# Patient Record
Sex: Male | Born: 1990 | Race: Black or African American | Hispanic: No | Marital: Single | Smoking: Current every day smoker
Health system: Southern US, Community
[De-identification: ages and names within clinical notes are randomized; demographics above are authoritative.]

## PROBLEM LIST (undated history)

## (undated) DIAGNOSIS — F419 Anxiety disorder, unspecified: Secondary | ICD-10-CM

## (undated) DIAGNOSIS — F1910 Other psychoactive substance abuse, uncomplicated: Secondary | ICD-10-CM

## (undated) DIAGNOSIS — F32A Depression, unspecified: Secondary | ICD-10-CM

## (undated) HISTORY — DX: Anxiety disorder, unspecified: F41.9

## (undated) HISTORY — DX: Depression, unspecified: F32.A

## (undated) HISTORY — DX: Other psychoactive substance abuse, uncomplicated: F19.10

---
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
## 2010-12-22 LAB — COMPREHENSIVE METABOLIC PANEL
ALT: 15 U/L (ref 0–53)
AST: 19 U/L (ref 0–37)
Albumin: 4.7 g/dL (ref 3.5–5.2)
Alkaline Phosphatase: 63 U/L (ref 39–117)
BUN: 10 mg/dL (ref 6–23)
CO2: 30 mEq/L (ref 19–32)
Calcium: 9.7 mg/dL (ref 8.4–10.5)
Chloride: 102 mEq/L (ref 96–112)
Creatinine, Ser: 1.17 mg/dL (ref 0.4–1.5)
GFR calc Af Amer: >60 mL/min (ref >60)
GFR calc non Af Amer: >60 mL/min (ref >60)
Glucose, Bld: 97 mg/dL (ref 70–99)
Potassium: 3.2 mEq/L — ABNORMAL LOW (ref 3.5–5.1)
Sodium: 140 mEq/L (ref 135–145)
Total Bilirubin: 1.5 mg/dL — ABNORMAL HIGH (ref 0.3–1.2)
Total Protein: 7.7 g/dL (ref 6.0–8.3)

## 2010-12-22 LAB — CBC
HCT: 49.3 % (ref 39.0–52.0)
Hemoglobin: 17.2 g/dL — ABNORMAL HIGH (ref 13.0–17.0)
MCHC: 34.9 g/dL (ref 30.0–36.0)
MCV: 94.4 fL (ref 78.0–100.0)
Platelets: 200 10*3/uL (ref 150–400)
RBC: 5.22 MIL/uL (ref 4.22–5.81)
RDW: 12.8 % (ref 11.5–15.5)
WBC: 5.7 10*3/uL (ref 4.0–10.5)

## 2010-12-22 LAB — TSH: TSH: 1.221 u[IU]/mL (ref 0.350–4.500)

## 2011-01-25 NOTE — H&P (Signed)
NAMEDEONTAE, ROBSON NO.:  [id number]   MEDICAL RECORD NO.:  [id number]          PATIENT TYPE:  IPS   LOCATION:  0508                          FACILITY:  [facility]   PHYSICIAN:  Anselm Jungling, MD  DATE OF BIRTH:  [birth year]   DATE OF ADMISSION:  12/25/2008  DATE OF DISCHARGE:                       PSYCHIATRIC ADMISSION ASSESSMENT   This is a voluntary admission to the services of Dr. Geralyn Flash.   This is an 20 year old single [place] American male.  He had his first  session with a therapist on Thursday.  During his therapy session, he  reported that he had been suffering from depression ever since he had to  leave his college due to a concussion from a football injury back in  October.  He had now developed suicidal ideation, to include a plan to  hang himself.  Apparently, the therapist recommended that he come over  here for an assessment, and it was felt that it would be in his best  interest to be admitted.  He does readily admit to using marijuana.  He  denies any other drug use.   PAST PSYCHIATRIC HISTORY:  He does not have any.   SOCIAL HISTORY:  He was a Land at [employer] up in  [place], due to a concussion suffered on October 15th.  He had to leave  school, quit playing football.  He is now enrolled at [employer].  He reports  that he is unfocused, dizzy, and he feels that it is from his  concussion.   FAMILY HISTORY:  The maternal side of his family, all people have  depression.   ALCOHOL/DRUG HISTORY:  He admits to occasional alcohol on weekends and  uses marijuana.   PRIMARY CARE Aquarius Tremper:  Deirdre Peer. Polite, M.D.   MEDICAL PROBLEMS:  He reports post-concussive syndrome.   MEDICATIONS:  He does not have any.   DRUG ALLERGIES:  No known drug allergies.   PHYSICAL EXAMINATION:  A well-developed, well-nourished 20 year old  male.  VITAL SIGNS ON ADMISSION:  He is 68 inches tall.  He weighs 158.  Temperature is 97.5, blood  pressure 134/87, pulse 55.   He does not have any positives in review of systems other than feeling  dizzy and unfocused since his concussion.  He is now depressed about not  being able to play football.   MENTAL STATUS EXAM:  He is alert and oriented.  He is appropriately  groomed, dressed, and nourished.  His speech has a normal rate, rhythm,  and tone.  His mood is appropriate to the situation.  His affect had a  full range.  Thought processes are fairly clear, rational, and goal-  oriented, although he is requesting discharge.  Judgment and insight are  fair.  Constitution and memory are intact.  Intelligence is average.  He  is not actively suicidal at this moment.  He is not homicidal.  He has  not had auditory or visual hallucinations.   DIAGNOSES:   AXIS I:  Major depressive disorder, single, severe, without psychotic  features.   AXIS II:  Deferred.   AXIS III:  Status post concussion, October 15th.  He has had other ones,  but this one was apparently the worst.   AXIS IV:  Severe.   AXIS V:  20.   PLAN:  Admit for safety and stabilization to assess the need for  medication and to set up post-discharge support regarding therapy and  medications.   ESTIMATED LENGTH OF STAY:  3 to 5 days.      Mickie Leonarda Salon, P.A.-C.      Anselm Jungling, MD  Electronically Signed    MD/MEDQ  D:  12/26/2008  T:  12/27/2008  Job:  [id number]

## 2011-01-28 NOTE — Discharge Summary (Signed)
Xavier Ramsey, Xavier Ramsey NO.:  [id number]   MEDICAL RECORD NO.:  [id number]          PATIENT TYPE:  IPS   LOCATION:  0508                          FACILITY:  [facility]   PHYSICIAN:  Jasmine Pang, M.D. DATE OF BIRTH:  November 20, 1990   DATE OF ADMISSION:  12/25/2008  DATE OF DISCHARGE:  12/27/2008                               DISCHARGE SUMMARY   IDENTIFICATION:  This is an 20 year old single African American male,  who was admitted on a voluntary basis on December 25, 2008.   HISTORY OF PRESENT ILLNESS:  The patient had his first session with a  therapist on the day before admission.  During his therapy session, he  reported he had been suffering from depression ever since he had to  leave his college due to a concussion from a football injury back in  October 2009.  He had now developed suicidal ideation including a plan  to hang himself.  The therapist recommended that he come over to our  hospital for assessment and admission.  For further admission  information, see psychiatric admission assessment.   HOSPITAL COURSE:  Upon admission, the patient was ordered a CBC without  differential.  This was within normal limits except for hemoglobin  elevated at 17.2.  Routine chemistry profile was within normal limits  except for potassium of 3.2.  Hepatic profile was within normal limits  except for slightly elevated total bilirubin of 1.5 (0.3-1.2).  TSH was  1.221 (0.35-4.5).  In individual sessions, the patient was a well-  developed, well-nourished male, who was alert and oriented x4.  He was  pleasant, polite, reserved.  He admits to suicidal ideation prior to  admission.  He denied this now.  He stated he was depressed and wanted  help.  He indicated it was okay to contact his mother.  His mother was  concerned about who the patient was going to see once discharged.  She  felt he would do better with a male therapist.  She seemed very  supportive.  On December 27, 2008, mental  status had improved markedly from  admission status.  Sleep was good.  Appetite was good.  Mood was  euthymic.  Affect consistent with mood, wide range with no suicidal or  homicidal ideation.  No thoughts of self-injurious behavior.  No  auditory or visual hallucinations.  No paranoia or delusions.  Thoughts  were logical and goal-directed.  Thought content, no predominant theme.  Cognitive was grossly intact.  Insight good.  Judgment good.  Impulse  control good.  The patient had a family session with his parents today.  Main concerns addressed were discharge planning.  The patient's father  is visiting from out of town and agreed to stay as long as needed to  help offer extra support to the patient.  Both parents were very  supportive and felt comfortable with him going home today.  The patient  stated he did not feel suicidal.  Family did not feel he would harm  himself or others upon returning home.   DISCHARGE DIAGNOSES:  Axis I:  Major depressive  disorder, single, severe  without psychotic features.  Axis II:  No diagnosis.  Axis III:  Status post concussion on June 26, 2008.  Axis IV:  Severe (problems with primary support group, other  psychosocial problems, burden of psychiatric illness, burden of  concussion).  Axis V:  Global assessment of functioning was 50 upon discharge.  Global  assessment of functioning was 20 upon admission.  Global assessment of  functioning highest past year was 65.   DISCHARGE PLAN:  There was no specific activity level or dietary  restrictions.   POSTHOSPITAL CARE PLANS:  The patient will see Vivi Martens at [facility] on December 29, 2008.   DISCHARGE MEDICATIONS:  None.      Jasmine Pang, M.D.  Electronically Signed     BHS/MEDQ  D:  01/14/2009  T:  01/15/2009  Job:  [id number]

## 2012-06-04 ENCOUNTER — Ambulatory Visit: Payer: Self-pay | Admitting: Licensed Clinical Social Worker

## 2012-06-25 ENCOUNTER — Ambulatory Visit (INDEPENDENT_AMBULATORY_CARE_PROVIDER_SITE_OTHER): Payer: 59 | Admitting: Licensed Clinical Social Worker

## 2012-06-25 DIAGNOSIS — F4323 Adjustment disorder with mixed anxiety and depressed mood: Secondary | ICD-10-CM

## 2014-05-27 ENCOUNTER — Other Ambulatory Visit: Payer: Self-pay | Admitting: Internal Medicine

## 2014-05-27 DIAGNOSIS — R1310 Dysphagia, unspecified: Secondary | ICD-10-CM

## 2014-05-29 ENCOUNTER — Ambulatory Visit
Admission: RE | Admit: 2014-05-29 | Discharge: 2014-05-29 | Disposition: A | Discharge status: Home / Self Care | Payer: 59 | Source: Ambulatory Visit | Attending: Internal Medicine | Admitting: Internal Medicine

## 2014-05-29 DIAGNOSIS — R1310 Dysphagia, unspecified: Secondary | ICD-10-CM

## 2014-08-26 ENCOUNTER — Ambulatory Visit (INDEPENDENT_AMBULATORY_CARE_PROVIDER_SITE_OTHER): Payer: 59 | Admitting: Licensed Clinical Social Worker

## 2014-08-26 DIAGNOSIS — F331 Major depressive disorder, recurrent, moderate: Secondary | ICD-10-CM

## 2014-09-02 ENCOUNTER — Ambulatory Visit (INDEPENDENT_AMBULATORY_CARE_PROVIDER_SITE_OTHER): Payer: 59 | Admitting: Licensed Clinical Social Worker

## 2014-09-02 DIAGNOSIS — F331 Major depressive disorder, recurrent, moderate: Secondary | ICD-10-CM

## 2014-09-16 ENCOUNTER — Ambulatory Visit: Payer: 59 | Admitting: Licensed Clinical Social Worker

## 2015-03-30 IMAGING — RF DG ESOPHAGUS
14 of 17 series · 19 of 24 positions shown · non-contrast
Comparison: None.

CLINICAL DATA: Dysphagia

EXAM:
ESOPHOGRAM / BARIUM SWALLOW / BARIUM TABLET STUDY
TECHNIQUE: Combined double contrast and single contrast examination performed
using effervescent crystals, thick barium liquid, and thin barium
liquid. The patient was observed with fluoroscopy swallowing a 13mm
barium sulphate tablet.
FLUOROSCOPY TIME:  1 min 18 seconds

[Series 2: run · 1 of 1 slices shown (1 of 14)]
[im 1/1]
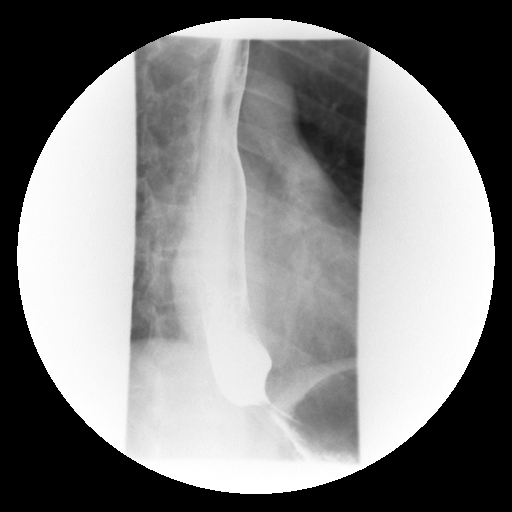

[Series 3: run · 1 of 1 slices shown (2 of 14)]
[im 1/1]
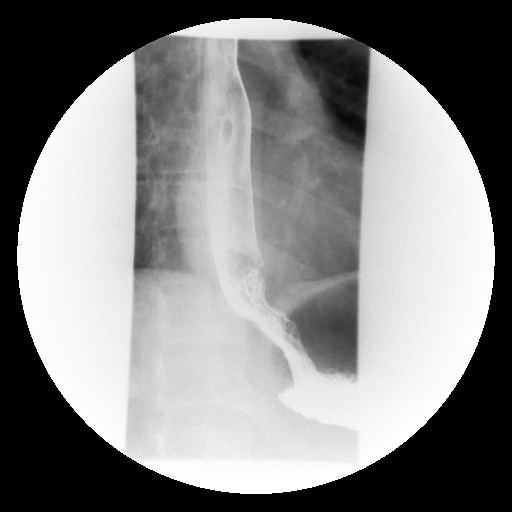

[Series 5: run · 1 of 1 slices shown (3 of 14)]
[im 1/1]
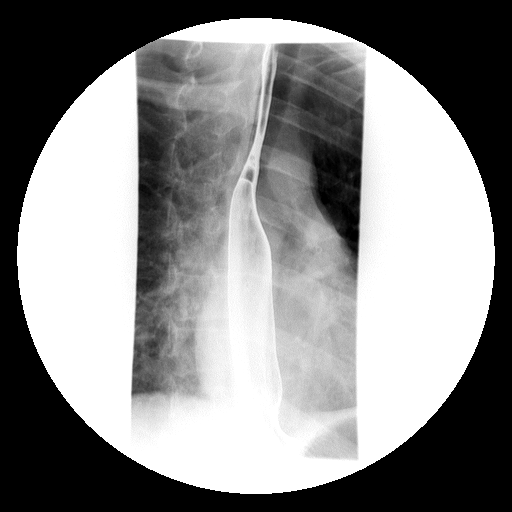

[Series 6: run · 1 of 1 slices shown (4 of 14)]
[im 1/1]
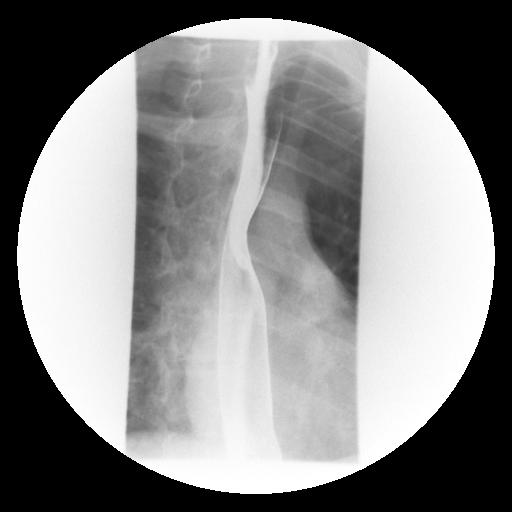

[Series 7: run · 1 of 1 slices shown (5 of 14)]
[im 1/1]
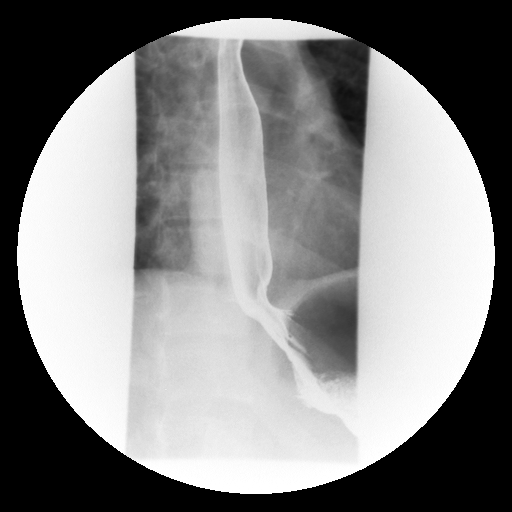

[Series 8: run · 2 of 8 slices shown (6 of 14)]
[im 1/8]
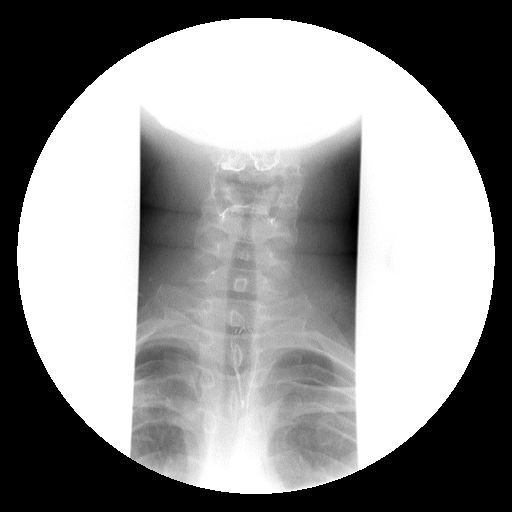
[im 8/8]
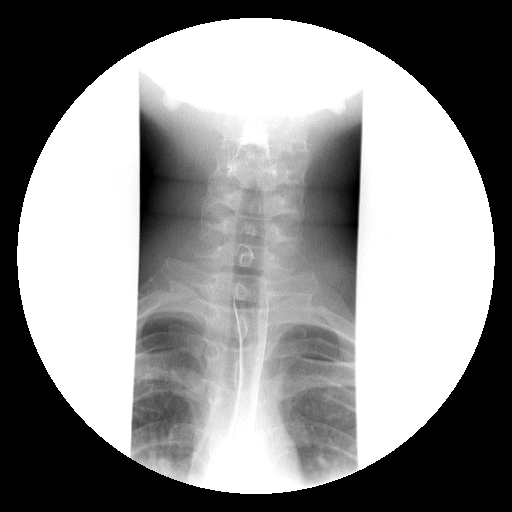

[Series 9: run · 2 of 7 slices shown (7 of 14)]
[im 1/7]
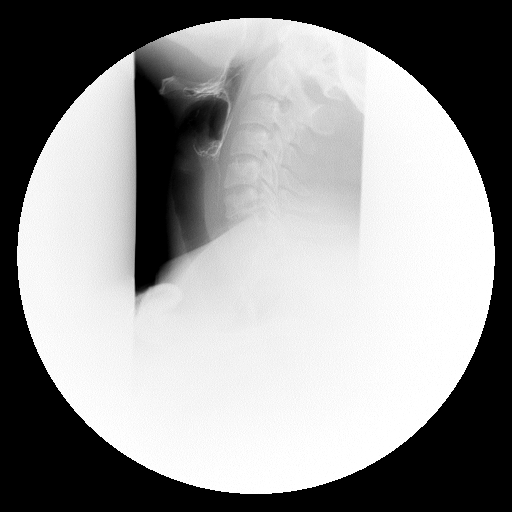
[im 4/7]
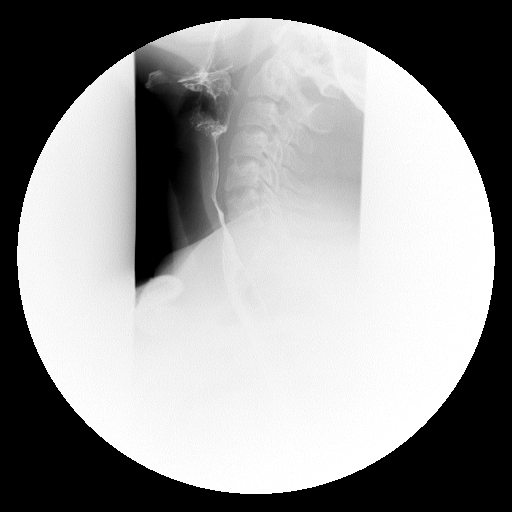

[Series 10: run · 4 of 8 slices shown (8 of 14)]
[im 1/8]
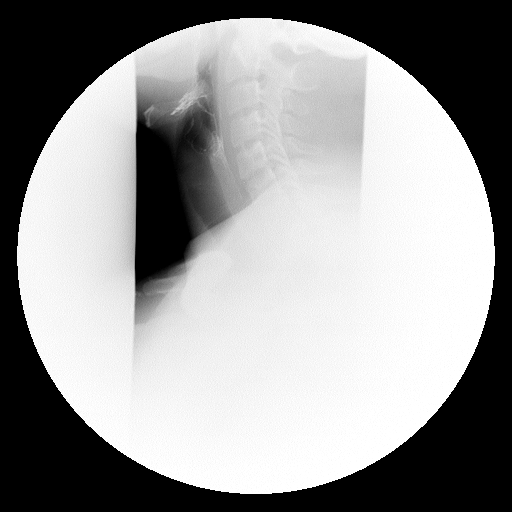
[im 3/8]
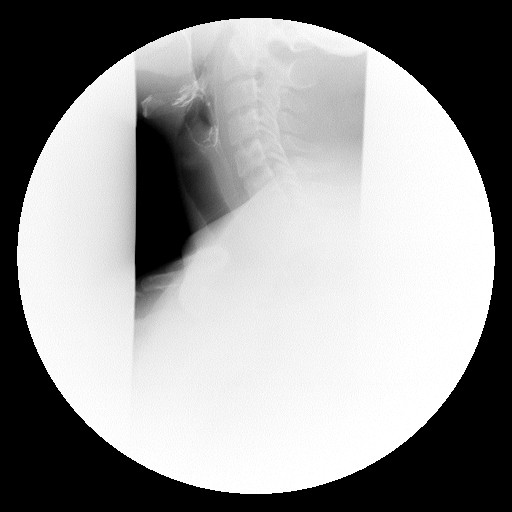
[im 5/8]
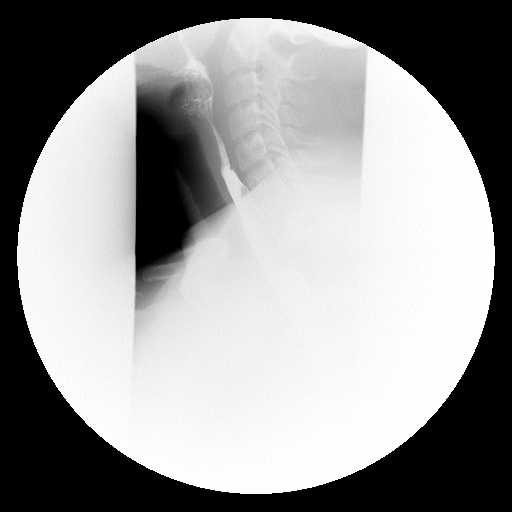
[im 8/8]
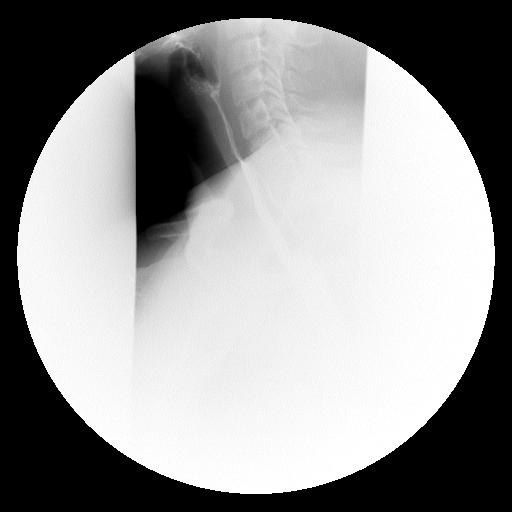

[Series 12: run · 1 of 1 slices shown (9 of 14)]
[im 1/1]
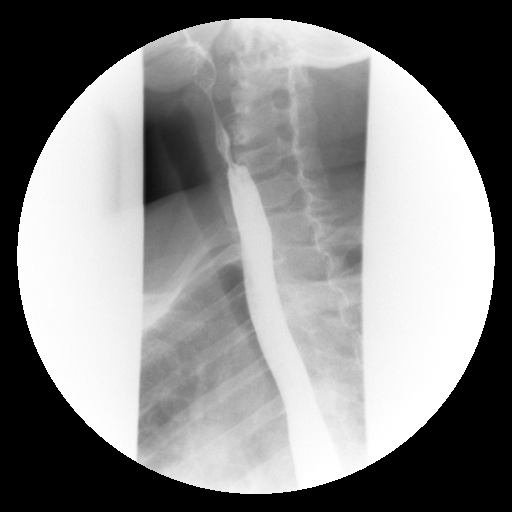

[Series 13: run · 1 of 1 slices shown (10 of 14)]
[im 1/1]
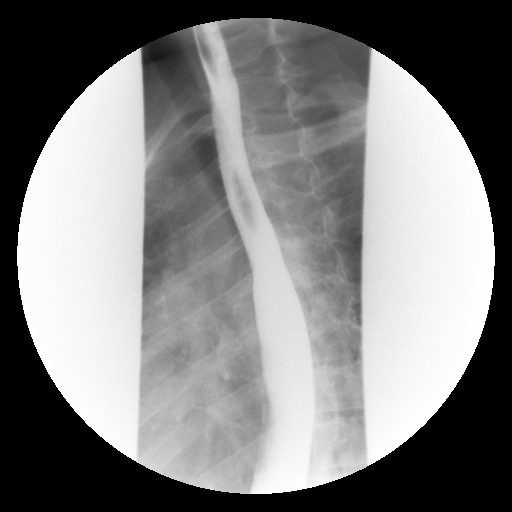

[Series 14: run · 1 of 1 slices shown (11 of 14)]
[im 1/1]
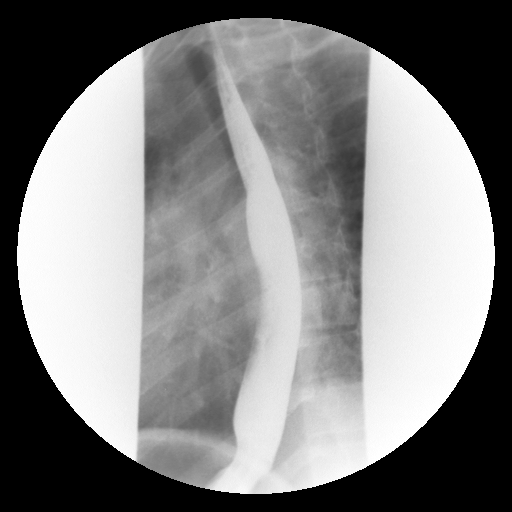

[Series 15: run · 1 of 1 slices shown (12 of 14)]
[im 1/1]
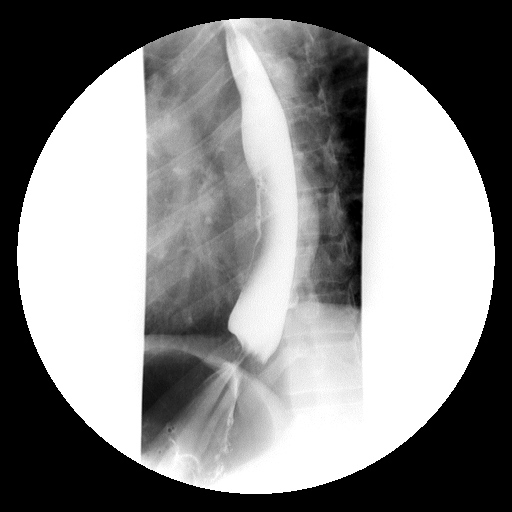

[Series 18: run · 1 of 1 slices shown (13 of 14)]
[im 1/1]
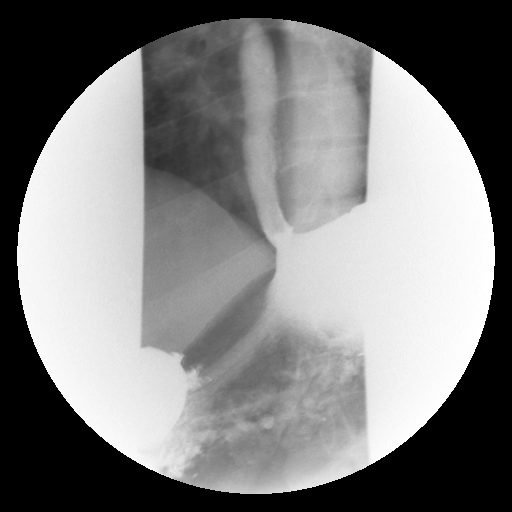

[Series 19: run · 1 of 1 slices shown (14 of 14)]
[im 1/1]
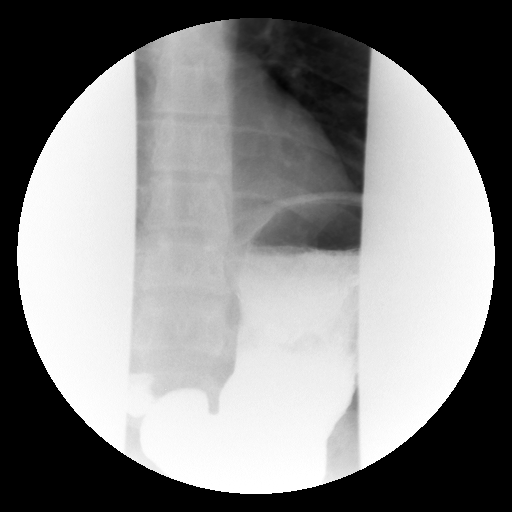

[19 of 24 positions shown; findings below may reference images not displayed]

FINDINGS: Initially a double-contrast barium swallow was performed. The mucosa
of the esophagus is unremarkable. A single contrast study shows the
swallowing mechanism to be normal. There is slight prominence of the
cricopharyngeus muscle noted. There is very minimal indentation upon
the lower cervical esophagus from the left of midline which could be
normal, but a left thyroid nodule or asymmetrical enlargement could
create a similar appearance.

Esophageal peristalsis is normal. No hiatal hernia is seen. Moderate
gastroesophageal reflux is demonstrated. A barium pill was given at
the end of the study which did pass into the stomach without delay.
IMPRESSION: 1. Moderate gastroesophageal reflux. No hiatal hernia. Barium pill
passes into the stomach without delay.
2. Slight prominence of the cricopharyngeus muscle.
3. Very mild indentation upon the lower cervical esophagus from the
left. Cannot exclude asymmetrical enlargement of the left lobe of
thyroid versus left thyroid nodule. Correlate clinically.

## 2020-10-21 ENCOUNTER — Ambulatory Visit (INDEPENDENT_AMBULATORY_CARE_PROVIDER_SITE_OTHER): Payer: Self-pay | Admitting: Internal Medicine

## 2020-10-21 ENCOUNTER — Other Ambulatory Visit: Payer: Self-pay

## 2020-10-21 VITALS — BP 130/84 | HR 78 | Temp 98.3°F | Resp 16 | Ht 69.0 in | Wt 160.0 lb

## 2020-10-21 DIAGNOSIS — Z7689 Persons encountering health services in other specified circumstances: Secondary | ICD-10-CM

## 2020-10-21 DIAGNOSIS — Z114 Encounter for screening for human immunodeficiency virus [HIV]: Secondary | ICD-10-CM

## 2020-10-21 DIAGNOSIS — Z87820 Personal history of traumatic brain injury: Secondary | ICD-10-CM

## 2020-10-21 DIAGNOSIS — Z13228 Encounter for screening for other metabolic disorders: Secondary | ICD-10-CM

## 2020-10-21 DIAGNOSIS — R413 Other amnesia: Secondary | ICD-10-CM

## 2020-10-21 DIAGNOSIS — Z130 Encounter for screening for diseases of the blood and blood-forming organs and certain disorders involving the immune mechanism: Secondary | ICD-10-CM

## 2020-10-21 DIAGNOSIS — R2689 Other abnormalities of gait and mobility: Secondary | ICD-10-CM

## 2020-10-21 DIAGNOSIS — Z1159 Encounter for screening for other viral diseases: Secondary | ICD-10-CM

## 2020-10-21 NOTE — Progress Notes (Signed)
Subjective:    Xavier Ramsey - 30 y.o. male MRN [id number]  Date of birth: [birth year]  HPI  Xavier Ramsey is to establish care. Reports was previously going to [facility] but has not been in several years. Patient has a PMH significant for none. No surgical history. No medication use.   Reports that had several concussions between 2006-2009 from playing football. Endorses significant memory issues---forgets tasks he is supposed. Has trouble with balance problems. No focal weakness, trouble with speech. No headaches.    ROS per HPI   Health Maintenance:  Health Maintenance Due  Topic Date Due   Hepatitis C Screening  Never done    Past Medical History: There are no problems to display for this patient.   Social History   reports that he has never smoked. He has never used smokeless tobacco. He reports current alcohol use. He reports current drug use. Drug: Marijuana.   Family History  family history is not on file.   Medications: reviewed and updated   Objective:   Physical Exam BP 130/84 (BP Location: Right Arm, Patient Position: Sitting, Cuff Size: Normal)    Pulse 78    Temp 98.3 F (36.8 C) (Oral)    Resp 16    Ht 5\' 9"  (1.753 m)    Wt 160 lb (72.6 kg)    SpO2 98%    BMI 23.63 kg/m  Physical Exam Constitutional:      General: He is not in acute distress.    Appearance: He is not diaphoretic.  HENT:     Head: Normocephalic and atraumatic.     Mouth/Throat:     Mouth: Mucous membranes are moist.     Pharynx: No oropharyngeal exudate.  Eyes:     Extraocular Movements: Extraocular movements intact and EOM normal.     Conjunctiva/sclera: Conjunctivae normal.     Pupils: Pupils are equal, round, and reactive to light.  Cardiovascular:     Rate and Rhythm: Normal rate and regular rhythm.     Heart sounds: Normal heart sounds. No murmur heard.   Pulmonary:     Effort: Pulmonary effort is normal. No respiratory distress.     Breath sounds: Normal breath sounds.   Musculoskeletal:        General: Normal range of motion.  Skin:    General: Skin is warm and dry.  Neurological:     Mental Status: He is alert and oriented to person, place, and time.     Cranial Nerves: No cranial nerve deficit.     Motor: No weakness.     Gait: Gait normal.  Psychiatric:        Mood and Affect: Affect normal.        Judgment: Judgment normal.         Assessment & Plan:   1. Encounter to establish care Reviewed patient's PMH, social history, surgical history, and medications.   2. Screening for metabolic disorder - Comprehensive metabolic panel - Lipid panel  3. Screening for deficiency anemia - CBC  4. Need for hepatitis C screening test - Hepatitis C antibody  5. Screening for HIV (human immunodeficiency virus) - HIV Antibody (routine testing w rflx)  6. H/O multiple concussions 7. Balance problem 8. Memory loss Patient with multiple concussions related to injuries while playing contact sports. Only prior brain imaging available for review is CT head 2006 which was negative. Given his history and sequale of symptoms, am concerned for TBI. Will refer to neurology for  further work up. Would suspect warrants additional imaging. Neuro exam is benign and non-acute at visit today.  - Ambulatory referral to Neurology     Phill Myron, D.O. 10/21/2020, 3:03 PM Primary Care at [facility]

## 2020-10-22 LAB — LIPID PANEL
Chol/HDL Ratio: 2.7 ratio (ref 0.0–5.0)
Cholesterol, Total: 182 mg/dL (ref 100–199)
HDL: 67 mg/dL (ref >39)
LDL Chol Calc (NIH): 88 mg/dL (ref 0–99)
Triglycerides: 158 mg/dL — ABNORMAL HIGH (ref 0–149)
VLDL Cholesterol Cal: 27 mg/dL (ref 5–40)

## 2020-10-22 LAB — CBC
Hematocrit: 44.1 % (ref 37.5–51.0)
Hemoglobin: 15.5 g/dL (ref 13.0–17.7)
MCH: 33.8 pg — ABNORMAL HIGH (ref 26.6–33.0)
MCHC: 35.1 g/dL (ref 31.5–35.7)
MCV: 96 fL (ref 79–97)
Platelets: 286 10*3/uL (ref 150–450)
RBC: 4.58 x10E6/uL (ref 4.14–5.80)
RDW: 12.2 % (ref 11.6–15.4)
WBC: 6 10*3/uL (ref 3.4–10.8)

## 2020-10-22 LAB — COMPREHENSIVE METABOLIC PANEL
ALT: 39 IU/L (ref 0–44)
AST: 31 IU/L (ref 0–40)
Albumin/Globulin Ratio: 1.7 (ref 1.2–2.2)
Albumin: 4.5 g/dL (ref 4.1–5.2)
Alkaline Phosphatase: 65 IU/L (ref 44–121)
BUN/Creatinine Ratio: 5 — ABNORMAL LOW (ref 9–20)
BUN: 7 mg/dL (ref 6–20)
Bilirubin Total: 0.7 mg/dL (ref 0.0–1.2)
CO2: 26 mmol/L (ref 20–29)
Calcium: 9.3 mg/dL (ref 8.7–10.2)
Chloride: 102 mmol/L (ref 96–106)
Creatinine, Ser: 1.31 mg/dL — ABNORMAL HIGH (ref 0.76–1.27)
GFR calc Af Amer: 84 mL/min/{1.73_m2} (ref >59)
GFR calc non Af Amer: 73 mL/min/{1.73_m2} (ref >59)
Globulin, Total: 2.7 g/dL (ref 1.5–4.5)
Glucose: 72 mg/dL (ref 65–99)
Potassium: 4.5 mmol/L (ref 3.5–5.2)
Sodium: 145 mmol/L — ABNORMAL HIGH (ref 134–144)
Total Protein: 7.2 g/dL (ref 6.0–8.5)

## 2020-10-22 LAB — HIV ANTIBODY (ROUTINE TESTING W REFLEX): HIV Screen 4th Generation wRfx: NONREACTIVE

## 2020-10-22 LAB — HEPATITIS C ANTIBODY: Hep C Virus Ab: <0.1 s/co ratio (ref 0.0–0.9)

## 2020-10-26 ENCOUNTER — Other Ambulatory Visit: Payer: Self-pay

## 2020-10-26 NOTE — Progress Notes (Signed)
Erroneous encounter

## 2020-10-27 ENCOUNTER — Encounter: Payer: Self-pay | Admitting: Psychology

## 2020-11-12 ENCOUNTER — Encounter (HOSPITAL_COMMUNITY): Payer: Self-pay | Admitting: Psychiatry

## 2020-11-12 ENCOUNTER — Other Ambulatory Visit: Payer: Self-pay

## 2020-11-12 ENCOUNTER — Ambulatory Visit (INDEPENDENT_AMBULATORY_CARE_PROVIDER_SITE_OTHER): Payer: No Payment, Other | Admitting: Psychiatry

## 2020-11-12 VITALS — BP 121/94 | HR 78 | Ht 67.5 in | Wt 164.0 lb

## 2020-11-12 DIAGNOSIS — F0630 Mood disorder due to known physiological condition, unspecified: Secondary | ICD-10-CM | POA: Diagnosis not present

## 2020-11-12 DIAGNOSIS — S0990XS Unspecified injury of head, sequela: Secondary | ICD-10-CM | POA: Insufficient documentation

## 2020-11-12 MED ORDER — CITALOPRAM HYDROBROMIDE 20 MG PO TABS: ORAL_TABLET | ORAL | 1 refills | Status: DC

## 2020-11-12 NOTE — Progress Notes (Signed)
Psychiatric Initial Adult Assessment   Patient Identification: Xavier Ramsey MRN:  [id number] Date of Evaluation:  11/12/2020   Referral Source: Self  Chief Complaint:  " My mom thinks I have problems with my mood."  Visit Diagnosis:    ICD-10-CM   1. 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  Patient reported that when he was in high school he was active in contact sports and suffered several concussions while playing football.  He stated that he had a very severe concussion when he was a senior in high school and he stopped playing completely around 2009.  He stated that when he was younger he was informed that he should not be playing anymore because of the severity of the numerous concussions he had suffered. Patient reported over the past few years his mother has noted that patient is more irritable and moody at times.  He stated that she feels that he gets impatient easily and loses his temper. He stated that since he graduated from high school he has noticed that over time he has started to develop memory issues as he cannot remember things.  He also has hard time completing tasks in a timely fashion. He stated that he has noted lately that he cries easily for no reason.  He sometimes may feel sad but denied feeling overly depressed.  He reported that sometimes he feels tired but denied feeling loss of energy. He reported feeling distracted and having difficulty with concentration.  He has been eating well.  He has a hard time falling asleep. He denied having any suicidal ideations.  He denied any prior suicide attempts. He denied any symptoms suggestive of mania or hypomania. He denied any history of road rages or aggressive outburst resulting in legal involvement. He denied any history of aggressive impulsivity.  He  was evaluated by his PCP last month and was referred to neurology due to history of numerous concussions in the past.  Patient stated that his mother wanted him to be seen by psychiatry due to his mood changes.  Based on his assessment he displays mild irritability and frequent crying spells. He does not meet criteria for pseudobulbar affect.  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  He was agreeable to trial of low-dose of citalopram to target his mood. Potential side effects of medication and risks vs benefits of treatment vs non-treatment were explained and discussed. All questions were answered.    Past Psychiatric History: denied  Previous Psychotropic Medications: No   Substance Abuse History in the last 12 months:  No.  Consequences of Substance Abuse: NA  Past Medical History:  Past Medical History:  Diagnosis Date  . Anxiety    Phreesia 10/21/2020  . Substance abuse ([place])    Phreesia 10/21/2020     Family Psychiatric History: denied   Family History: No family history on file.  Social History:   Social History   Socioeconomic History  . Marital status: Single    Spouse name: Not on file  . Number of children: Not on file  . Years of education: Not on file  . Highest education level: Not on file  Occupational History  . Not on file  Tobacco Use  . Smoking status: Never Smoker  . Smokeless  tobacco: Never Used  Vaping Use  . Vaping Use: Never used  Substance and Sexual Activity  . Alcohol use: Yes    Comment: weekends  . Drug use: Yes    Types: Marijuana  . Sexual activity: Not on file  Other Topics Concern  . Not on file  Social History Narrative  . Not on file   Social Determinants of Health   Financial Resource Strain: Not on file  Food Insecurity: Not on file  Transportation Needs: Not on file  Physical Activity: Not on file  Stress: Not on file  Social Connections: Not on file     Additional Social History: Lives with girlfriend, 60-year-old son and girlfriend's child.  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  Allergies:  No Known Allergies  Metabolic Disorder Labs: No results found for: HGBA1C, MPG No results found for: PROLACTIN Lab Results  Component Value Date   CHOL 182 10/21/2020   TRIG 158 (H) 10/21/2020   HDL 67 10/21/2020   CHOLHDL 2.7 10/21/2020   LDLCALC 88 10/21/2020     Therapeutic Level Labs: No results found for: LITHIUM No results found for: CBMZ No results found for: VALPROATE  Current Medications: Current Outpatient Medications  Medication Sig Dispense Refill  . citalopram (CELEXA) 20 MG tablet Take half tablet daily for 1 week then take 1 tablet daily 30 tablet 1   No current facility-administered medications for this visit.    Musculoskeletal: Strength & Muscle Tone: within normal limits Gait & Station: normal Patient leans: N/A  Psychiatric Specialty Exam: Review of Systems  Blood pressure (!) 121/94, pulse 78, height 5' 7.5" (1.715 m), weight 164 lb (74.4 kg), SpO2 100 %.Body mass index is 25.31 kg/m.  General Appearance: Fairly Groomed  Eye Contact:  Good  Speech:  Clear and Coherent and Normal Rate  Volume:  Normal  Mood:  Euthymic  Affect:  Congruent  Thought Process:  Goal Directed and Descriptions of Associations: Intact  Orientation:  Full (Time, Place, and Person)  Thought Content:  Logical  Suicidal Thoughts:  No  Homicidal Thoughts:  No  Memory:  Immediate;   Good Recent;   Good  Judgement:  Fair  Insight:  Fair  Psychomotor Activity:  Normal  Concentration:  Concentration: Good and Attention Span: Good  Recall:  Good  Fund of Knowledge:Good  Language: Good  Akathisia:  Negative  Handed:  Right  AIMS (if indicated):  0  Assets:  Communication Skills [employer] [place] Talents/Skills Transportation Vocational/Educational  ADL's:  Intact  Cognition:  WNL  Sleep:  Fair   Screenings: GAD-7   [place] Office Visit from 10/21/2020 in Primary Care at [facility]  Total GAD-7 Score 0    PHQ2-9   [place] Office Visit from 11/12/2020 in [facility] Office Visit from 10/21/2020 in Primary Care at [facility] Total Score 0 0  PHQ-9 Total Score 4 0    Flowsheet Row Office Visit from 11/12/2020 in [place] No Risk      Assessment and Plan: Based on patient's history and evaluation he meets her here for mood disorder secondary to old head injury (concussions).  He is agreeable to trying a low-dose of citalopram to help with his mood. Potential side effects of medication and risks vs benefits of treatment vs non-treatment were explained and discussed. All questions were answered.   1. Mood disorder due to old  head injury  - citalopram (CELEXA) 20 MG tablet; Take half tablet daily for 1 week then take 1 tablet daily  Dispense: 30 tablet; Refill: 1   Follow-up in 2 months. Patient has been referred to neurology by PCP.  He was advised to keep that appointment.  [place]ada Crane, MD 3/3/20221:47 PM

## 2020-12-03 ENCOUNTER — Ambulatory Visit (HOSPITAL_COMMUNITY): Payer: No Payment, Other | Admitting: Psychiatry

## 2020-12-15 ENCOUNTER — Encounter: Payer: Self-pay | Admitting: Psychology

## 2020-12-16 ENCOUNTER — Ambulatory Visit: Payer: Self-pay

## 2020-12-21 ENCOUNTER — Ambulatory Visit: Payer: Self-pay | Attending: Internal Medicine

## 2020-12-21 ENCOUNTER — Other Ambulatory Visit: Payer: Self-pay

## 2020-12-22 ENCOUNTER — Encounter: Payer: Self-pay | Admitting: Psychology

## 2020-12-28 ENCOUNTER — Ambulatory Visit (INDEPENDENT_AMBULATORY_CARE_PROVIDER_SITE_OTHER): Payer: Self-pay | Admitting: Primary Care

## 2020-12-30 ENCOUNTER — Ambulatory Visit: Payer: Self-pay | Attending: Internal Medicine

## 2020-12-30 ENCOUNTER — Ambulatory Visit: Payer: Medicaid Other

## 2020-12-30 ENCOUNTER — Other Ambulatory Visit: Payer: Self-pay

## 2021-01-01 ENCOUNTER — Telehealth: Payer: Self-pay | Admitting: Internal Medicine

## 2021-01-01 NOTE — Telephone Encounter (Signed)
I return call, I LVM inform that I received all the documents on 12/30/20 that will take 3 weeks minimus to be review his application I can't tell them exaltly the day since is [facility] who review the application

## 2021-01-01 NOTE — Telephone Encounter (Signed)
Copied from [place] [id number]. Topic: General - Other >> Jan 01, 2021  9:04 AM Tessa Lerner A wrote: Reason for CRM: Patient's mother would like to speak with Mr. Clifton James from financial counseling when possible  Patient's mother would like to know the exact approval date so that an appointment for the patient can be made  Patient's mother had additional questions related to billing as well.  Please contact to further advise when possible

## 2021-01-07 ENCOUNTER — Ambulatory Visit (HOSPITAL_COMMUNITY): Payer: No Payment, Other | Admitting: Psychiatry

## 2021-01-11 ENCOUNTER — Telehealth: Payer: Self-pay | Admitting: Internal Medicine

## 2021-01-11 NOTE — Telephone Encounter (Signed)
Copied from [place] [id number]. Topic: General - Other >> Jan 11, 2021  8:56 AM Keene Breath wrote: Reason for CRM: Patient's mother called and has a few questions for [facility] regarding financial assistance.  Please call to discuss further [phone] [facility])

## 2021-01-12 ENCOUNTER — Other Ambulatory Visit: Payer: Self-pay

## 2021-01-12 ENCOUNTER — Encounter: Payer: Self-pay | Admitting: Family

## 2021-01-12 ENCOUNTER — Ambulatory Visit (INDEPENDENT_AMBULATORY_CARE_PROVIDER_SITE_OTHER): Payer: Self-pay | Admitting: Family

## 2021-01-12 VITALS — BP 133/90 | HR 67 | Wt 160.0 lb

## 2021-01-12 DIAGNOSIS — T22111A Burn of first degree of right forearm, initial encounter: Secondary | ICD-10-CM

## 2021-01-12 DIAGNOSIS — D229 Melanocytic nevi, unspecified: Secondary | ICD-10-CM

## 2021-01-12 MED ORDER — BACITRACIN 500 UNIT/GM EX OINT: 1.0000 "application " | TOPICAL_OINTMENT | Freq: Two times a day (BID) | CUTANEOUS | 0 refills | Status: DC

## 2021-01-12 NOTE — Progress Notes (Signed)
Mole on left side of stomach start off small now larger tender to touch

## 2021-01-12 NOTE — Patient Instructions (Addendum)
Referral to Dermatology.   Antibiotic ointment for burn.   Follow-up with primary provider as needed.  Mole A mole is a colored (pigmented) growth on the skin. Moles are very common. They are usually harmless, but some moles can become cancerous over time. What are the causes? Moles are caused when pigmented skin cells grow together in clusters instead of spreading out in the skin as they normally do. The reason why the skin cells grow together in clusters is not known. What increases the risk? You are more likely to develop a mole if you:  Have family members who have moles.  Are white.  Have blond hair.  Are often outdoors and exposed to the sun.  Received phototherapy when you were a newborn baby.  Are male. What are the signs or symptoms? A mole may be:  [place] or black.  Flat or raised.  Smooth or wrinkled.   How is this diagnosed? A mole is diagnosed with a skin exam. If your health care provider thinks a mole may be cancerous, all or part of the mole will be removed for testing (biopsy). How is this treated? Most moles are noncancerous (benign) and do not require treatment. If a mole is found to be cancerous, it will be removed. You may also choose to have a mole removed if it is causing pain or if you do not like the way it looks. Follow these instructions at home: General instructions  Every month, look for new moles and check your existing moles for changes. This is important because a change in a mole can mean that the mole has become cancerous.  ABCDE changes in a mole indicate that you should be evaluated by your health care provider. ABCDE stands for: ? Asymmetry. This means the mole has an irregular shape. It is not round or oval. ? Border. This means the mole has an irregular or bumpy border. ? Color. This means the mole has multiple colors in it, including brown, black, blue, red, or tan. Note that it is normal for moles to get darker when a woman is  pregnant or takes birth control pills. ? Diameter. This means the mole is more than 0.2 inches (6 mm) across. ? Evolving. This refers to any unusual changes or symptoms in the mole, such as pain, itching, stinging, sensitivity, or bleeding.  If you have a large number of moles, see a skin doctor (dermatologist) at least one time every year for a full-body skin check.   Lifestyle  When you are outdoors, wear sunscreen with SPF 30 (sun protection factor 30) or higher.  Use an adequate amount of sunscreen to cover exposed areas of skin. Put it on 30 minutes before you go out. Reapply it every 2 hours or anytime you come out of the water.  When you are out in the sun, wear a broad-brimmed hat and clothing that covers your arms and legs. Wear wraparound sunglasses.   Contact a health care provider if:  The size, shape, borders, or color of your mole changes.  Your mole, or the skin near the mole, becomes painful, sore, red, or swollen.  Your mole: ? Develops more than one color. ? Itches or bleeds. ? Becomes scaly, sheds skin, or oozes fluid. ? Becomes flat or develops raised areas. ? Becomes hard or soft.  You develop a new mole. Summary  A mole is a colored (pigmented) growth on the skin. Moles are very common. They are usually harmless, but some  moles can become cancerous over time.  Every month, look for new moles and check your existing moles for changes. This is important because a change in a mole can mean that the mole has become cancerous.  If you have a large number of moles, see a skin doctor (dermatologist) at least one time every year for a full-body skin check.  When you are outdoors, wear sunscreen with SPF 30 (sun protection factor 30) or higher. Reapply it every 2 hours or anytime you come out of the water.  Contact a health care provider if you notice changes in a mole or if you develop a new mole. This information is not intended to replace advice given to you by  your health care provider. Make sure you discuss any questions you have with your health care provider. Document Revised: 04/04/2019 Document Reviewed: 01/23/2018 Elsevier Patient Education  [place].

## 2021-01-12 NOTE — Progress Notes (Signed)
Patient ID: Xavier Ramsey, male    DOB: 1991-05-05  MRN: [id number]  CC: Mole Growth  Subjective: Xavier Ramsey is a 30 y.o. male who presents for mole growth. He is accompanied by his Mother.  His concerns today include:   1. MOLE GROWTH: Duration: at least 1 year Location: left upper abdomen Painful: yes  Itching: yes  Onset: gradual Context: bigger Associated signs and symptoms:  History of skin cancer: no  History of precancerous skin lesions: no Family history of skin cancer: no   Comments: Does squeeze and will not pop.   2. ARM BURN: Reports yesterday hit his arm while taking food out of oven. Immediately began to blister. Put aloe vera on it which helped some. Wrapped in gauze and ace bandage.  Patient Active Problem List   Diagnosis Date Noted  . 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 citalopram (CELEXA) 20 MG tablet Take half tablet daily for 1 week then take 1 tablet daily 30 tablet 1   No current facility-administered medications on file prior to visit.    No Known Allergies  Social History   Socioeconomic History  . Marital status: Single    Spouse name: Not on file  . Number of children: Not on file  . Years of education: Not on file  . Highest education level: Not on file  Occupational History  . Not on file  Tobacco Use  . Smoking status: Never Smoker  . Smokeless tobacco: Never Used  Vaping Use  . Vaping Use: Never used  Substance and Sexual Activity  . Alcohol use: Yes    Comment: weekends  . Drug use: Yes    Types: Marijuana  . Sexual activity: Not on file  Other Topics Concern  . Not on file  Social History Narrative  . Not on file   Social Determinants of Health   Financial Resource Strain: Not on file  Food Insecurity: Not on file  Transportation Needs: Not on file  Physical Activity: Not on file  Stress: Not on file   Social Connections: Not on file  Intimate Partner Violence: Not on file    Family History  Problem Relation Age of Onset  . Hypertension Mother     History reviewed. No pertinent surgical history.  ROS: Review of Systems Negative except as stated above  PHYSICAL EXAM: BP 133/90 (BP Location: Left Arm, Patient Position: Sitting)   Pulse 67   Wt 160 lb (72.6 kg)   SpO2 98%   BMI 24.69 kg/m   Physical Exam HENT:     Head: Normocephalic and atraumatic.  Eyes:     Extraocular Movements: Extraocular movements intact.     Conjunctiva/sclera: Conjunctivae normal.     Pupils: Pupils are equal, round, and reactive to light.  Cardiovascular:     Rate and Rhythm: Normal rate and regular rhythm.     Pulses: Normal pulses.     Heart sounds: Normal heart sounds.  Pulmonary:     Effort: Pulmonary effort is normal.     Breath sounds: Normal breath sounds.  Musculoskeletal:        General: Normal range of motion.     Cervical back: Normal range of motion and neck supple.  Skin:    Findings: Lesion present.     Comments: Superficial burn with fluid filled blister right forearm. Concern for abnormal  mole growth left upper abdomen.   Neurological:     General: No focal deficit present.     Mental Status: He is alert and oriented to person, place, and time.  Psychiatric:        Mood and Affect: Mood normal.        Behavior: Behavior normal.       ASSESSMENT AND PLAN: 1. Atypical mole: - Left upper abdomen present for at least 1 year and growing since then. - Referral to Dermatology for further evaluation and management.  - Ambulatory referral to Dermatology  2. Superficial burn of right forearm, initial encounter: - Bacitracin ointment as prescribed.  - Follow-up with primary provider as scheduled.  - bacitracin 500 UNIT/GM ointment; Apply 1 application topically 2 (two) times daily.  Dispense: 15 g; Refill: 0    Patient was given the opportunity to ask questions.   Patient verbalized understanding of the plan and was able to repeat key elements of the plan. Patient was given clear instructions to go to Emergency Department or return to medical center if symptoms don't improve, worsen, or new problems develop.The patient verbalized understanding.   Orders Placed This Encounter  Procedures  . Ambulatory referral to Dermatology     Requested Prescriptions   Signed Prescriptions Disp Refills  . bacitracin 500 UNIT/GM ointment 15 g 0    Sig: Apply 1 application topically 2 (two) times daily.    Follow-up with primary provider as scheduled.   Camillia Herter, NP

## 2021-01-13 NOTE — Telephone Encounter (Signed)
I return Pt mom call, she was explain again the the [facility] program does not paid for her son medical bill is the [facility] discount those, at this time we still waiting for the approval

## 2021-01-25 ENCOUNTER — Telehealth: Payer: Self-pay | Admitting: Internal Medicine

## 2021-01-25 ENCOUNTER — Ambulatory Visit: Payer: Self-pay | Admitting: Internal Medicine

## 2021-01-25 NOTE — Telephone Encounter (Signed)
Pt's mom Barkley Boards called asking to speak with a nurse or a provider to ask some questions about the neurology referral for the pt.

## 2021-01-25 NOTE — Telephone Encounter (Signed)
Called pt mother answered/ PCE-Pt signed DPR authorizing all [facility] to verbally disclose any medical information to [facility] (parent). Clarify with mother the Neurology referal. Stated she will call and make the appt ASAP

## 2021-01-26 ENCOUNTER — Telehealth: Payer: Self-pay | Admitting: Internal Medicine

## 2021-01-26 NOTE — Telephone Encounter (Signed)
Patient mother called in requesting referral for dentist. Patient mother stated patient just received OC and is needing a routine cleaning and check-up. States it has been a long time since patient has been to dentist. Please advise.

## 2021-01-28 ENCOUNTER — Other Ambulatory Visit: Payer: Self-pay | Admitting: Internal Medicine

## 2021-01-28 DIAGNOSIS — Z9189 Other specified personal risk factors, not elsewhere classified: Secondary | ICD-10-CM

## 2021-01-28 NOTE — Telephone Encounter (Signed)
Dental referral placed.   Phill Myron, D.O. Primary Care at [facility]  01/28/2021, 11:16 AM

## 2021-02-23 ENCOUNTER — Ambulatory Visit: Payer: Medicaid Other | Admitting: Family

## 2021-03-09 NOTE — Progress Notes (Signed)
Patient did not show for appointment.   

## 2021-03-10 ENCOUNTER — Encounter: Payer: Medicaid Other | Admitting: Family

## 2021-03-23 ENCOUNTER — Encounter: Payer: Medicaid Other | Admitting: Psychology

## 2021-04-13 ENCOUNTER — Encounter: Payer: Medicaid Other | Admitting: Psychology

## 2021-05-20 ENCOUNTER — Telehealth: Payer: Self-pay | Admitting: Internal Medicine

## 2021-05-20 NOTE — Telephone Encounter (Signed)
I return Pt mother call, LVM to call back

## 2021-05-20 NOTE — Telephone Encounter (Signed)
Copied from [place] [id number]. Topic: General - Call Back - No Documentation >> May 20, 2021 12:32 PM Erick Blinks wrote: Barnett Abu for CRM: Pt's mother Barkley Boards is requesting to speak to [place] regarding questions about his financial aid assistance.   Best contact: [phone]

## 2021-05-21 ENCOUNTER — Telehealth: Payer: Self-pay | Admitting: Internal Medicine

## 2021-05-21 NOTE — Telephone Encounter (Signed)
Pt mother call, froward the info to [facility] to handle the billing question

## 2021-05-21 NOTE — Telephone Encounter (Signed)
Request was route to [facility]

## 2021-05-21 NOTE — Telephone Encounter (Signed)
Pts mother called in to return [place] call, but he was unavailable at the moment and she asked if he could give her a call back. Please advise.

## 2021-06-03 NOTE — Progress Notes (Signed)
Patient ID: Xavier Ramsey, male    DOB: 1991-04-25  MRN: [id number]  CC: Abdominal Pain   Subjective: Xavier Ramsey is a 30 y.o. male who presents for abdominal pain.   His concerns today include:  Reports ongoing for several months. Endorses nausea, vomiting, decreased appetite, and fatigue. Reports smoking marijuana to stimulate appetite. Unable to identify triggers causing abdominal pain and nausea.   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   No current facility-administered medications on file prior to visit.    No Known Allergies  Social History   Socioeconomic History   Marital status: Single    Spouse name: Not on file   Number of children: Not on file   Years of education: Not on file   Highest education level: Not on file  Occupational History   Not on file  Tobacco Use   Smoking status: Never   Smokeless tobacco: Never  Vaping Use   Vaping Use: Never used  Substance and Sexual Activity   Alcohol use: Yes    Alcohol/week: 2.0 standard drinks    Types: 2 Shots of liquor per week    Comment: weekends   Drug use: Yes    Types: Marijuana   Sexual activity: Not on file  Other Topics Concern   Not on file  Social History Narrative   Not on file   Social Determinants of Health   Financial Resource Strain: Not on file  Food Insecurity: Not on file  Transportation Needs: Not on file  Physical Activity: Not on file  Stress: Not on file  Social Connections: Not on file  Intimate Partner Violence: Not on file    Family History  Problem Relation Age of Onset   Hypertension Mother     No past surgical history on file.  ROS: Review of Systems Negative except as stated above  PHYSICAL EXAM: BP (!) 134/96 (BP Location: Left Arm, Patient Position: Sitting, Cuff Size: Normal)   Pulse 74   Temp 98.1 F (36.7 C)   Resp 16    Ht 5' 9.02" (1.753 m)   Wt 156 lb 3.2 oz (70.9 kg)   SpO2 97%   BMI 23.06 kg/m    Physical Exam HENT:     Head: Normocephalic and atraumatic.  Eyes:     Extraocular Movements: Extraocular movements intact.     Conjunctiva/sclera: Conjunctivae normal.     Pupils: Pupils are equal, round, and reactive to light.  Cardiovascular:     Rate and Rhythm: Normal rate and regular rhythm.     Pulses: Normal pulses.     Heart sounds: Normal heart sounds.  Pulmonary:     Effort: Pulmonary effort is normal.     Breath sounds: Normal breath sounds.  Abdominal:     General: Bowel sounds are normal.     Palpations: Abdomen is soft.  Musculoskeletal:     Cervical back: Normal range of motion and neck supple.  Neurological:     General: No focal deficit present.     Mental Status: He is alert and oriented to person, place, and time.  Psychiatric:        Mood and Affect: Mood normal.        Behavior: Behavior normal.   ASSESSMENT AND PLAN: 1. Generalized abdominal pain: 2. Loss of appetite: 3. Fatigue, unspecified type:  4. Nausea and vomiting, intractability of vomiting not specified, unspecified vomiting type: - Ultrasound abdomen for further evaluation.  - H Pylori screening.  - CBC to screen for anemia. - CMP14+EGFR to check kidney function, liver function, and electrolyte balance.  - Begin Ondansetron for nausea.  - Counseled that cannabis may be contributing to nausea and vomiting.  - Referral to Gastroenterology for further evaluation and management.  - Follow-up with primary provider as scheduled.  - Ambulatory referral to Gastroenterology - H Pylori, IGM, IGG, IGA AB - CBC - CMP14+EGFR - US Abdomen Complete; Future - ondansetron (ZOFRAN) 4 MG tablet; Take 1 tablet (4 mg total) by mouth every 8 (eight) hours as needed for nausea or vomiting.  Dispense: 20 tablet; Refill: 1    Patient was given the opportunity to ask questions.  Patient verbalized understanding of the plan  and was able to repeat key elements of the plan. Patient was given clear instructions to go to Emergency Department or return to medical center if symptoms don't improve, worsen, or new problems develop.The patient verbalized understanding.   Orders Placed This Encounter  Procedures   US Abdomen Complete   H Pylori, IGM, IGG, IGA AB   CBC   CMP14+EGFR   Ambulatory referral to Gastroenterology   POCT URINALYSIS DIP (CLINITEK)     Requested Prescriptions   Signed Prescriptions Disp Refills   ondansetron (ZOFRAN) 4 MG tablet 20 tablet 1    Sig: Take 1 tablet (4 mg total) by mouth every 8 (eight) hours as needed for nausea or vomiting.    Follow-up with primary provider as scheduled.  Camillia Herter, NP

## 2021-06-04 ENCOUNTER — Encounter: Payer: Self-pay | Admitting: Family

## 2021-06-04 ENCOUNTER — Other Ambulatory Visit: Payer: Self-pay

## 2021-06-04 ENCOUNTER — Ambulatory Visit (INDEPENDENT_AMBULATORY_CARE_PROVIDER_SITE_OTHER): Payer: Self-pay | Admitting: Family

## 2021-06-04 VITALS — BP 134/96 | HR 74 | Temp 98.1°F | Resp 16 | Ht 69.02 in | Wt 156.2 lb

## 2021-06-04 DIAGNOSIS — R630 Anorexia: Secondary | ICD-10-CM

## 2021-06-04 DIAGNOSIS — R1084 Generalized abdominal pain: Secondary | ICD-10-CM

## 2021-06-04 DIAGNOSIS — R112 Nausea with vomiting, unspecified: Secondary | ICD-10-CM

## 2021-06-04 DIAGNOSIS — R5383 Other fatigue: Secondary | ICD-10-CM

## 2021-06-04 MED ORDER — ONDANSETRON HCL 4 MG PO TABS: 4.0000 mg | ORAL_TABLET | Freq: Three times a day (TID) | ORAL | 1 refills | Status: DC | PRN

## 2021-06-04 NOTE — Progress Notes (Signed)
Pt presents for stomach issues  pt reports loss of appetite, fatigue, nausea, vomiting being going on for few months

## 2021-06-07 LAB — CBC
Hematocrit: 39 % (ref 37.5–51.0)
Hemoglobin: 13.7 g/dL (ref 13.0–17.7)
MCH: 34.4 pg — ABNORMAL HIGH (ref 26.6–33.0)
MCHC: 35.1 g/dL (ref 31.5–35.7)
MCV: 98 fL — ABNORMAL HIGH (ref 79–97)
Platelets: 246 10*3/uL (ref 150–450)
RBC: 3.98 x10E6/uL — ABNORMAL LOW (ref 4.14–5.80)
RDW: 13.2 % (ref 11.6–15.4)
WBC: 5 10*3/uL (ref 3.4–10.8)

## 2021-06-07 LAB — CMP14+EGFR
ALT: 45 IU/L — ABNORMAL HIGH (ref 0–44)
AST: 61 IU/L — ABNORMAL HIGH (ref 0–40)
Albumin/Globulin Ratio: 2.1 (ref 1.2–2.2)
Albumin: 4.1 g/dL (ref 4.0–5.0)
Alkaline Phosphatase: 81 IU/L (ref 44–121)
BUN/Creatinine Ratio: 11 (ref 9–20)
BUN: 12 mg/dL (ref 6–20)
Bilirubin Total: 0.6 mg/dL (ref 0.0–1.2)
CO2: 21 mmol/L (ref 20–29)
Calcium: 9 mg/dL (ref 8.7–10.2)
Chloride: 106 mmol/L (ref 96–106)
Creatinine, Ser: 1.1 mg/dL (ref 0.76–1.27)
Globulin, Total: 2 g/dL (ref 1.5–4.5)
Glucose: 85 mg/dL (ref 65–99)
Potassium: 4.1 mmol/L (ref 3.5–5.2)
Sodium: 144 mmol/L (ref 134–144)
Total Protein: 6.1 g/dL (ref 6.0–8.5)
eGFR: 92 mL/min/{1.73_m2} (ref >59)

## 2021-06-07 LAB — H PYLORI, IGM, IGG, IGA AB
H pylori, IgM Abs: <9 units (ref 0.0–8.9)
H. pylori, IgA Abs: <9 units (ref 0.0–8.9)
H. pylori, IgG AbS: 0.26 Index Value (ref 0.00–0.79)

## 2021-06-08 ENCOUNTER — Other Ambulatory Visit: Payer: Self-pay | Admitting: Family

## 2021-06-08 DIAGNOSIS — Z130 Encounter for screening for diseases of the blood and blood-forming organs and certain disorders involving the immune mechanism: Secondary | ICD-10-CM

## 2021-06-08 NOTE — Progress Notes (Signed)
H. Pylori negative.   Adding iron panel to further screen for anemia.   Kidney function normal.   AST and ALT both elevated. These are enzymes used to check liver function. Encouraged to recheck at lab only appointment in 4 to 6 weeks.

## 2021-06-18 ENCOUNTER — Telehealth: Payer: Self-pay | Admitting: Family

## 2021-06-18 NOTE — Telephone Encounter (Signed)
Patients mother Meredith Mody is calling to get a call about the lab results from his 06/04/2021 office visit. She is also stating when the patient was here he did not let the provider know about his insomnia and anxiety. She is asking for a call to discuss that as well and wanting to know if something can be prescribed. Mom is asking for a call back [phone]. Mom states patient is having physical and mental issues lately.

## 2021-06-23 ENCOUNTER — Other Ambulatory Visit: Payer: Self-pay | Admitting: Family

## 2021-06-23 ENCOUNTER — Telehealth: Payer: Self-pay | Admitting: *Deleted

## 2021-06-23 DIAGNOSIS — R748 Abnormal levels of other serum enzymes: Secondary | ICD-10-CM

## 2021-06-23 NOTE — Telephone Encounter (Signed)
Hepatic function panel ordered.   Please offer patient appointment with me in regards to sleep medication.

## 2021-06-23 NOTE — Telephone Encounter (Signed)
Patient mother (on [place]) is calling to inquire about: 1- lab reults from last OV in September.  Informed of results and also informed the results were received via MyChart.   2. Sleep medication for patient.    Noted in result note patient will need additional labs for iron and LFT's. Please place order for LFT's

## 2021-06-29 NOTE — Telephone Encounter (Signed)
Patient mother aware that PCP prefers to discuss as an OV. Aware of pending labs.  Given number to [facility] billing. Patient is w/o insurance.

## 2021-06-30 ENCOUNTER — Ambulatory Visit: Payer: Medicaid Other | Admitting: Family Medicine

## 2021-07-02 NOTE — Progress Notes (Signed)
Virtual Visit via Telephone Note  I connected with Xavier Ramsey, on 07/06/2021 at 3:25 PM by telephone due to the COVID-19 pandemic and verified that I am speaking with the correct person using two identifiers.  Due to current restrictions/limitations of in-office visits due to the COVID-19 pandemic, this scheduled clinical appointment was converted to a telehealth visit.   Consent: I discussed the limitations, risks, security and privacy concerns of performing an evaluation and management service by telephone and the availability of in person appointments. I also discussed with the patient that there may be a patient responsible charge related to this service. The patient expressed understanding and agreed to proceed.   Location of Patient: Home  Location of Provider: [facility] [place] participating in Telemedicine visit: LANDAN FEDIE Durene Fruits, NP [place], [place]  History of Present Illness: Xavier Ramsey is a 30 y.o. male who presents for anxiety insomnia.   Concerns and/or issues: Reports anxiety began in 2009 after a concussion. In the past took Xanax and helped. Reports sleep pattern off and in the past took Ambien which helped. Requesting refills.   Depression screen [facility] 2/9 07/06/2021 01/12/2021 10/21/2020  Decreased Interest 0 1 0  Down, Depressed, Hopeless 2 1 0  PHQ - 2 Score 2 2 0  Altered sleeping 3 1 0  Tired, decreased energy 3 1 0  Change in appetite 3 1 0  Feeling bad or failure about yourself  0 1 0  Trouble concentrating 3 1 0  Moving slowly or fidgety/restless 0 1 0  Suicidal thoughts 0 1 0  PHQ-9 Score 14 9 0  Difficult doing work/chores - Somewhat difficult Not difficult at all  Some encounter information is confidential and restricted. Go to Review Flowsheets activity to see all data.     Past Medical History:  Diagnosis Date   Anxiety    [employer]rees[employer] 10/21/2020   Substance abuse ([place])    [employer]reesia  10/21/2020   No Known Allergies  Current Outpatient Medications on File Prior to Visit  Medication Sig Dispense Refill   ondansetron (ZOFRAN) 4 MG tablet Take 1 tablet (4 mg total) by mouth every 8 (eight) hours as needed for nausea or vomiting. (Patient not taking: Reported on 07/06/2021) 20 tablet 1   No current facility-administered medications on file prior to visit.    Observations/Objective: Alert and oriented x 3. Not in acute distress. Physical examination not completed as this is a telemedicine visit.  Assessment and Plan: 1. Anxiety and depression: - Patient denies thoughts of self-harm, suicidal ideations, homicidal ideations. - Counseled patient I do not prescribe Alprazolam and Zolpidem. Patient verbalized understanding.  - Referral to Psychiatry for further evaluation and management.  - Follow-up with primary provider as scheduled.  - Ambulatory referral to Psychiatry   Follow Up Instructions: Follow-up with primary provider as scheduled. Referral to Psychiatry.   Patient was given clear instructions to go to Emergency Department or return to medical center if symptoms don't improve, worsen, or new problems develop.The patient verbalized understanding.  I discussed the assessment and treatment plan with the patient. The patient was provided an opportunity to ask questions and all were answered. The patient agreed with the plan and demonstrated an understanding of the instructions.   The patient was advised to call back or seek an in-person evaluation if the symptoms worsen or if the condition fails to improve as anticipated.    I provided 5 minutes total of  non-face-to-face time during this encounter.   Camillia Herter, NP  [facility] at [place], [place] 07/06/2021, 3:25 PM

## 2021-07-06 ENCOUNTER — Telehealth (INDEPENDENT_AMBULATORY_CARE_PROVIDER_SITE_OTHER): Payer: Self-pay | Admitting: Family

## 2021-07-06 ENCOUNTER — Other Ambulatory Visit: Payer: Self-pay

## 2021-07-06 DIAGNOSIS — F419 Anxiety disorder, unspecified: Secondary | ICD-10-CM

## 2021-07-06 DIAGNOSIS — F32A Depression, unspecified: Secondary | ICD-10-CM

## 2021-07-06 NOTE — Progress Notes (Signed)
Pt is requesting Xanax and Ambien. Made pt aware that we don't prescribe Xnax but he can talk to Amy in regards to the Ambien

## 2021-09-14 ENCOUNTER — Ambulatory Visit (HOSPITAL_COMMUNITY): Payer: No Payment, Other | Admitting: Psychiatry

## 2022-04-06 ENCOUNTER — Encounter (HOSPITAL_COMMUNITY): Payer: Self-pay

## 2022-04-06 ENCOUNTER — Emergency Department (HOSPITAL_COMMUNITY): Payer: Self-pay

## 2022-04-06 ENCOUNTER — Other Ambulatory Visit: Payer: Self-pay

## 2022-04-06 ENCOUNTER — Emergency Department (HOSPITAL_COMMUNITY)
Admission: EM | Admit: 2022-04-06 | Discharge: 2022-04-06 | Disposition: A | Discharge status: Home / Self Care | Payer: Self-pay | Attending: Emergency Medicine | Admitting: Emergency Medicine

## 2022-04-06 DIAGNOSIS — R739 Hyperglycemia, unspecified: Secondary | ICD-10-CM | POA: Insufficient documentation

## 2022-04-06 DIAGNOSIS — E876 Hypokalemia: Secondary | ICD-10-CM | POA: Insufficient documentation

## 2022-04-06 DIAGNOSIS — E8721 Acute metabolic acidosis: Secondary | ICD-10-CM | POA: Insufficient documentation

## 2022-04-06 DIAGNOSIS — R519 Headache, unspecified: Secondary | ICD-10-CM | POA: Insufficient documentation

## 2022-04-06 DIAGNOSIS — R112 Nausea with vomiting, unspecified: Secondary | ICD-10-CM | POA: Insufficient documentation

## 2022-04-06 DIAGNOSIS — R55 Syncope and collapse: Secondary | ICD-10-CM | POA: Insufficient documentation

## 2022-04-06 DIAGNOSIS — R42 Dizziness and giddiness: Secondary | ICD-10-CM | POA: Insufficient documentation

## 2022-04-06 LAB — CBC WITH DIFFERENTIAL/PLATELET
Abs Immature Granulocytes: 0.01 10*3/uL (ref 0.00–0.07)
Basophils Absolute: 0 10*3/uL (ref 0.0–0.1)
Basophils Relative: 0 %
Eosinophils Absolute: 0 10*3/uL (ref 0.0–0.5)
Eosinophils Relative: 0 %
HCT: 38.7 % — ABNORMAL LOW (ref 39.0–52.0)
Hemoglobin: 14.3 g/dL (ref 13.0–17.0)
Immature Granulocytes: 0 %
Lymphocytes Relative: 28 %
Lymphs Abs: 1 10*3/uL (ref 0.7–4.0)
MCH: 36.7 pg — ABNORMAL HIGH (ref 26.0–34.0)
MCHC: 37 g/dL — ABNORMAL HIGH (ref 30.0–36.0)
MCV: 99.2 fL (ref 80.0–100.0)
Monocytes Absolute: 0.4 10*3/uL (ref 0.1–1.0)
Monocytes Relative: 10 %
Neutro Abs: 2.1 10*3/uL (ref 1.7–7.7)
Neutrophils Relative %: 62 %
Platelets: 212 10*3/uL (ref 150–400)
RBC: 3.9 MIL/uL — ABNORMAL LOW (ref 4.22–5.81)
RDW: 14 % (ref 11.5–15.5)
WBC: 3.5 10*3/uL — ABNORMAL LOW (ref 4.0–10.5)
nRBC: 0 % (ref 0.0–0.2)

## 2022-04-06 LAB — BASIC METABOLIC PANEL
Anion gap: 16 — ABNORMAL HIGH (ref 5–15)
BUN: 8 mg/dL (ref 6–20)
CO2: 26 mmol/L (ref 22–32)
Calcium: 9.2 mg/dL (ref 8.9–10.3)
Chloride: 100 mmol/L (ref 98–111)
Creatinine, Ser: 0.88 mg/dL (ref 0.61–1.24)
GFR, Estimated: >60 mL/min (ref >60)
Glucose, Bld: 101 mg/dL — ABNORMAL HIGH (ref 70–99)
Potassium: 3.4 mmol/L — ABNORMAL LOW (ref 3.5–5.1)
Sodium: 142 mmol/L (ref 135–145)

## 2022-04-06 MED ORDER — METHOCARBAMOL 500 MG PO TABS: 500.0000 mg | ORAL_TABLET | Freq: Two times a day (BID) | ORAL | 0 refills | Status: DC

## 2022-04-06 MED ORDER — ONDANSETRON HCL 4 MG PO TABS: 4.0000 mg | ORAL_TABLET | Freq: Three times a day (TID) | ORAL | 0 refills | Status: DC | PRN

## 2022-04-06 MED ORDER — SODIUM CHLORIDE 0.9 % IV BOLUS
1000.0000 mL | Freq: Once | INTRAVENOUS | Status: AC
  Administered 2022-04-06: 1000 mL via INTRAVENOUS

## 2022-04-06 MED ORDER — LIDOCAINE 5 % EX PTCH: 1.0000 | MEDICATED_PATCH | CUTANEOUS | 0 refills | Status: DC

## 2022-04-06 MED ORDER — ONDANSETRON HCL 4 MG/2ML IJ SOLN
4.0000 mg | Freq: Once | INTRAMUSCULAR | Status: AC
  Administered 2022-04-06: 4 mg via INTRAVENOUS
  Filled 2022-04-06: qty 2

## 2022-04-06 MED ORDER — LIDOCAINE 5 % EX PTCH
1.0000 | MEDICATED_PATCH | Freq: Once | CUTANEOUS | Status: DC
  Administered 2022-04-06: 1 via TRANSDERMAL
  Filled 2022-04-06: qty 1

## 2022-04-06 NOTE — Discharge Instructions (Addendum)
It was a pleasure of taking care of you today!  Your labs and imaging were unremarkable. You will be sent a prescription for lidocaine patches, robaxin, zofran. Do not drive or operate heavy machinery while taking this medication (Robaxin).  Return to the emergency department if you are experiencing increasing/worsening headache, vision changes, nausea, vomiting, worsening symptoms.

## 2022-04-06 NOTE — ED Provider Triage Note (Signed)
Emergency Medicine Provider Triage Evaluation Note  Xavier Ramsey , a 31 y.o. male  was evaluated in triage.  Pt complains of headache, dizziness, lack of appetite which at this point are chronic symptoms.  However patient presents today because he had a syncopal episode while sitting on the commode.  He felt the room spinning around him prior to passing out.  Denies chest pain, shortness of breath.  Review of Systems  Positive: As above Negative: As above  Physical Exam  BP (!) 135/102 (BP Location: Right Arm)   Pulse 79   Temp 98.2 F (36.8 C) (Oral)   Resp 16   Ht '5\' 7"'$  (1.702 m)   Wt 68 kg   SpO2 100%   BMI 23.49 kg/m  Gen:   Awake, no distress   Resp:  Normal effort  MSK:   Moves extremities without difficulty  Other:    Medical Decision Making  Medically screening exam initiated at 2:01 PM.  Appropriate orders placed.  Xavier Ramsey was informed that the remainder of the evaluation will be completed by another provider, this initial triage assessment does not replace that evaluation, and the importance of remaining in the ED until their evaluation is complete.    Evlyn Courier, PA-C 04/06/22 1402

## 2022-04-06 NOTE — ED Notes (Signed)
Patient transported to CT 

## 2022-04-06 NOTE — ED Triage Notes (Signed)
Pt c/o headache, nausea, lightheadedness, and vomiting for over a hear. States symptoms are worsening. Reports he briefly passed out today while using the restroom

## 2022-04-06 NOTE — ED Provider Notes (Signed)
[facility] DEPT Provider Note   CSN: [id number] Arrival date & time: 04/06/22  1325     History  Chief Complaint  Patient presents with   Headache   Nausea   Emesis   Loss of Consciousness    Xavier Ramsey is a 31 y.o. male who presents to the emergency department with concerns for headache onset 2-3 days.  He notes that his headache, dizziness are chronic in nature.  He notes that he has had dizziness since middle school due to concussions.  Has been evaluated multiple times in the past for his symptoms with negative work-ups.  Notes that his symptoms have been acutely worsening over the past couple months.  He notes that his dizziness is worse with position change.  Has resolved nausea, vomiting.  Denies having a neurologist at this time.  Denies chest pain, shortness of breath, back pain, gait problem.   The history is provided by the patient. No language interpreter was used.       Home Medications Prior to Admission medications   Medication Sig Start Date End Date Taking? Authorizing Provider  lidocaine (LIDODERM) 5 % Place 1 patch onto the skin daily. Remove & Discard patch within 12 hours or as directed by MD 04/06/22  Yes Indio Santilli A, PA-C  methocarbamol (ROBAXIN) 500 MG tablet Take 1 tablet (500 mg total) by mouth 2 (two) times daily. 04/06/22  Yes Adean Milosevic A, PA-C  ondansetron (ZOFRAN) 4 MG tablet Take 1 tablet (4 mg total) by mouth every 8 (eight) hours as needed for nausea or vomiting. 04/06/22  Yes Payal Stanforth A, PA-C      Allergies    Patient has no known allergies.    Review of Systems   Review of Systems  Respiratory:  Negative for shortness of breath.   Cardiovascular:  Positive for syncope. Negative for chest pain.  Gastrointestinal:  Positive for nausea and vomiting.  Musculoskeletal:  Positive for neck pain. Negative for back pain and gait problem.  Neurological:  Positive for headaches.  All other systems  reviewed and are negative.   Physical Exam Updated Vital Signs BP (!) 140/91   Pulse 81   Temp 98.2 F (36.8 C) (Oral)   Resp (!) 26   Ht '5\' 7"'$  (1.702 m)   Wt 68 kg   SpO2 98%   BMI 23.49 kg/m  Physical Exam Vitals and nursing note reviewed.  Constitutional:      General: He is not in acute distress.    Appearance: He is not diaphoretic.  HENT:     Head: Normocephalic and atraumatic.     Mouth/Throat:     Pharynx: No oropharyngeal exudate.  Eyes:     General: No scleral icterus.    Conjunctiva/sclera: Conjunctivae normal.  Cardiovascular:     Rate and Rhythm: Normal rate and regular rhythm.     Pulses: Normal pulses.     Heart sounds: Normal heart sounds.  Pulmonary:     Effort: Pulmonary effort is normal. No respiratory distress.     Breath sounds: Normal breath sounds. No wheezing.  Abdominal:     General: Bowel sounds are normal.     Palpations: Abdomen is soft. There is no mass.     Tenderness: There is no abdominal tenderness. There is no guarding or rebound.  Musculoskeletal:        General: Normal range of motion.     Cervical back: Normal range of motion and neck supple.  Comments: No spinal tenderness to palpation.  Tenderness to palpation noted to left trapezius without overlying skin changes.  Skin:    General: Skin is warm and dry.  Neurological:     General: No focal deficit present.     Mental Status: He is alert.     Cranial Nerves: Cranial nerves 2-12 are intact.     Sensory: Sensation is intact.     Motor: Motor function is intact.     Comments: Negative pronator drift.  No focal neurological deficits on exam.  Psychiatric:        Behavior: Behavior normal.     ED Results / Procedures / Treatments   Labs (all labs ordered are listed, but only abnormal results are displayed) Labs Reviewed  CBC WITH DIFFERENTIAL/PLATELET - Abnormal; Notable for the following components:      Result Value   WBC 3.5 (*)    RBC 3.90 (*)    HCT 38.7 (*)     MCH 36.7 (*)    MCHC 37.0 (*)    All other components within normal limits  BASIC METABOLIC PANEL - Abnormal; Notable for the following components:   Potassium 3.4 (*)    Glucose, Bld 101 (*)    Anion gap 16 (*)    All other components within normal limits    EKG None  Radiology CT Cervical Spine Wo Contrast  Result Date: 04/06/2022 CLINICAL DATA:  Headache, dizziness and loss of appetite. EXAM: CT CERVICAL SPINE WITHOUT CONTRAST TECHNIQUE: Multidetector CT imaging of the cervical spine was performed without intravenous contrast. Multiplanar CT image reconstructions were also generated. RADIATION DOSE REDUCTION: This exam was performed according to the departmental dose-optimization program which includes automated exposure control, adjustment of the mA and/or kV according to patient size and/or use of iterative reconstruction technique. COMPARISON:  None Available. FINDINGS: Alignment: There is straightening of the normal cervical spine lordosis. Skull base and vertebrae: No acute fracture. No primary bone lesion or focal pathologic process. Soft tissues and spinal canal: No prevertebral fluid or swelling. No visible canal hematoma. Disc levels: Normal multilevel endplates are seen with normal multilevel intervertebral disc spaces. Normal, bilateral multilevel facet joints are noted. Upper chest: Negative. Other: None. IMPRESSION: 1. No acute fracture or subluxation in the cervical spine. 2. Straightening of the normal cervical spine lordosis, which may be due to positioning or muscle spasm. Electronically Signed   By: Virgina Norfolk M.D.   On: 04/06/2022 19:08   CT Head Wo Contrast  Result Date: 04/06/2022 CLINICAL DATA:  Dizziness and headache. EXAM: CT HEAD WITHOUT CONTRAST TECHNIQUE: Contiguous axial images were obtained from the base of the skull through the vertex without intravenous contrast. RADIATION DOSE REDUCTION: This exam was performed according to the departmental  dose-optimization program which includes automated exposure control, adjustment of the mA and/or kV according to patient size and/or use of iterative reconstruction technique. COMPARISON:  Jan 10, 2005 FINDINGS: Brain: No evidence of acute infarction, hemorrhage, hydrocephalus, extra-axial collection or mass lesion/mass effect. Vascular: No hyperdense vessel or unexpected calcification. Skull: Normal. Negative for fracture or focal lesion. Sinuses/Orbits: No acute finding. Other: None. IMPRESSION: No acute intracranial abnormality. Electronically Signed   By: Virgina Norfolk M.D.   On: 04/06/2022 19:06    Procedures Procedures    Medications Ordered in ED Medications  lidocaine (LIDODERM) 5 % 1 patch (1 patch Transdermal Patch Applied 04/06/22 2040)  sodium chloride 0.9 % bolus 1,000 mL (0 mLs Intravenous Stopped 04/06/22 2050)  ondansetron (ZOFRAN)  injection 4 mg (4 mg Intravenous Given 04/06/22 1844)    ED Course/ Medical Decision Making/ A&P Clinical Course as of 04/06/22 2143  Wed Apr 06, 2022  1956 Re-evaluated and patient resting comfortably on stretcher.  Discussed with patient and mother imaging findings.  Pt noted that he had improvement of his symptoms with treatment regimen in the ED. discussed discharge treatment plan with patient and mother at bedside.  Answered all available questions.  Patient appears safe for discharge at this time. [SB]    Clinical Course User Index [SB] Rhiann Boucher A, PA-C                           Medical Decision Making Amount and/or Complexity of Data Reviewed Radiology: ordered.  Risk Prescription drug management.   Pt presents with concerns for headache onset 2-3 days.  Has associated dizziness that is chronic in nature along with his headache.  Has had dizziness since middle school due to concussions.  Has been evaluated multiple times with negative work-ups.  Patient afebrile.  On exam patient without spinal tenderness to palpation.  Tenderness  to palpation noted to left trapezius without overlying skin changes.  No focal neurological deficits on exam.  Negative pronator drift.  Differential diagnosis includes intracranial abnormality, migraine, arrhythmia, anemia, electrolyte abnormality.   Labs:  I ordered, and personally interpreted labs.  The pertinent results include:   BMP with decreased potassium at 3.4, anion gap slightly elevated at 16, slight elevated glucose at 101 otherwise unremarkable. CBC overall unremarkable  Imaging: I ordered imaging studies including CT head, CT cervical spine I independently visualized and interpreted imaging which showed: No acute intracranial abnormality.  No acute fracture or herniation noted on CT spine.  Lordosis noted likely due to muscle spasm I agree with the radiologist interpretation  Medications:  I ordered medication including IVF, lidoderm patch, zofran for symptom managment Reevaluation of the patient after these medicines and interventions, I reevaluated the patient and found that they have improved I have reviewed the patients home medicines and have made adjustments as needed  Disposition: Presentation suspicious for syncope and collapse as well as nausea and vomiting.  Doubt intracranial normality, arrhythmia, anemia, electrolyte abnormality at this time.  Slight anion gap likely in the setting of starvation ketoacidosis. After consideration of the diagnostic results and the patients response to treatment, I feel that the patient would benefit from Discharge home.  We will send patient home with a prescription for Robaxin, Lidoderm, Zofran.  Discussed with patient importance of not driving or operating heavy machinery while taking the Robaxin.  Discussed with patient importance of follow-up with primary care provider.  Also provided patient with information for concussion clinic.  Supportive care measures and strict return precautions discussed with patient at bedside. Pt  acknowledges and verbalizes understanding. Pt appears safe for discharge. Follow up as indicated in discharge paperwork.    This chart was dictated using voice recognition software, Dragon. Despite the best efforts of this provider to proofread and correct errors, errors may still occur which can change documentation meaning.   Final Clinical Impression(s) / ED Diagnoses Final diagnoses:  Syncope and collapse  Nausea and vomiting, unspecified vomiting type    Rx / DC Orders ED Discharge Orders          Ordered    lidocaine (LIDODERM) 5 %  Every 24 hours        04/06/22 2027    methocarbamol (  ROBAXIN) 500 MG tablet  2 times daily        04/06/22 2027    ondansetron (ZOFRAN) 4 MG tablet  Every 8 hours PRN        04/06/22 2027

## 2022-04-14 ENCOUNTER — Encounter: Payer: Self-pay | Admitting: Neurology

## 2022-05-19 ENCOUNTER — Ambulatory Visit (HOSPITAL_COMMUNITY)
Admission: EM | Admit: 2022-05-19 | Discharge: 2022-05-19 | Disposition: A | Discharge status: Home / Self Care | Payer: No Payment, Other | Attending: Psychiatry | Admitting: Psychiatry

## 2022-05-19 DIAGNOSIS — F0630 Mood disorder due to known physiological condition, unspecified: Secondary | ICD-10-CM | POA: Insufficient documentation

## 2022-05-19 DIAGNOSIS — S0990XS Unspecified injury of head, sequela: Secondary | ICD-10-CM

## 2022-05-19 NOTE — Progress Notes (Signed)
Patient is discharging at this time. Printed AVS reviewed with and given to patient by provider. Patient denies SI/HI/AVH with no plan/intent. No s/s of current distress.

## 2022-05-19 NOTE — Discharge Instructions (Addendum)
Outpatient Resources: Medication/Therapy    If you are not given an appointment, please call and ask about your insurance prior to making an appointment in the event the agency is not contracted with your insurance or require a copay.      Mobile Crisis Number: [phone]   24/7 response       [facility]   [address] [place]   [place], [place] [address]   [phone]      [facility]   [address] [place], [place] [address]   [phone]   (Required copay upfront to hold appointment)      [facility]:  (will also take occasional Medicaid)   [facility]: [address], [place], [place] [address] [phone]    [facility]: [address], [place], [place] [address] [phone]   [place]: [place], [place], [place] [address] [phone]   [facility]: [place], [place], [place] [address] [place]   [place], [place] [address]   [facility], [place]   [address], [place], [place] [address]   [facility]   [address] [place]   [place], [place] [address]   [facility] ([place] and [place])   [place]:  [address] [place] [address]  [phone]   [place]:  [facility] [address], [place], [place] [address]  [phone]      [facility]   [address] [place] [address] [place], [place] [address]   [place]   [place], [place] [address]   [phone]      [facility]   [place]   [place], [place] [address]   [phone]      Launa Flight (Psychiatrist)   [place], [place] [address]   [phone]      [facility]   [place], [place] [address]   [phone]   (walk in appointments for new patients 9am-4pm)      [place]     [place], [place] [address]   [phone]      [facility] and [facility]   [address], [place]   [place], [place] [address]   [place]   [address]   [place], [place] [address]   [place]   [address] [place], [place]   [place], [place] [address]   [phone]      [facility] ([place])   [address]  [place]   [place], [place] [address]   [facility] (also accepts [facility] and [facility])   [address]   [place], [place] [address]   [phone]   (walk-in clinic hours: Mon/Tues 12pm-4pm, Thurs 8am-12pm)         [facility] (therapy only, no medication management onsite) (accepts [facility])   [address]  [place], [place] [address]   [phone]

## 2022-05-19 NOTE — Progress Notes (Signed)
Patient arrives to the [facility] with his mother and his son seeking a psychiatric evaluation. Patient states that he is applying for disability because of numerous concussions that he has experienced that prevent him from being able to maintain emplyment due to his brain injuries. Patient states that he has issues with balance. He states that he has a neurologist appointment next week.  He states that he was hospitalized at [facility] in the past for depression, but currently is not receiving treatment for his depression. He denies current SI/HI/Psychosis.  Patient states that he has descrased sleep and states that he only sleeps 4 hours per night and he states that he has a decreased appetite and states that he has lost 20 pounds. Patient admits to use of 1-2 blunts of marijuana 1-2 times weekly and states that he drinks a pint of liquor on the weekends.  Patient is routine.

## 2022-05-19 NOTE — ED Provider Notes (Signed)
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 male.  Patient presents voluntarily to [facility] behavioral health follow-up and assessment.  He is assessed, face-to-face, by nurse practitioner.  He is seated in assessment area, no apparent distress.  He presents with euthymic mood, congruent affect.  Patient states "my mom set up this appointment because I am trying to file for disability and she said it would be good to give to my lawyer."  Patient reports recent stressors include a concussion in 2007 that has resulted in him developing vertigo and he fell in his bathroom approximately 1 month ago.  Reports he has also had memory loss.  He feels outpatient individual counseling could be beneficial as he navigates physical health concerns.  Patient is not currently linked with outpatient psychiatry.  He reports he would like to have therapy "someone to talk to."  He does have an appointment upcoming with an outpatient counselor next week.  Patient was briefly treated by counseling in high school related to anger issues after his father moved to another state.  Also briefly seen by outpatient counseling in college when he experienced a concussion therefore could no longer play football.  He also endorses history of 1 previous inpatient psychiatric hospitalization at age 29 after he attempted to hang himself.  No family mental health history reported.  Patient denies suicidal and homicidal ideations today.  He easily contracts for safety verbally with this Probation officer.  He denies history of nonsuicidal self-harm behavior.  He endorses history of 1 previous suicide attempt.  He denies auditory and visual hallucinations.  There is no evidence of delusional thought content and  no indication that patient is responding to internal stimuli.  Xavier Ramsey resides in [place] with his mother and 56-year-old son.  He denies access to weapons.  He is not currently employed.  He endorses average sleep and appetite.  He endorses alcohol use, approximately an average of 4 drinks 3 times per week.  He denies alcohol-related seizure history. Last alcohol use on yesterday. He also endorses marijuana use, uses marijuana approximately 2 times per week on average. Most recent marijuana use one week ago.  Patient offered support and encouragement.  He gives verbal consent to speak with his mother, Xavier Ramsey phone number [phone]. Spoke with patient's mother who denies safety concerns.  She confirms to her knowledge patient does not have access to weapons.  Patient's mother actually intended for patient to be seen in the outpatient area at [facility] behavioral health today.  Patient's mother verbalizes understanding of strict return precautions.   Patient and family are educated and verbalize understanding of mental health resources and other crisis services in the community. They are instructed to call 911 and present to the nearest emergency room should patient experience any suicidal/homicidal ideation, auditory/visual/hallucinations, or detrimental worsening of mental health condition.     Psychiatric Specialty Exam  Presentation  General Appearance:Appropriate for Environment; Casual  Eye Contact:Good  Speech:Clear and Coherent; Normal Rate  Speech Volume:Normal  Handedness:Right   Mood and Affect  Mood:Euthymic  Affect:Appropriate; Congruent   Thought Process  Thought Processes:Coherent; Goal Directed; Linear  Descriptions of Associations:Intact  Orientation:Full (Time, Place and Person)  Thought Content:Logical; WDL    Hallucinations:None  Ideas of Reference:None  Suicidal Thoughts:No  Homicidal Thoughts:No   Sensorium  Memory:Immediate  Good; Recent Good  Judgment:Good  Insight:Fair   Executive Functions  Concentration:Good  Attention Span:Good  [place] of Knowledge:Good  Language:Good   Psychomotor Activity  Psychomotor Activity:Normal   Assets  Assets:Communication Skills; Desire for Improvement; Housing; Intimacy; Leisure Time; Physical Health; Resilience; Social Support   Sleep  Sleep:No data recorded Number of hours: No data recorded  Nutritional Assessment (For OBS and FBC admissions only) Has the patient had a weight loss or gain of 10 pounds or more in the last 3 months?: No Has the patient had a decrease in food intake/or appetite?: No Does the patient have dental problems?: No Does the patient have eating habits or behaviors that may be indicators of an eating disorder including binging or inducing vomiting?: No Has the patient recently lost weight without trying?: 0 Has the patient been eating poorly because of a decreased appetite?: 0 Malnutrition Screening Tool Score: 0    Physical Exam: Physical Exam Vitals and nursing note reviewed.  Constitutional:      Appearance: Normal appearance. He is well-developed and normal weight.  HENT:     Head: Normocephalic.  Cardiovascular:     Rate and Rhythm: Normal rate.  Pulmonary:     Effort: Pulmonary effort is normal.  Neurological:     Mental Status: He is alert and oriented to person, place, and time.  Psychiatric:        Attention and Perception: Attention and perception normal.        Mood and Affect: Mood and affect normal.        Speech: Speech normal.        Behavior: Behavior normal. Behavior is cooperative.        Thought Content: Thought content normal.        Cognition and Memory: Cognition and memory normal.    Review of Systems  Constitutional: Negative.   HENT: Negative.    Eyes: Negative.   Respiratory: Negative.    Cardiovascular: Negative.   Gastrointestinal: Negative.   Genitourinary: Negative.    Musculoskeletal: Negative.   Skin: Negative.   Neurological: Negative.   Psychiatric/Behavioral: Negative.     Blood pressure 129/84, pulse 95, temperature 98.3 F (36.8 C), temperature source Oral, resp. rate 20, SpO2 99 %. There is no height or weight on file to calculate BMI.  Musculoskeletal: Strength & Muscle Tone: within normal limits Gait & Station: normal Patient leans: N/A   [place] Discharge Disposition for Follow up and Recommendations: Based on my evaluation the patient does not appear to have an emergency medical condition and can be discharged with resources and follow up care in outpatient services for Medication Management and Individual Therapy Patient reviewed with Dr. Hampton Abbot. Follow-up with outpatient psychiatry, resources provided.   Lucky Rathke, FNP 05/19/2022, 6:52 PM

## 2022-06-17 NOTE — Progress Notes (Signed)
Initial neurology clinic note  Xavier Ramsey MRN: [id number] DOB: 18-Mar-1991  Referring provider: Camillia Herter, NP  Primary care provider: Pcp, No  Reason for consult:  Headache  Subjective:  This is Mr. Xavier Ramsey, a 31 y.o. right-handed male with a medical history of multiple concussions, anxiety who presents to neurology clinic with headaches and memory loss. The patient is accompanied by mother.  Patient first had a head injury in 2006 playing basketball. Per Mom, he had concussions prior to this as well, all sports related. His most recent concussion was 2009. Patient thinks he has had 10-20 total concussions. At least 4-5 of the concussions were associated with blacking out. The last concussion in 2009, the patient was carted off the football field and not able to play sports any longer. Prior to his first concussion, patient had rare headaches.   Soon after the first concussion, patient started noticing dizziness, which he describes as room spinning. Patient also had headaches that he describes as throbbing and stabbing in the frontal area. He rates the pain 8/10. He would have 2-3 per week that would last 30 minutes and come and go. He also describes nausea. He endorses photophobia and phonophobia during headaches. He lays down when he has a headache. He rarely takes anything for headaches (over the counter). He has never been prescribed medication for headaches. He takes zofran once a day for nausea, which he has daily. The symptoms are usually all together, but can be separate. Patient's mom also noticed memory issues, that has increasing worsened, particularly over the last year. He even forgets things that are written down.   He also noticed anxiety that started after his concussions. He would be panicked, poor sleep, feeling worried. Regarding sleep, he usually sleeps at 12-1 am. He won't sleep long, maybe waking at 4-5 am. He will stay up for 1-2 hours, then go back to  sleep for another 2 hours. He does not feel rested when he wakes up. He snores when he sleep. He endorses vivid dreams and waking up gasping for air. He has never seen sleep medicine or had a sleep study. He is prescribed Remeron for mood.  Patient presented to the ED on 04/06/22 for headache for 2-3 days. Patient has passed out and fallen in the bathroom due to vertigo. Patient was using the bathroom. He bent over to get toilet paper and passed out. He hit the ground and woke up immediately. His girlfriend came into the room and patient called his mother. He then went to the ED. He had a CT head and cervical spine that were unremarkable.  Patient endorses having MRI before, but it was at least 10 years ago.  Of note, patient is participating in free PT through [facility] that recently started. They are working some with his neck.  Caffeine:  2-3 sodas per week Alcohol:  2-3 drinks per week Drug use: smokes marj 2-3 times per week; helps with symptoms (nausea and anxiety) Smoker: occasional vaping Diet:  eats 2 meals per day, but not heavy meals (poor appetite) Exercise: not much Other pain: Has neck spasms and tightness (puts lidocaine patches on his neck). Patient has noticed some numbness and tingling in legs, especially when waking up, only since starting his medications (Remeron, zofran, robaxin) Family history of headache:  Mother used to have migraines  MEDICATIONS:  Outpatient Encounter Medications as of 06/22/2022  Medication Sig   lidocaine (LIDODERM) 5 % Place 1 patch  onto the skin daily. Remove & Discard patch within 12 hours or as directed by MD   methocarbamol (ROBAXIN) 500 MG tablet Take 1 tablet (500 mg total) by mouth 2 (two) times daily.   ondansetron (ZOFRAN) 4 MG tablet Take 1 tablet (4 mg total) by mouth every 8 (eight) hours as needed for nausea or vomiting.   mirtazapine (REMERON) 15 MG tablet Take 15 mg by mouth at bedtime.   omeprazole (PRILOSEC) 20 MG  capsule Take 20 mg by mouth daily.   ondansetron (ZOFRAN) 8 MG tablet Take 8 mg by mouth daily.   No facility-administered encounter medications on file as of 06/22/2022.    PAST MEDICAL HISTORY: Past Medical History:  Diagnosis Date   Anxiety    [employer] 10/21/2020   Substance abuse ([place])    [employer] 10/21/2020    PAST SURGICAL HISTORY: No past surgical history on file.  ALLERGIES: No Known Allergies  FAMILY HISTORY: Family History  Problem Relation Age of Onset   Hypertension Mother     SOCIAL HISTORY: Social History   Tobacco Use   Smoking status: Never   Smokeless tobacco: Never  Vaping Use   Vaping Use: Never used  Substance Use Topics   Alcohol use: Yes    Alcohol/week: 2.0 standard drinks of alcohol    Types: 2 Shots of liquor per week    Comment: weekends   Drug use: Not Currently    Types: Marijuana   Social History   Social History Narrative   Not on file    Objective:  Vital Signs:  BP 124/87   Pulse (!) 108   Ht '5\' 7"'$  (1.702 m)   Wt 143 lb 12.8 oz (65.2 kg)   SpO2 100%   BMI 22.52 kg/m   General: No acute distress.  Patient appears well-groomed.   Head:  Normocephalic/atraumatic Eyes:  fundi examined, normal cup to disc appearance. Crisp margins. Neck: supple, reduced range of motion, paraspinal tightness Back: No paraspinal tenderness  Neurological Exam: Mental status: alert and oriented, speech fluent and not dysarthric, language intact.  Cranial nerves: CN I: not tested CN II: pupils equal, round and reactive to light, visual fields intact CN III, IV, VI:  full range of motion, horizontal end gaze nystagmus (left > right), no ptosis CN V: facial sensation intact. CN VII: upper and lower face symmetric CN VIII: hearing intact CN IX, X: gag intact, uvula midline CN XI: sternocleidomastoid and trapezius muscles intact CN XII: tongue midline  Bulk & Tone: normal, no fasciculations. Motor:  muscle strength 5/5 throughout Deep  Tendon Reflexes:  2+ throughout   Sensation:  Patchy diminished sensation to pinprick on medial aspect of left lower leg near ankle, but not including foot. Otherwise intact. Finger to nose testing:  Without dysmetria.   Gait:  Normal station and stride.  Romberg negative.   Labs and Imaging review: Internal labs: Normal or unremarkable: BMP, CBC, HIV, TSH  Imaging: CT head wo contrast (04/06/22): FINDINGS: Brain: No evidence of acute infarction, hemorrhage, hydrocephalus, extra-axial collection or mass lesion/mass effect.   Vascular: No hyperdense vessel or unexpected calcification.   Skull: Normal. Negative for fracture or focal lesion.   Sinuses/Orbits: No acute finding.   Other: None.   IMPRESSION: No acute intracranial abnormality.  CT cervical spine wo contrast (04/06/22): FINDINGS: Alignment: There is straightening of the normal cervical spine lordosis.   Skull base and vertebrae: No acute fracture. No primary bone lesion or focal pathologic process.   Soft  tissues and spinal canal: No prevertebral fluid or swelling. No visible canal hematoma.   Disc levels: Normal multilevel endplates are seen with normal multilevel intervertebral disc spaces.   Normal, bilateral multilevel facet joints are noted.   Upper chest: Negative.   Other: None.   IMPRESSION: 1. No acute fracture or subluxation in the cervical spine. 2. Straightening of the normal cervical spine lordosis, which may be due to positioning or muscle spasm.   Assessment/Plan:  TYESON TANIMOTO is a 31 y.o. male who presents for evaluation of headaches, nausea, dizziness, and memory changes. He has a relevant medical history of multiple concussions, anxiety. His neurological examination is essentially normal today. Available diagnostic data is significant for CT head and cervical spine being unremarkable.   The etiology of patient's symptoms are likely multifactorial. There appears to be a connection  to prior concussions, so postconcussive syndrome is at least contributing. He also endorses significant neck pain and tightness, so cervicalgia could be contributing to both headaches and dizziness. Poor sleep and mood changes are likely contributors to memory changes and headaches as well. His nutrition has also been poor, so a vitamin deficiency is also possible.  PLAN: -Blood work: B12, B1, Mg, vit D -MRI brain w/wo contrast -Continue PT for neck pain, tightness -Sleep medicine referral for insomnia, ?sleep apnea -Discussed potential medications, including switching Remeron to Nortriptyline, but patient and mother would prefer to defer for now   -Return to clinic in 4 months  The impression above as well as the plan as outlined below were extensively discussed with the patient (in the company of mother) who voiced understanding. All questions were answered to their satisfaction.  When available, results of the above investigations and possible further recommendations will be communicated to the patient via telephone/MyChart. Patient to call office if not contacted after expected testing turnaround time.   Total time spent reviewing records, interview, history/exam, documentation, and coordination of care on day of encounter:  70 min   Thank you for allowing me to participate in patient's care.  If I can answer any additional questions, I would be pleased to do so.  Kai Levins, MD   CC: Pcp, No No address on file  CC: Referring provider: Camillia Herter, NP [address] [place],  [place] [address]

## 2022-06-22 ENCOUNTER — Encounter: Payer: Self-pay | Admitting: Neurology

## 2022-06-22 ENCOUNTER — Other Ambulatory Visit (INDEPENDENT_AMBULATORY_CARE_PROVIDER_SITE_OTHER): Payer: Self-pay

## 2022-06-22 ENCOUNTER — Ambulatory Visit (INDEPENDENT_AMBULATORY_CARE_PROVIDER_SITE_OTHER): Payer: Self-pay | Admitting: Neurology

## 2022-06-22 VITALS — BP 124/87 | HR 108 | Ht 67.0 in | Wt 143.8 lb

## 2022-06-22 DIAGNOSIS — F0781 Postconcussional syndrome: Secondary | ICD-10-CM

## 2022-06-22 DIAGNOSIS — R413 Other amnesia: Secondary | ICD-10-CM

## 2022-06-22 DIAGNOSIS — G4700 Insomnia, unspecified: Secondary | ICD-10-CM

## 2022-06-22 DIAGNOSIS — R5383 Other fatigue: Secondary | ICD-10-CM

## 2022-06-22 DIAGNOSIS — G8929 Other chronic pain: Secondary | ICD-10-CM

## 2022-06-22 DIAGNOSIS — R519 Headache, unspecified: Secondary | ICD-10-CM

## 2022-06-22 DIAGNOSIS — R42 Dizziness and giddiness: Secondary | ICD-10-CM

## 2022-06-22 DIAGNOSIS — M542 Cervicalgia: Secondary | ICD-10-CM

## 2022-06-22 LAB — VITAMIN B12: Vitamin B-12: 553 pg/mL (ref 211–911)

## 2022-06-22 LAB — MAGNESIUM: Magnesium: 1.9 mg/dL (ref 1.5–2.5)

## 2022-06-22 NOTE — Addendum Note (Signed)
Addended by: Renae Gloss on: 06/22/2022 09:54 AM   Modules accepted: Orders

## 2022-06-22 NOTE — Addendum Note (Signed)
Addended by: Renae Gloss on: 06/22/2022 12:06 PM   Modules accepted: Orders

## 2022-06-22 NOTE — Patient Instructions (Signed)
I saw you today for headaches, nausea, dizziness, and memory problems.  I think your symptoms are the result of prior concussions in combination of neck tightness, poor sleep, and mood changes.  I would like to get labs today to check vitamins since your nutrition has been poor. Vitamin deficiencies can contribute to your symptoms.  I would like to get an MRI of your brain to make sure there are no changes/injuries that would be concerning. This can also serve as a baseline for the future.  I would like you to continue the physical therapy you are doing and make sure they are focusing on your neck. If not, let me know and I can place a referral for you.  I would like you to see sleep medicine to discuss your problems with sleep.  We discussed medications, but you decided to hold off on this for now.  I would like to see you back in clinic in 4 months. I will be in touch about your results when I have them. Please let me know if you have any questions or concerns in the meantime.  The physicians and staff at [facility] are committed to providing excellent care. You may receive a survey requesting feedback about your experience at our office. We strive to receive "very good" responses to the survey questions. If you feel that your experience would prevent you from giving the office a "very good " response, please contact our office to try to remedy the situation. We may be reached at [phone]. Thank you for taking the time out of your busy day to complete the survey.  Kai Levins, MD [facility]

## 2022-06-26 LAB — VITAMIN B1: Vitamin B1 (Thiamine): <6 nmol/L — ABNORMAL LOW (ref 8–30)

## 2022-06-27 ENCOUNTER — Telehealth: Payer: Self-pay | Admitting: Neurology

## 2022-06-27 ENCOUNTER — Encounter: Payer: Self-pay | Admitting: Neurology

## 2022-06-27 NOTE — Telephone Encounter (Signed)
Left message to call for lab results or check MyChart messages. I sent the following via MyChart:  Mr. Pew,   I attempted to call today, but obviously did not reach you. I left a message to call or check this MyChart message. Your labs have returned and your B1 (or thiamine) level was very low. This can happen when nutrition has not been great, as you mentioned. This can cause a lot of different symptoms including neurologic symptoms such as memory problems, dizziness, numbness/tingling and more. You definitely need to take thiamine supplement every day.   I recommend thiamine (B1) 100 mg tablets. For the first week, I want you to take 2 tablets (200 mg) every day. After the first week, you can take 1 tablet. Continue to do this everyday for a while. We will recheck down the road to make sure it has come up.   Please let me know if you have any questions or concerns.   Best,   Kai Levins, MD [facility]

## 2022-07-25 ENCOUNTER — Telehealth: Payer: Self-pay | Admitting: Neurology

## 2022-07-25 NOTE — Telephone Encounter (Signed)
Pt's mother called in stating a couple referrals were sent in for the pt when he was last here that were sent to [facility]. She says he now has financial assistance through [facility] and would like to see if Referrals can be sent to [facility] facilities?

## 2022-07-25 NOTE — Telephone Encounter (Signed)
Called and [facility] will call and make an appointment with him.

## 2022-07-25 NOTE — Progress Notes (Signed)
Xavier Ramsey Outpatient Progress Note  07/26/2022 6:11 PM ELADIO DENTREMONT  MRN:  [id number]  Assessment:  Xavier Ramsey presents for follow-up evaluation. Today, 07/26/22, patient reports history of symptoms of depression and anxiety that began after experiencing numerous concussions from 2006-2009; denies psychiatric history prior to this. It is likely that current mood symptoms are influenced by both neurologic sequelae from head trauma as well as psychological response to change in lifestyle and challenge to identity imposed by postconcussive symptoms.  He often notes ruminating on his past and experiencing feelings of sadness and irritability related to current incapacities. Denies SI at this time, and no acute safety concern on interview.  He was started on Remeron a few months ago with some benefit for mood and anxiety although continues to have poor sleep and appetite; he is amenable to further titration although discussed that while higher doses may be more helpful for mood/anxiety there may be decreasing benefit for sleep and appetite although this response is variable and we will monitor carefully.  Patient also endorses irritability and low frustration tolerance that is likely a component of postconcussive syndrome; plan to trial propranolol as below.  Patient is also scheduled to start individual psychotherapy.  Plan to return to care in 4 weeks.  Identifying Information: Xavier Ramsey is a 31 y.o. 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   Plan:  # MDD  Anxiety Past medication trials: Celexa (only took briefly - "out of it") Status of problem: new problem to this provider Interventions: -- INCREASE Remeron to 30 mg nightly (i11/14/23) -- Patient to  establish with individual psychotherapy  # Irritability  Postconcussive syndrome Past medication trials: n/a Status of problem: New problem to this provider Interventions: -- START propranolol 10 mg twice daily as needed irritability/agitation; if effective we will consider scheduling  -- Risks, benefits, and side effects including but not limited to dizziness, decreased blood pressure, decreased heart rate reviewed with informed consent provided.  # Sleep dysregulation Past medication trials: melatonin Status of problem: New problem to this provider Interventions: -- Agree with pursuing sleep study -- START melatonin 3 mg qEvening -- Education provided on appropriate sleep hygiene and importance of eliminating daytime naps  Patient was given contact information for behavioral health clinic and was instructed to call 911 for emergencies.   Subjective:  Chief Complaint:  Chief Complaint  Patient presents with   Medication Management    Interval History:   Chart review:  Patient was seen for initial psychiatric evaluation by Dr. Toy Care on 11/12/20. At that time, diagnosis felt to be consistent with mood disorder related to past head injury. Started on Celexa 20 mg daily at that time.   Referred back to psychiatry by PCP due to anxiety, mood symptoms, and insomnia.   Seen in [facility] 05/19/22: patient requesting evaluation to provide disability lawyer; denied any acute psychiatric concerns  Neurology visit 06/22/22: evaluation for headaches, nausea, dizziness, and memory changes. Concern for postconcussive syndrome. Labs, MRI brain ordered; sleep medicine referral placed. Found to have low B1 and started on thiamine supplementation.  Today, patient reports trouble with memory, poor appetite/nausea/weight loss, anxiety, and depression. States he requires frequent reminders from mom. Endorses vertigo and now in physical therapy. Saw a neurologist who plans to obtain MRI.  Reports he saw  a  therapist in high school and college; saw a psychiatrist a long time ago and was on a medication briefly. Recalls Celexa but feels he only took it for limited time - didn't like the way it made him feel ("sleepy, out of it").   Currently taking Remeron prescribed by internal medicine NP. Has been taking it 2-3 months; finds it helpful for mood, anxiety, sleep is a bit better. Appetite remains low - eats only one meal a day; snacks rarely. Endorses weight loss of 10-15 lbs over the last few months.   Mood currently is "low" and doesn't feel like himself. Endorses negative thoughts, anhedonia (doesn't enjoy traveling, being around family), low energy and motivation, trouble concentrating, low appetite. Can't remember the last time he felt like himself.   Denies SI - last experienced in 2009 when he was told he wouldn't be able to play football. Denies HI. Denies AVH. Denies hypomania/manic symptoms. Endorses becoming easily frustrated since head injuries - may be set off by not remembering things, wishing things were different. May raise his voice and get emotional - usually has to distance himself from others. Denies physical aggression. Sleeping 3-4 hours nightly; does nap during the day.   Endorses frequent anxiety over his health and feelings of overwhelm. Endorses periods of increased anxiety characterized by feeling sweating, heart racing. Used to smoke weed but denies in the past year; reports he stopped due to worsening anxiety and nausea.   Patient provided consent for mom to join the visit: She feels energy has been worse since starting Remeron but also notes sleep remains a significant issue.  Hasn't been able to work related to memory issues and sequelae from head injuries. Has disability hearing approaching.  Discussed with both patient and mother options for treatment.  Patient is amenable to therapy and has appointment scheduled.  He identifies some benefit from Remeron thus far and is open  to further titration; discussed that increased doses may be less effective for sleep and appetite although this is variable and will monitor carefully.  Discussed initiation of propranolol to target irritability and agitation.  All questions/concerns addressed.  Visit Diagnosis:    ICD-10-CM   1. Episode of recurrent major depressive disorder, unspecified depression episode severity ([place])  F33.9     2. Anxiety  F41.9     3. Postconcussive syndrome  F07.81       Past Psychiatric History:  Diagnoses: MDD, postconcussive syndrome Medication trials: Celexa (only took briefly - "out of it") Previous psychiatrist/therapist: briefly saw a therapist in high school related to anger issues after father moved to another state and again in college after experiencing a concussion and impacting ability to play football Hospitalizations:  x1 at 31 yo after suicide attempt Suicide attempts: aborted SA x1 at 31 yo via attempted hanging Current access to guns: denies Hx of abuse: denies Head trauma: endorses history of several concussions while playing contact sports (basketball/football) from 2006-2009; estimates 10-20 concussions in total Substance use:   -- Tobacco: denies  -- Etoh: a few times per month; 2 drinks each sitting  -- Cannabis: reports last used > 1 year ago  -- Denies illicit drug use  Past Medical History:  Past Medical History:  Diagnosis Date   Anxiety    [employer]rees[employer] 10/21/2020   Depression    Substance abuse ([place])    [employer]reesia 10/21/2020   History reviewed. No pertinent surgical history.  Family Psychiatric History: none reported  Family History:  Family History  Problem Relation Age of Onset   Hypertension Mother     Social History:  Social History   Socioeconomic History   Marital status: Single    Spouse name: Not on file   Number of children: Not on file   Years of education: Not on file   Highest education level: Not on file  Occupational History   Not on  file  Tobacco Use   Smoking status: Every Day    Packs/day: 0.50    Types: Cigarettes   Smokeless tobacco: Never  Vaping Use   Vaping Use: Never used  Substance and Sexual Activity   Alcohol use: Yes    Alcohol/week: 2.0 standard drinks of alcohol    Types: 2 Shots of liquor per week    Comment: weekends   Drug use: Not Currently    Types: Marijuana   Sexual activity: Not on file  Other Topics Concern   Not on file  Social History Narrative   Right handed    Unemployed   Social Determinants of Health   Financial Resource Strain: Not on file  Food Insecurity: Not on file  Transportation Needs: Not on file  Physical Activity: Not on file  Stress: Not on file  Social Connections: Not on file    Allergies: No Known Allergies  Current Medications: Current Outpatient Medications  Medication Sig Dispense Refill   cyanocobalamin (VITAMIN B12) 500 MCG tablet Take 500 mcg by mouth daily.     lidocaine (LIDODERM) 5 % Place 1 patch onto the skin daily. Remove & Discard patch within 12 hours or as directed by Ramsey 30 patch 0   melatonin 3 MG TABS tablet Take 1 tablet (3 mg total) by mouth at bedtime.     methocarbamol (ROBAXIN) 500 MG tablet Take 1 tablet (500 mg total) by mouth 2 (two) times daily. 20 tablet 0   omeprazole (PRILOSEC) 20 MG capsule Take 20 mg by mouth daily.     ondansetron (ZOFRAN) 4 MG tablet Take 1 tablet (4 mg total) by mouth every 8 (eight) hours as needed for nausea or vomiting. 10 tablet 0   propranolol (INDERAL) 10 MG tablet Take 1 tablet (10 mg total) by mouth 2 (two) times daily as needed (agitation/irritability). 60 tablet 1   thiamine (VITAMIN B-1) 100 MG tablet Take 100 mg by mouth daily.     mirtazapine (REMERON) 30 MG tablet Take 1 tablet (30 mg total) by mouth at bedtime. 30 tablet 2   ondansetron (ZOFRAN) 8 MG tablet Take 8 mg by mouth daily.     No current facility-administered medications for this visit.    ROS: Endorses vertigo, low appetite,  nausea  Objective:  Psychiatric Specialty Exam: There were no vitals taken for this visit.There is no height or weight on file to calculate BMI.  General Appearance: Casual and Well Groomed  Eye Contact:  Good  Speech:  Clear and Coherent and Normal Rate  Volume:  Normal  Mood:   "depressed - not like myself"  Affect:   Depressed, constricted  Thought Content:  Denies AVH; IOR    Suicidal Thoughts:  No  Homicidal Thoughts:  No  Thought Process:  Goal Directed and Linear  Orientation:  Full (Time, Place, and Person)    Memory:   Reports impairment in short and long term memory  Judgment:  Good  Insight:  Good  Concentration:  Concentration: Fair  Recall:   Plan to more formally test at next visit  Fund of Knowledge:  Fair  Language: Good  Psychomotor Activity:  Normal  Akathisia:  Yes  AIMS (if indicated): not done  Assets:  Communication Skills Desire for Improvement Housing Leisure Time Social Support Talents/Skills  ADL's:  Intact  Cognition: WNL  Sleep:  Poor   PE: General: sits comfortably in view of camera; no acute distress  Pulm: no increased work of breathing on room air  MSK: all extremity movements appear intact  Neuro: no focal neurological deficits observed  Gait & Station: unable to assess by video    Metabolic Disorder Labs: No results found for: "HGBA1C", "MPG" No results found for: "PROLACTIN" Lab Results  Component Value Date   CHOL 182 10/21/2020   TRIG 158 (H) 10/21/2020   HDL 67 10/21/2020   CHOLHDL 2.7 10/21/2020   LDLCALC 88 10/21/2020    Therapeutic Level Labs: No results found for: "LITHIUM" No results found for: "VALPROATE" No results found for: "CBMZ"  Screenings:  GAD-7    Flowsheet Row Video Visit from 07/06/2021 in Primary Care at [facility] Visit from 10/21/2020 in Primary Care at [facility]  Total GAD-7 Score 16 0      PHQ2-9    Flowsheet Row Counselor from 07/26/2022 in [facility] Video Visit from 07/06/2021 in Primary Care at [facility] Visit from 01/12/2021 in [facility] [place] Visit from 11/12/2020 in [facility] Office Visit from 10/21/2020 in Primary Care at [facility]  PHQ-2 Total Score '5 2 2 '$ 0 0  PHQ-9 Total Score '20 14 9 4 '$ 0      Flowsheet Row Counselor from 07/26/2022 in [facility] ED from 04/06/2022 in [place] [facility] DEPT Office Visit from 11/12/2020 in [facility] Risk No Risk No Risk       Collaboration of Care: Collaboration of Care: Medication Management AEB ongoing medication management, Psychiatrist AEB established with this provider, and Referral or follow-up with counselor/therapist AEB established with individual psychotherapy  Patient/Guardian was advised Release of Information must be obtained prior to any record release in order to collaborate their care with an outside provider. Patient/Guardian was advised if they have not already done so to contact the registration department to sign all necessary forms in order for [place] to release information regarding their care.   Consent: Patient/Guardian gives verbal consent for treatment and assignment of benefits for services provided during this visit. Patient/Guardian expressed understanding and agreed to proceed.   Televisit via video: I connected with patient on 07/26/22 at  1:00 PM EST by a video enabled telemedicine application and verified that I am speaking with the correct person using two identifiers.  Location: Patient: home address in [place] Provider: clinic   I discussed the limitations of evaluation and management by telemedicine and the availability of in person appointments. The patient expressed understanding and agreed to proceed.  I discussed the assessment and treatment plan with the patient. The patient was  provided an opportunity to ask questions and all were answered. The patient agreed with the plan and demonstrated an understanding of the instructions.   The patient was advised to call back or seek an in-person evaluation if the symptoms worsen or if the condition fails to improve as anticipated.  I provided 80 minutes of non-face-to-face time during this encounter.  Fani Rotondo A Vinetta Brach 07/26/2022, 6:11 PM

## 2022-07-26 ENCOUNTER — Ambulatory Visit (INDEPENDENT_AMBULATORY_CARE_PROVIDER_SITE_OTHER): Payer: No Payment, Other | Admitting: Psychiatry

## 2022-07-26 ENCOUNTER — Encounter (HOSPITAL_COMMUNITY): Payer: Self-pay | Admitting: Psychiatry

## 2022-07-26 ENCOUNTER — Other Ambulatory Visit: Payer: Self-pay

## 2022-07-26 ENCOUNTER — Ambulatory Visit (INDEPENDENT_AMBULATORY_CARE_PROVIDER_SITE_OTHER): Payer: No Payment, Other | Admitting: Licensed Clinical Social Worker

## 2022-07-26 DIAGNOSIS — F419 Anxiety disorder, unspecified: Secondary | ICD-10-CM

## 2022-07-26 DIAGNOSIS — F0781 Postconcussional syndrome: Secondary | ICD-10-CM

## 2022-07-26 DIAGNOSIS — F332 Major depressive disorder, recurrent severe without psychotic features: Secondary | ICD-10-CM

## 2022-07-26 DIAGNOSIS — F339 Major depressive disorder, recurrent, unspecified: Secondary | ICD-10-CM

## 2022-07-26 MED ORDER — MELATONIN 3 MG PO TABS: 3.0000 mg | ORAL_TABLET | Freq: Every evening | ORAL | Status: DC

## 2022-07-26 MED ORDER — PROPRANOLOL HCL 10 MG PO TABS
10.0000 mg | ORAL_TABLET | Freq: Two times a day (BID) | ORAL | 1 refills | Status: DC | PRN
  Filled 2022-07-26: qty 60, 30d supply, fill #0

## 2022-07-26 MED ORDER — MIRTAZAPINE 30 MG PO TABS
30.0000 mg | ORAL_TABLET | Freq: Every evening | ORAL | 2 refills | Status: DC
  Filled 2022-07-26: qty 30, 30d supply, fill #0

## 2022-07-26 NOTE — Progress Notes (Signed)
Comprehensive Clinical Assessment (CCA) Note  07/26/2022 BROK STOCKING [id number]  Chief Complaint:  Chief Complaint  Patient presents with   Depression   Post-Traumatic Stress Disorder   Visit Diagnosis: Severe episode of recurrent major depressive disorder, without psychotic features ([place])     CCA Screening, Triage and Referral (STR)  What Is the Reason for Your Visit/Call Today? Initial assessment for depressive symptoms and mood swings  How Long Has This Been Causing You Problems? > than 6 months  What Do You Feel Would Help You the Most Today? Treatment for Depression or other mood problem   Have You Recently Been in Any Inpatient Treatment (Hospital/Detox/Crisis Center/28-Day Program)? No  Have You Ever Received Services From [employer] Before? No  Who Do You See at [facility]? No data recorded  Have You Recently Had Any Thoughts About Hurting Yourself? No  Are You Planning to Commit Suicide/Harm Yourself At This time? No   Have you Recently Had Thoughts About [place]? No  Explanation: No data recorded  Have You Used Any Alcohol or Drugs in the Past 24 Hours? No  How Long Ago Did You Use Drugs or Alcohol? No data recorded What Did You Use and How Much? No data recorded  Do You Currently Have a Therapist/Psychiatrist? No  Name of Therapist/Psychiatrist: No data recorded  Have You Been Recently Discharged From Any Office Practice or Programs? No  Explanation of Discharge From Practice/Program: No data recorded    CCA Screening Triage Referral Assessment Type of Contact: Face-to-Face  Is this Initial or Reassessment? No data recorded  Collateral Involvement: Mother, Shannen Vernon  Does Patient Have a [place]? No data recorded Name and Contact of Legal Guardian: No data recorded If Minor and Not Living with Parent(s), Who has Custody? No data recorded Is CPS involved or ever been involved? Never  Is APS involved  or ever been involved? Never   Patient Determined To Be At Risk for Harm To Self or Others Based on Review of Patient Reported Information or Presenting Complaint? No  Method: No Plan  Availability of Means: No access or NA  Intent: Vague intent or NA  Notification Required: No need or identified person  Additional Information for Danger to Others Potential: No data recorded Additional Comments for Danger to Others Potential: No data recorded Are There Guns or Other Weapons in Your Home? No  Types of Guns/Weapons: No data recorded Are These Weapons Safely Secured?                            No  Who Could Verify You Are Able To Have These Secured: No data recorded Do You Have any Outstanding Charges, Pending Court Dates, Parole/Probation? None  Contacted To Inform of Risk of Harm To Self or Others: No data recorded  Location of Assessment: [facility]   Does Patient Present under Involuntary Commitment? No  IVC Papers Initial File Date: No data recorded  [place] of Residence: [place]   Patient Currently Receiving the Following Services: Not Receiving Services   Determination of Need: Routine (7 days)   Options For Referral: Outpatient Therapy; Medication Management     CCA Biopsychosocial Intake/Chief Complaint:  Abas reports that he has past concussions and vertigo from playing football and basketball in past years with last time playing in 2010. He reports that he has had past mental health outpatien therapy in the past about 10  years ago. He denies history of substance use and denies AVH or psychosis, and SI/HI.  Current Symptoms/Problems: He reports dizziness, decreased appetite, difficulty with memory in short and long term.   Patient Reported Schizophrenia/Schizoaffective Diagnosis in Past: No   Strengths: Taking care of son, age 54. Playing sports  Preferences: Does want to start therapy  Abilities: Playing sports   Type of Services  Patient Feels are Needed: No data recorded  Initial Clinical Notes/Concerns: His mother reports that he has been declining for the past year and a half or two years. He reports that he has past trauma with his dad getting sick and dying. She reports that he resides in the home with her and sometimes he is with his grandparents or a friend other times. She reports that he gets frustrated at times and that he has a decreased appetite and does not eat much due to stress and thinking about his health issues. She reports having applied for disability and is on the final appeal. She reports that he is triggered when he feels like someone doesn't understand him or believe what he is saying. She reports that he states that he says that he is tired of feeling like this.   Mental Health Symptoms Depression:   Fatigue; Change in energy/activity; Sleep (too much or little); Weight gain/loss; Increase/decrease in appetite; Irritability   Duration of Depressive symptoms:  Greater than two weeks   Mania:   Change in energy/activity (Mother provided collateral information and reports that he has explosive episodes and memory loss.)   Anxiety:    Difficulty concentrating   Psychosis:   None   Duration of Psychotic symptoms: No data recorded  Trauma:   Emotional numbing; Irritability/anger   Obsessions:   None   Compulsions:   None   Inattention:   None   Hyperactivity/Impulsivity:   None   Oppositional/Defiant Behaviors:   None   Emotional Irregularity:   Mood lability; Chronic feelings of emptiness   Other Mood/Personality Symptoms:  No data recorded   Mental Status Exam Appearance and self-care  Stature:   Average   Weight:   Average weight   Clothing:   Casual   Grooming:   Normal   Cosmetic use:   None   Posture/gait:   Normal   Motor activity:   Not Remarkable   Sensorium  Attention:   Normal   Concentration:   Normal   Orientation:   X5    Recall/memory:   Defective in Short-term; Defective in Remote   Affect and Mood  Affect:   Flat   Mood:   Euthymic   Relating  Eye contact:   Normal   Facial expression:   Responsive   Attitude toward examiner:   Cooperative   Thought and Language  Speech flow:  Normal   Thought content:   Appropriate to Mood and Circumstances   Preoccupation:   None   Hallucinations:   None   Organization:  No data recorded  [employer] of Knowledge:   Good   Intelligence:   Average   Abstraction:   Normal   Judgement:   Normal   Reality Testing:   Realistic   Insight:   Gaps   Decision Making:   Normal   Social Functioning  Social Maturity:   Responsible   Social Judgement:   Normal   Stress  Stressors:   Other (Comment) (Reports that he has not been feeling the same and is getting an  MRI 08/18/2022.)   Coping Ability:   Normal   Skill Deficits:   None   Supports:   Family     Religion: Religion/Spirituality Are You A Religious Person?: No  Leisure/Recreation: Leisure / Recreation Do You Have Hobbies?: Yes Leisure and Hobbies: Playing sports and spending time with son  Exercise/Diet: Exercise/Diet Do You Exercise?: Yes What Type of Exercise Do You Do?: Other (Comment) (Limited physical therapy) How Many Times a Week Do You Exercise?: 1-3 times a week Have You Gained or Lost A Significant Amount of Weight in the Past Six Months?: Yes-Lost Number of Pounds Lost?: -5 Do You Follow a Special Diet?: No Do You Have Any Trouble Sleeping?: Yes Explanation of Sleeping Difficulties: Difficulty falling asleep and staying asleep and reports sleeping about 3-4 hours a night.   CCA Employment/Education Employment/Work Situation: Employment / Work Situation Employment Situation: Unemployed Patient's Job has Been Impacted by Current Illness: Yes Describe how Patient's Job has Been Impacted: Unable to work due to medical concerns  and neck injury What is the Longest Time Patient has Held a Job?: [employer] for 4-5 years Has Patient ever Been in the [employer]?: No  Education: Education Is Patient Currently Attending School?: No Did Teacher, adult education From [employer]?: Yes Did Physicist, medical?: Yes What Type of College Degree Do you Have?: Some college Did You Have An Individualized Education Program (IIEP): No Did You Have Any Difficulty At [employer]?: No Patient's Education Has Been Impacted by Current Illness: No   CCA Family/Childhood History Family and Relationship History: Family history Marital status: Single Does patient have children?: Yes How many children?: 1 How is patient's relationship with their children?: Reports they are close and they have a good relationship.  Childhood History:  Childhood History By whom was/is the patient raised?: Mother, Father Additional childhood history information: Lamoine reports that he was raised by his mother primarily and they always had a good relationship. He reports that his father was on drugs and he was in and out of his life but they still maintained a relationship. Does patient have siblings?: Yes Number of Siblings: 1 Description of patient's current relationship with siblings: Half-brother on father's side and they talk on the phone mostly because he resides in [place]. Did patient suffer any verbal/emotional/physical/sexual abuse as a child?: No Did patient suffer from severe childhood neglect?: No Has patient ever been sexually abused/assaulted/raped as an adolescent or adult?: No Was the patient ever a victim of a crime or a disaster?: No Witnessed domestic violence?: No Has patient been affected by domestic violence as an adult?: No  Child/Adolescent Assessment:     CCA Substance Use Alcohol/Drug Use: Alcohol / Drug Use History of alcohol / drug use?: No history of alcohol / drug abuse Withdrawal Symptoms: None                          ASAM's:  Six Dimensions of Multidimensional Assessment  Dimension 1:  Acute Intoxication and/or Withdrawal Potential:      Dimension 2:  Biomedical Conditions and Complications:      Dimension 3:  Emotional, Behavioral, or Cognitive Conditions and Complications:     Dimension 4:  Readiness to Change:     Dimension 5:  Relapse, Continued use, or Continued Problem Potential:     Dimension 6:  Recovery/Living Environment:     ASAM Severity Score:    ASAM Recommended Level of Treatment:     Substance  use Disorder (SUD)    Recommendations for Services/Supports/Treatments:    DSM5 Diagnoses: Patient Active Problem List   Diagnosis Date Noted   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 Davell reports that he has past concussions and vertigo from playing football and basketball in past years with last time playing in 2010. He reports that he has had past mental health outpatien therapy in the past about 10 years ago. He denies history of substance use and denies AVH or psychosis, and SI/HI. His mother is also present and provides collateral information regarding past history and mood symptoms. His mother reports that he has been declining for the past year and a half or two years. He reports that he has past trauma with his dad getting sick and dying. She reports that he resides in the home with her and sometimes he is with his grandparents or a friend other times. She reports that he gets frustrated at times and that he has a decreased appetite and does not eat much due to stress and thinking about his health issues. She reports having applied for disability and is on the final appeal. She reports that he is triggered when he feels like someone doesn't understand him or believe what he is saying. She  reports that he states that he says that he is tired of feeling like this.   Lenton reports the following symptoms: Fatigue; Change in energy/activity; Sleep (too much or little); Weight gain/loss of at least 10 pounds; Decrease in appetite; Irritability; Change in energy/activity (Mother provided collateral information and reports that he has explosive episodes and memory loss.) and past trauma history regarding the death of his father over 10 years ago. He reports that he has worked in the past part time for 4 hours a day and at the end of the day he would feel drained.   Current Symptoms/Problems: He reports dizziness, decreased appetite, difficulty with memory in short and long term.  Hillary is recommended for individual outpatient therapy and medication management. Diagnosis rule out of Bipolar I disorder should be determined after further assessment of symptoms. He is agreeable to this.   Collaboration of Care: Psychiatrist AEB 07/26/2022  Patient/Guardian was advised Release of Information must be obtained prior to any record release in order to collaborate their care with an outside provider. Patient/Guardian was advised if they have not already done so to contact the registration department to sign all necessary forms in order for [place] to release information regarding their care.   Consent: Patient/Guardian gives verbal consent for treatment and assignment of benefits for services provided during this visit. Patient/Guardian expressed understanding and agreed to proceed.   Allyson Sabal, [facility]

## 2022-07-26 NOTE — Patient Instructions (Addendum)
Thank you for attending your appointment today.  -- INCREASE Remeron to 30 mg nightly -- START propranolol 10 mg up to twice daily as needed for irritability/agitation -- START melatonin 3 mg qEvening -- Continue other medications as prescribed.  Please do not make any changes to medications without first discussing with your provider. If you are experiencing a psychiatric emergency, please call 911 or present to your nearest emergency department. Additional crisis, medication management, and therapy resources are included below.  [facility]  [address] [place], [place] [address] [phone] WALK-IN URGENT CARE 24/7 FOR ANYONE [address] [place], [place] Fax: [phone] [email or web address] *Interpreters available *Accepts all insurance and uninsured for Urgent Care needs *Accepts Medicaid and uninsured for outpatient treatment (below)      ONLY FOR [facility]  Below:    Outpatient New Patient Assessment/Therapy Walk-ins:        Monday -Thursday 8am until slots are full.        Every Friday 1pm-4pm  (first come, first served)                   New Patient Psychiatry/Medication Management        Monday-Friday 8am-11am (first come, first served)               For all walk-ins we ask that you arrive by 7:15am, because patients will be seen in the order of arrival.

## 2022-07-27 ENCOUNTER — Telehealth (HOSPITAL_COMMUNITY): Payer: Self-pay | Admitting: Psychiatry

## 2022-07-28 ENCOUNTER — Other Ambulatory Visit: Payer: Self-pay

## 2022-08-08 ENCOUNTER — Ambulatory Visit (INDEPENDENT_AMBULATORY_CARE_PROVIDER_SITE_OTHER): Payer: No Payment, Other | Admitting: Licensed Clinical Social Worker

## 2022-08-08 DIAGNOSIS — F332 Major depressive disorder, recurrent severe without psychotic features: Secondary | ICD-10-CM | POA: Diagnosis not present

## 2022-08-08 NOTE — Progress Notes (Signed)
   THERAPIST PROGRESS NOTE  Session Time: 36 minutes  Participation Level: Active  Behavioral Response: CasualAlertEuthymic  Type of Therapy: Individual Therapy  Treatment Goals addressed: Depression and anxiety  ProgressTowards Goals: Not Progressing  Interventions: CBT  Summary: Xavier Ramsey is a 31 y.o. male who presents with symptoms of depression and anxiety. Xavier Ramsey reports that since last visit he has continued to feel depressed. He states that he mostly keeps his thoughts and feelings bottled up and does not verbalize the to others. He processed feelings of anxiety surrounding his disability hearing coming up in the next week. He identified supports that he feels he can trust as his mother and his grandparents that will likely be accompanying him to his hearing. Xavier Ramsey begin working on a plan for how he will cope before, during and after the hearing. He reports that the only time he does feel motivated to do anything is when he is with his son. He reports that his depressed mood affects his sleep still and even with medication he sleeps for 4-5 hours and still feels tired the next day. He reports that his appetite has decreased and he eats one meal per day and he tries to eat and he will sometimes feel nauseous. Xavier Ramsey is provided with a copy of his assessment today and also anxiety tracker, feelings wheel and mood log to begin tracking his mood and anxiety/depression triggers. He denies SI/HI, AVH or psychosis.  Suicidal/Homicidal: Nowithout intent/plan  Therapist Response: CBT therapy. Assessed for issues of dangerousness, present MH symptoms, and assigned homework for Xavier Ramsey to work on in between sessions to identify, decrease and cope with his triggers that may arise. Informed Xavier Ramsey that Clinician would be leaving the office in the next two weeks and after next visit he will likely be paired with another therapist in office.  Plan: Return again in 2 weeks.  Diagnosis:  Severe episode of recurrent major depressive disorder, without psychotic features ([place])   Collaboration of Care: Medication Management AEB 08/24/2022  Patient/Guardian was advised Release of Information must be obtained prior to any record release in order to collaborate their care with an outside provider. Patient/Guardian was advised if they have not already done so to contact the registration department to sign all necessary forms in order for [place] to release information regarding their care.   Consent: Patient/Guardian gives verbal consent for treatment and assignment of benefits for services provided during this visit. Patient/Guardian expressed understanding and agreed to proceed.   Allyson Sabal, [facility] 08/08/2022

## 2022-08-18 ENCOUNTER — Ambulatory Visit
Admission: RE | Admit: 2022-08-18 | Discharge: 2022-08-18 | Disposition: A | Discharge status: Home / Self Care | Payer: No Typology Code available for payment source | Source: Ambulatory Visit | Attending: Neurology | Admitting: Neurology

## 2022-08-18 DIAGNOSIS — R413 Other amnesia: Secondary | ICD-10-CM

## 2022-08-18 DIAGNOSIS — G4700 Insomnia, unspecified: Secondary | ICD-10-CM

## 2022-08-18 DIAGNOSIS — R42 Dizziness and giddiness: Secondary | ICD-10-CM

## 2022-08-18 DIAGNOSIS — M542 Cervicalgia: Secondary | ICD-10-CM

## 2022-08-18 DIAGNOSIS — G8929 Other chronic pain: Secondary | ICD-10-CM

## 2022-08-18 DIAGNOSIS — F0781 Postconcussional syndrome: Secondary | ICD-10-CM

## 2022-08-18 MED ORDER — GADOPICLENOL 0.5 MMOL/ML IV SOLN
7.0000 mL | Freq: Once | INTRAVENOUS | Status: AC | PRN
  Administered 2022-08-18: 7 mL via INTRAVENOUS

## 2022-08-22 ENCOUNTER — Ambulatory Visit (HOSPITAL_COMMUNITY): Payer: No Payment, Other | Admitting: Licensed Clinical Social Worker

## 2022-08-24 ENCOUNTER — Encounter (HOSPITAL_COMMUNITY): Payer: Self-pay | Admitting: Psychiatry

## 2022-08-24 ENCOUNTER — Telehealth (INDEPENDENT_AMBULATORY_CARE_PROVIDER_SITE_OTHER): Payer: No Payment, Other | Admitting: Psychiatry

## 2022-08-24 DIAGNOSIS — F419 Anxiety disorder, unspecified: Secondary | ICD-10-CM | POA: Diagnosis not present

## 2022-08-24 DIAGNOSIS — F339 Major depressive disorder, recurrent, unspecified: Secondary | ICD-10-CM | POA: Diagnosis not present

## 2022-08-24 DIAGNOSIS — F0781 Postconcussional syndrome: Secondary | ICD-10-CM | POA: Diagnosis not present

## 2022-08-24 NOTE — Patient Instructions (Signed)
Thank you for attending your appointment today.  -- Due to concern for side effects, STOP Remeron at this time. If you notice worsening mood, sleep, nausea/appetite upon stopping you may restart Remeron at 7.5-15 mg nightly.  -- We can discuss your interest in starting an alternative medication for mood and anxiety at the next appointment. Please reach out to schedule an earlier appointment if symptoms worsen.  -- Continue other medications as prescribed.  Please do not make any changes to medications without first discussing with your provider. If you are experiencing a psychiatric emergency, please call 911 or present to your nearest emergency department. Additional crisis, medication management, and therapy resources are included below.  [facility]  [address] [place], [place] [address] [phone] WALK-IN URGENT CARE 24/7 FOR ANYONE [address], [place], [place] Fax: [phone] [email or web address] *Interpreters available *Accepts all insurance and uninsured for Urgent Care needs *Accepts Medicaid and uninsured for outpatient treatment (below)      ONLY FOR [facility]  Below:    Outpatient New Patient Assessment/Therapy Walk-ins:        Monday -Thursday 8am until slots are full.        Every Friday 1pm-4pm  (first come, first served)                   New Patient Psychiatry/Medication Management        Monday-Friday 8am-11am (first come, first served)               For all walk-ins we ask that you arrive by 7:15am, because patients will be seen in the order of arrival.
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
## 2022-08-25 ENCOUNTER — Ambulatory Visit (INDEPENDENT_AMBULATORY_CARE_PROVIDER_SITE_OTHER): Payer: No Payment, Other | Admitting: Licensed Clinical Social Worker

## 2022-08-25 DIAGNOSIS — F332 Major depressive disorder, recurrent severe without psychotic features: Secondary | ICD-10-CM | POA: Diagnosis not present

## 2022-08-25 DIAGNOSIS — F411 Generalized anxiety disorder: Secondary | ICD-10-CM

## 2022-08-26 NOTE — Progress Notes (Signed)
Virtual Visit via Video Note  I connected with Xavier Ramsey on 08/26/22 at  3:00 PM EST by a video enabled telemedicine application and verified that I am speaking with the correct person using two identifiers.  Location: Patient: Home Provider: Virtual office   I discussed the limitations of evaluation and management by telemedicine and the availability of in person appointments. The patient expressed understanding and agreed to proceed.  History of Present Illness: Xavier Ramsey presents with history of anxiety symptoms.   Observations/Objective: Ward is alert and oriented x5 for session. He reports that he has been feeling anxious everyday while waiting for his result from his disability hearing he completed on last week. He reports that she has been trying to relax and has been spending time with his son. 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  Assessment and Plan:  Xavier Ramsey is assessed for issues of dangerousness, mental status evaluated. Assessed for anxiety symptoms.  Follow Up Instructions: Follow up session is scheduled with new provider on 10/04/2022 and 09/30/2022 with the provider.   I discussed the assessment and treatment plan with the patient. The patient was provided an opportunity to ask questions and all were answered. The patient agreed with the plan and demonstrated an understanding of the instructions.   The patient was advised to call back or seek an in-person evaluation if the symptoms worsen or if the condition fails to improve as anticipated.  I provided 22 minutes of non-face-to-face time during this encounter.   Allyson Sabal, [facility]

## 2022-09-11 ENCOUNTER — Ambulatory Visit: Admit: 2022-09-11 | Payer: Medicaid Other

## 2022-09-30 ENCOUNTER — Telehealth (HOSPITAL_COMMUNITY): Payer: No Payment, Other | Admitting: Psychiatry

## 2022-10-04 ENCOUNTER — Emergency Department (HOSPITAL_BASED_OUTPATIENT_CLINIC_OR_DEPARTMENT_OTHER)
Admission: EM | Admit: 2022-10-04 | Discharge: 2022-10-04 | Discharge status: Against Medical Advice | Payer: Medicaid Other | Attending: Emergency Medicine | Admitting: Emergency Medicine

## 2022-10-04 ENCOUNTER — Other Ambulatory Visit: Payer: Self-pay

## 2022-10-04 ENCOUNTER — Encounter (HOSPITAL_BASED_OUTPATIENT_CLINIC_OR_DEPARTMENT_OTHER): Payer: Self-pay | Admitting: Emergency Medicine

## 2022-10-04 ENCOUNTER — Emergency Department (HOSPITAL_BASED_OUTPATIENT_CLINIC_OR_DEPARTMENT_OTHER): Payer: Medicaid Other

## 2022-10-04 ENCOUNTER — Ambulatory Visit (HOSPITAL_COMMUNITY): Payer: Self-pay | Admitting: Clinical

## 2022-10-04 ENCOUNTER — Ambulatory Visit (HOSPITAL_BASED_OUTPATIENT_CLINIC_OR_DEPARTMENT_OTHER): Admission: RE | Admit: 2022-10-04 | Payer: No Typology Code available for payment source | Source: Ambulatory Visit

## 2022-10-04 ENCOUNTER — Ambulatory Visit: Payer: No Typology Code available for payment source

## 2022-10-04 DIAGNOSIS — Z5321 Procedure and treatment not carried out due to patient leaving prior to being seen by health care provider: Secondary | ICD-10-CM | POA: Insufficient documentation

## 2022-10-04 DIAGNOSIS — M546 Pain in thoracic spine: Secondary | ICD-10-CM | POA: Diagnosis not present

## 2022-10-04 DIAGNOSIS — Y92410 Unspecified street and highway as the place of occurrence of the external cause: Secondary | ICD-10-CM | POA: Insufficient documentation
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
## 2022-10-26 ENCOUNTER — Encounter: Payer: Self-pay | Admitting: Neurology

## 2022-10-26 ENCOUNTER — Ambulatory Visit: Payer: Medicaid Other | Admitting: Neurology

## 2023-01-16 ENCOUNTER — Other Ambulatory Visit (HOSPITAL_COMMUNITY)
Admission: EM | Admit: 2023-01-16 | Discharge: 2023-01-23 | Payer: Medicaid Other | Attending: Psychiatry | Admitting: Psychiatry

## 2023-01-16 DIAGNOSIS — Z9151 Personal history of suicidal behavior: Secondary | ICD-10-CM | POA: Insufficient documentation

## 2023-01-16 DIAGNOSIS — F419 Anxiety disorder, unspecified: Secondary | ICD-10-CM | POA: Insufficient documentation

## 2023-01-16 DIAGNOSIS — F1020 Alcohol dependence, uncomplicated: Secondary | ICD-10-CM | POA: Diagnosis present

## 2023-01-16 DIAGNOSIS — Z79899 Other long term (current) drug therapy: Secondary | ICD-10-CM | POA: Insufficient documentation

## 2023-01-16 DIAGNOSIS — F331 Major depressive disorder, recurrent, moderate: Secondary | ICD-10-CM | POA: Insufficient documentation

## 2023-01-16 DIAGNOSIS — F1290 Cannabis use, unspecified, uncomplicated: Secondary | ICD-10-CM

## 2023-01-16 DIAGNOSIS — F1910 Other psychoactive substance abuse, uncomplicated: Secondary | ICD-10-CM

## 2023-01-16 NOTE — Progress Notes (Signed)
   01/16/23 2340  [facility] Triage Screening (Walk-ins at [facility] only)  How Did You Hear About [place]? Family/Friend  What Is the Reason for Your Visit/Call Today? Pt presents to [facility] voluntarily, accompanied by his mother requesting alcohol detox. Pt reports that he was assessed at [facility] and needed to complete detox before being considered for long term placement. Pt used alcohol (liquor) today, but unsure of exact amount. Pt denies SI, HI, AVH.  How Long Has This Been Causing You Problems? <Week  Have You Recently Had Any Thoughts About Hurting Yourself? No  Are You Planning to Commit Suicide/Harm Yourself At This time? No  Have you Recently Had Thoughts About Hurting Someone Karolee Ohs? No  Are You Planning To Harm Someone At This Time? No  Are you currently experiencing any auditory, visual or other hallucinations? No  Have You Used Any Alcohol or Drugs in the Past 24 Hours? Yes  How long ago did you use Drugs or Alcohol? today  What Did You Use and How Much? liquor (unknown amount)  Do you have any current medical co-morbidities that require immediate attention? No  Clinician description of patient physical appearance/behavior: casually dressed, cooperative  What Do You Feel Would Help You the Most Today? Alcohol or Drug Use Treatment  If access to [facility] was not available, would you have sought care in the Emergency Department? No  Determination of Need Routine (7 days)  Options For Referral Other: Comment;Outpatient Therapy;Facility-Based Crisis

## 2023-01-17 DIAGNOSIS — F419 Anxiety disorder, unspecified: Secondary | ICD-10-CM | POA: Diagnosis not present

## 2023-01-17 DIAGNOSIS — Z9151 Personal history of suicidal behavior: Secondary | ICD-10-CM | POA: Diagnosis not present

## 2023-01-17 DIAGNOSIS — F1020 Alcohol dependence, uncomplicated: Secondary | ICD-10-CM | POA: Diagnosis present

## 2023-01-17 DIAGNOSIS — F331 Major depressive disorder, recurrent, moderate: Secondary | ICD-10-CM | POA: Diagnosis not present

## 2023-01-17 LAB — POCT URINE DRUG SCREEN - MANUAL ENTRY (I-SCREEN)
POC Amphetamine UR: NOT DETECTED
POC Buprenorphine (BUP): NOT DETECTED
POC Cocaine UR: NOT DETECTED
POC Marijuana UR: POSITIVE — AB
POC Methadone UR: NOT DETECTED
POC Methamphetamine UR: NOT DETECTED
POC Morphine: NOT DETECTED
POC Oxazepam (BZO): POSITIVE — AB
POC Oxycodone UR: NOT DETECTED
POC Secobarbital (BAR): NOT DETECTED

## 2023-01-17 LAB — CBC WITH DIFFERENTIAL/PLATELET
Abs Immature Granulocytes: 0.01 10*3/uL (ref 0.00–0.07)
Basophils Absolute: 0 10*3/uL (ref 0.0–0.1)
Basophils Relative: 1 %
Eosinophils Absolute: 0 10*3/uL (ref 0.0–0.5)
Eosinophils Relative: 0 %
HCT: 38.1 % — ABNORMAL LOW (ref 39.0–52.0)
Hemoglobin: 13.5 g/dL (ref 13.0–17.0)
Immature Granulocytes: 0 %
Lymphocytes Relative: 48 %
Lymphs Abs: 2.4 10*3/uL (ref 0.7–4.0)
MCH: 32.8 pg (ref 26.0–34.0)
MCHC: 35.4 g/dL (ref 30.0–36.0)
MCV: 92.7 fL (ref 80.0–100.0)
Monocytes Absolute: 0.5 10*3/uL (ref 0.1–1.0)
Monocytes Relative: 9 %
Neutro Abs: 2.1 10*3/uL (ref 1.7–7.7)
Neutrophils Relative %: 42 %
Platelets: 254 10*3/uL (ref 150–400)
RBC: 4.11 MIL/uL — ABNORMAL LOW (ref 4.22–5.81)
RDW: 12.8 % (ref 11.5–15.5)
WBC: 5 10*3/uL (ref 4.0–10.5)
nRBC: 0 % (ref 0.0–0.2)

## 2023-01-17 LAB — LIPID PANEL
Cholesterol: 173 mg/dL (ref 0–200)
HDL: 65 mg/dL (ref >40)
LDL Cholesterol: 68 mg/dL (ref 0–99)
Total CHOL/HDL Ratio: 2.7 RATIO
Triglycerides: 199 mg/dL — ABNORMAL HIGH (ref <150)
VLDL: 40 mg/dL (ref 0–40)

## 2023-01-17 LAB — TSH: TSH: 1.445 u[IU]/mL (ref 0.350–4.500)

## 2023-01-17 LAB — COMPREHENSIVE METABOLIC PANEL
ALT: 104 U/L — ABNORMAL HIGH (ref 0–44)
AST: 128 U/L — ABNORMAL HIGH (ref 15–41)
Albumin: 4.2 g/dL (ref 3.5–5.0)
Alkaline Phosphatase: 88 U/L (ref 38–126)
Anion gap: 14 (ref 5–15)
BUN: 9 mg/dL (ref 6–20)
CO2: 24 mmol/L (ref 22–32)
Calcium: 9.2 mg/dL (ref 8.9–10.3)
Chloride: 97 mmol/L — ABNORMAL LOW (ref 98–111)
Creatinine, Ser: 1.2 mg/dL (ref 0.61–1.24)
GFR, Estimated: >60 mL/min (ref >60)
Glucose, Bld: 135 mg/dL — ABNORMAL HIGH (ref 70–99)
Potassium: 3.6 mmol/L (ref 3.5–5.1)
Sodium: 135 mmol/L (ref 135–145)
Total Bilirubin: 1.4 mg/dL — ABNORMAL HIGH (ref 0.3–1.2)
Total Protein: 7.4 g/dL (ref 6.5–8.1)

## 2023-01-17 LAB — HEMOGLOBIN A1C
Hgb A1c MFr Bld: 4.9 % (ref 4.8–5.6)
Mean Plasma Glucose: 93.93 mg/dL

## 2023-01-17 LAB — ETHANOL: Alcohol, Ethyl (B): 177 mg/dL — ABNORMAL HIGH (ref <10)

## 2023-01-17 MED ORDER — ADULT MULTIVITAMIN W/MINERALS CH
1.0000 | ORAL_TABLET | Freq: Every day | ORAL | Status: DC
  Administered 2023-01-17 – 2023-01-23 (×7): 1 via ORAL
  Filled 2023-01-17 (×7): qty 1

## 2023-01-17 MED ORDER — LORAZEPAM 1 MG PO TABS: 1.0000 mg | ORAL_TABLET | Freq: Every day | ORAL | Status: DC

## 2023-01-17 MED ORDER — HYDROXYZINE HCL 25 MG PO TABS
25.0000 mg | ORAL_TABLET | Freq: Four times a day (QID) | ORAL | Status: AC | PRN
  Administered 2023-01-17 – 2023-01-19 (×3): 25 mg via ORAL
  Filled 2023-01-17 (×3): qty 1

## 2023-01-17 MED ORDER — THIAMINE HCL 100 MG/ML IJ SOLN
100.0000 mg | Freq: Once | INTRAMUSCULAR | Status: AC
  Administered 2023-01-17: 100 mg via INTRAMUSCULAR
  Filled 2023-01-17: qty 2

## 2023-01-17 MED ORDER — ACETAMINOPHEN 325 MG PO TABS: 650.0000 mg | ORAL_TABLET | Freq: Four times a day (QID) | ORAL | Status: DC | PRN

## 2023-01-17 MED ORDER — ONDANSETRON 4 MG PO TBDP: 4.0000 mg | ORAL_TABLET | Freq: Four times a day (QID) | ORAL | Status: AC | PRN

## 2023-01-17 MED ORDER — LORAZEPAM 1 MG PO TABS
1.0000 mg | ORAL_TABLET | Freq: Four times a day (QID) | ORAL | Status: DC
  Filled 2023-01-17: qty 1

## 2023-01-17 MED ORDER — LORAZEPAM 1 MG PO TABS: 1.0000 mg | ORAL_TABLET | Freq: Two times a day (BID) | ORAL | Status: DC

## 2023-01-17 MED ORDER — LORAZEPAM 1 MG PO TABS: 1.0000 mg | ORAL_TABLET | Freq: Three times a day (TID) | ORAL | Status: DC

## 2023-01-17 MED ORDER — ENSURE ENLIVE PO LIQD
237.0000 mL | Freq: Two times a day (BID) | ORAL | Status: DC
  Administered 2023-01-17 – 2023-01-23 (×12): 237 mL via ORAL
  Filled 2023-01-17 (×2): qty 237

## 2023-01-17 MED ORDER — MAGNESIUM HYDROXIDE 400 MG/5ML PO SUSP: 30.0000 mL | Freq: Every day | ORAL | Status: DC | PRN

## 2023-01-17 MED ORDER — TRAZODONE HCL 100 MG PO TABS
100.0000 mg | ORAL_TABLET | Freq: Every evening | ORAL | Status: DC | PRN
  Administered 2023-01-17 – 2023-01-22 (×4): 100 mg via ORAL
  Filled 2023-01-17: qty 14
  Filled 2023-01-17 (×4): qty 1

## 2023-01-17 MED ORDER — THIAMINE MONONITRATE 100 MG PO TABS
100.0000 mg | ORAL_TABLET | Freq: Every day | ORAL | Status: DC
  Administered 2023-01-18 – 2023-01-23 (×6): 100 mg via ORAL
  Filled 2023-01-17 (×6): qty 1

## 2023-01-17 MED ORDER — MELATONIN 3 MG PO TABS
3.0000 mg | ORAL_TABLET | Freq: Every day | ORAL | Status: DC
  Administered 2023-01-17 – 2023-01-18 (×2): 3 mg via ORAL
  Filled 2023-01-17 (×2): qty 1

## 2023-01-17 MED ORDER — LORAZEPAM 1 MG PO TABS
1.0000 mg | ORAL_TABLET | Freq: Four times a day (QID) | ORAL | Status: AC | PRN
  Administered 2023-01-17: 1 mg via ORAL
  Filled 2023-01-17: qty 1

## 2023-01-17 MED ORDER — LOPERAMIDE HCL 2 MG PO CAPS: 2.0000 mg | ORAL_CAPSULE | ORAL | Status: AC | PRN

## 2023-01-17 MED ORDER — CARVEDILOL 3.125 MG PO TABS
3.1250 mg | ORAL_TABLET | Freq: Once | ORAL | Status: AC
  Administered 2023-01-17: 3.125 mg via ORAL
  Filled 2023-01-17: qty 1

## 2023-01-17 MED ORDER — ALUM & MAG HYDROXIDE-SIMETH 200-200-20 MG/5ML PO SUSP: 30.0000 mL | ORAL | Status: DC | PRN

## 2023-01-17 NOTE — ED Notes (Signed)
Pt presents to [facility] voluntarily requesting alcohol detox. Patient A&Ox4. Patient denies SI/HI and AVH. Patient denies any physical complaints when asked. No acute distress noted. Support and encouragement provided. Routine safety checks conducted according to facility protocol. Encouraged patient to notify staff if thoughts of harm toward self or others arise. Patient verbalize understanding and agreement. Patient oriented to unit. Meal given. Q 15 min safety checks in place. Will continue to monitor for safety.

## 2023-01-17 NOTE — ED Notes (Signed)
Patient resting quietly in bed with eyes closed. Respirations equal and unlabored, skin warm and dry, NAD. No change in assessment or acuity. Q 15 minute safety checks remain in place.   

## 2023-01-17 NOTE — ED Notes (Signed)
Patient is awake and alert on unit at this time.  He ate dinner and is now looking at books to select one to read.  Patient is calm and cooperative.  No somatic complaints and no withdrawal at this time.  Will monitor.

## 2023-01-17 NOTE — ED Provider Notes (Signed)
Facility Based Crisis Admission H&P  Date: 01/17/23 Patient Name: Xavier Ramsey MRN: [id number] Chief Complaint: "I want to get detox from alcohol".  Diagnoses:  Final diagnoses:  Alcohol use disorder, severe, dependence (HCC)  Moderate episode of recurrent major depressive disorder (HCC)  Substance abuse (HCC)  Marijuana user    HPI: Xavier Ramsey. 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   Patient was seen alone face-to-face by this provider's chart reviewed.  Per chart review, patient has been [facility] at [facility] and was in need to completely detox from alcohol and was looking for long-term placement/rehab. Patient's last visit to his outpatient psychiatrist was 08/24/22 for medication management.   Patient reports "I came because I had called here earlier for help with detox from alcohol and they told me I can detox here for a few days and then be placed in 30 day program for long-term treatment".   Patient reports " I had talked to my counselor a couple of months ago, and she recommended I do a detox first before I go somewhere for long term/30 day treatment, and since then, I've been looking for the best place, before I was advised to call here. I have 3 DUI's, the first was in 2021, the second was July 4th 2023, and most recently was January 2024. I have been drinking alcohol since age 63, and I drink 1/5 liquor daily, last drank earlier today. I started back drinking 2 weeks ago and before then I was a month and a week sober".  Patient endorses a history of withdrawal symptoms and denies history of withdrawal seizures.  Patient denies history of detox treatment.  Patient reports a history of daily marijuana use before he sleeps and describes his sleep and appetite as  poor.   Patient reports he is unemployed and lives with his mom. Patient denies access to a gun or weapon.   Patient denies SI/HI/AVH or paranoia. Patient has a history of suicide attempt in 2009-2010.   Patient reports he is not currently established with outpatient psychiatric services and not taking any psychiatric medications.   Patient completed the PHQ-9 questionnaire and obtained a score of 14, indicating moderate depression.   On evaluation, patient is alert, oriented x 4, and cooperative. Speech is clear and coherent. Pt appears casual. Eye contact is good. Mood is euthymic, affect is congruent with mood. Thought process is coherent and thought content is WDL. Pt denies SI/HI/AVH or paranoia. There is no objective indication that the patient is responding to internal stimuli. No delusions elicited during this assessment.    PHQ 2-9:  Flowsheet Row ED from 01/16/2023 in [facility] Counselor from 07/26/2022 in [facility] Video Visit from 07/06/2021 in [facility] at [facility]  Thoughts that you would be better off dead, or of hurting yourself in some way Not at all Not at all Not at all  PHQ-9 Total Score 14 20 14        Flowsheet Row ED from 01/16/2023 in [facility] ED from 10/04/2022 in [facility] Emergency Department at [facility] Counselor from 07/26/2022 in [facility]  C-SSRS RISK CATEGORY No Risk No Risk Low Risk  Total Time spent with patient: 20 minutes  Musculoskeletal  Strength & Muscle Tone: within normal limits Gait & Station: normal Patient leans: N/A  Psychiatric Specialty Exam  Presentation General Appearance:  Appropriate for Environment  Eye Contact: Good  Speech: Clear and Coherent  Speech Volume: Normal  Handedness: Right   Mood and Affect  Mood: Euthymic  Affect: Congruent   Thought  Process  Thought Processes: Coherent  Descriptions of Associations:Intact  Orientation:Full (Time, Place and Person)  Thought Content:WDL  Diagnosis of Schizophrenia or Schizoaffective disorder in past: No   Hallucinations:Hallucinations: None  Ideas of Reference:None  Suicidal Thoughts:Suicidal Thoughts: No  Homicidal Thoughts:Homicidal Thoughts: No   Sensorium  Memory: Immediate Good  Judgment: Poor  Insight: Fair   Art therapist  Concentration: Good  Attention Span: Good  Recall: Good  Fund of Knowledge: Good  Language: Good   Psychomotor Activity  Psychomotor Activity: Psychomotor Activity: Normal   Assets  Assets: Communication Skills; Desire for Improvement; Resilience; Social Support   Sleep  Sleep: Sleep: Poor   Nutritional Assessment (For OBS and FBC admissions only) Has the patient had a weight loss or gain of 10 pounds or more in the last 3 months?: No Has the patient had a decrease in food intake/or appetite?: No Does the patient have dental problems?: No Does the patient have eating habits or behaviors that may be indicators of an eating disorder including binging or inducing vomiting?: No Has the patient recently lost weight without trying?: 0 Has the patient been eating poorly because of a decreased appetite?: 0 Malnutrition Screening Tool Score: 0    Physical Exam Constitutional:      General: He is not in acute distress.    Appearance: He is not diaphoretic.  HENT:     Head: Normocephalic.     Right Ear: External ear normal.     Left Ear: External ear normal.     Nose: No congestion.  Eyes:     General:        Right eye: No discharge.        Left eye: No discharge.  Cardiovascular:     Rate and Rhythm: Normal rate.  Pulmonary:     Effort: No respiratory distress.  Chest:     Chest wall: No tenderness.  Neurological:     Mental Status: He is alert and oriented to person, place, and time.  Psychiatric:         Attention and Perception: Attention and perception normal.        Mood and Affect: Mood is depressed.        Speech: Speech normal.        Behavior: Behavior is cooperative.        Thought Content: Thought content normal. Thought content is not paranoid or delusional. Thought content does not include homicidal or suicidal ideation. Thought content does not include homicidal or suicidal plan.        Cognition and Memory: Cognition normal.    Review of Systems  Constitutional:  Negative for chills, diaphoresis and fever.  HENT:  Negative for congestion.   Eyes:  Negative for discharge.  Respiratory:  Negative for cough, shortness of breath and wheezing.   Cardiovascular:  Negative for chest pain and palpitations.  Gastrointestinal:  Negative for diarrhea, nausea and vomiting.  Neurological:  Negative for dizziness, seizures, loss of consciousness and weakness.  Psychiatric/Behavioral:  Positive for depression and substance abuse.     Blood pressure 114/68, pulse (!) 126,  temperature 98.9 F (37.2 C), temperature source Oral, resp. rate 18, SpO2 98 %. There is no height or weight on file to calculate BMI.  Past Psychiatric History: Diagnoses: MDD, postconcussive syndrome Medication trials: Celexa (only took briefly - "out of it"); Remeron 15 mg (ineffective for nausea/appetite stimulation; oversedation) Previous psychiatrist/therapist: briefly saw a therapist in high school related to anger issues after father moved to another state and again in college after experiencing a concussion and impacting ability to play football Hospitalizations:  x1 at 32 yo after suicide attempt Suicide attempts: aborted SA x1 at 32 yo via attempted hanging Current access to guns: denies Hx of abuse: denies Head trauma: endorses history of several concussions while playing contact sports (basketball/football) from 2006-2009; estimates 10-20 concussions in total Substance use:              -- Tobacco:  denies             -- Etoh: a few times per month; 2 drinks each sitting             -- Cannabis: reports last used > 1 year ago             -- Denies illicit drug use   Is the patient at risk to self? No  Has the patient been a risk to self in the past 6 months? No .    Has the patient been a risk to self within the distant past? Yes   Is the patient a risk to others? No   Has the patient been a risk to others in the past 6 months? No   Has the patient been a risk to others within the distant past? No   Past Medical History:   Anxiety      [employer] 10/21/2020   Depression     Substance abuse (HCC)      [employer] 10/21/2020   Family History: N/A Social History: N/A  Last Labs:  Admission on 01/16/2023  Component Date Value Ref Range Status   POC Amphetamine UR 01/17/2023 None Detected  NONE DETECTED (Cut Off Level 1000 ng/mL) Final   POC Secobarbital (BAR) 01/17/2023 None Detected  NONE DETECTED (Cut Off Level 300 ng/mL) Final   POC Buprenorphine (BUP) 01/17/2023 None Detected  NONE DETECTED (Cut Off Level 10 ng/mL) Final   POC Oxazepam (BZO) 01/17/2023 Positive (A)  NONE DETECTED (Cut Off Level 300 ng/mL) Final   POC Cocaine UR 01/17/2023 None Detected  NONE DETECTED (Cut Off Level 300 ng/mL) Final   POC Methamphetamine UR 01/17/2023 None Detected  NONE DETECTED (Cut Off Level 1000 ng/mL) Final   POC Morphine 01/17/2023 None Detected  NONE DETECTED (Cut Off Level 300 ng/mL) Final   POC Methadone UR 01/17/2023 None Detected  NONE DETECTED (Cut Off Level 300 ng/mL) Final   POC Oxycodone UR 01/17/2023 None Detected  NONE DETECTED (Cut Off Level 100 ng/mL) Final   POC Marijuana UR 01/17/2023 Positive (A)  NONE DETECTED (Cut Off Level 50 ng/mL) Final    Allergies: Patient has no known allergies.  Medications:  Facility Ordered Medications  Medication   acetaminophen (TYLENOL) tablet 650 mg   alum & mag hydroxide-simeth (MAALOX/MYLANTA) 200-200-20 MG/5ML suspension 30 mL    magnesium hydroxide (MILK OF MAGNESIA) suspension 30 mL   thiamine (VITAMIN B1) injection 100 mg   [START ON 01/18/2023] thiamine (VITAMIN B1) tablet 100 mg   multivitamin with minerals tablet 1 tablet   LORazepam (ATIVAN) tablet 1 mg  hydrOXYzine (ATARAX) tablet 25 mg   loperamide (IMODIUM) capsule 2-4 mg   ondansetron (ZOFRAN-ODT) disintegrating tablet 4 mg   LORazepam (ATIVAN) tablet 1 mg   Followed by   Melene Muller ON 01/18/2023] LORazepam (ATIVAN) tablet 1 mg   Followed by   Melene Muller ON 01/19/2023] LORazepam (ATIVAN) tablet 1 mg   Followed by   Melene Muller ON 01/20/2023] LORazepam (ATIVAN) tablet 1 mg   carvedilol (COREG) tablet 3.125 mg   PTA Medications  Medication Sig   lidocaine (LIDODERM) 5 % Place 1 patch onto the skin daily. Remove & Discard patch within 12 hours or as directed by MD   methocarbamol (ROBAXIN) 500 MG tablet Take 1 tablet (500 mg total) by mouth 2 (two) times daily.   ondansetron (ZOFRAN) 4 MG tablet Take 1 tablet (4 mg total) by mouth every 8 (eight) hours as needed for nausea or vomiting.   omeprazole (PRILOSEC) 20 MG capsule Take 20 mg by mouth daily.   ondansetron (ZOFRAN) 8 MG tablet Take 8 mg by mouth daily.   cyanocobalamin (VITAMIN B12) 500 MCG tablet Take 500 mcg by mouth daily.   thiamine (VITAMIN B-1) 100 MG tablet Take 100 mg by mouth daily.   melatonin 3 MG TABS tablet Take 1 tablet (3 mg total) by mouth at bedtime.    Long Term Goals: Improvement in symptoms so as ready for discharge  Short Term Goals: Patient will verbalize feelings in meetings with treatment team members., Patient will attend at least of 50% of the groups daily., Pt will complete the PHQ9 on admission, day 3 and discharge., Patient will participate in completing the [place] Suicide Severity Rating Scale, Patient will score a low risk of violence for 24 hours prior to discharge, and Patient will take medications as prescribed daily.  Medical Decision Making  Recommend admission to the [facility]  for substance abuse treatment/detox/AUD.  Lab Orders         CBC with Differential/Platelet         Comprehensive metabolic panel         Hemoglobin A1c         Lipid panel         TSH         Ethanol         POCT Urine Drug Screen - (I-Screen)     EKG Recommend CIWA protocol Initiate CIWA protocol -lorazepam 1 mg every 6 hours prn for CIWA >10 -thiamine 100 mg daily for nutritional supplementation -hydroxyzine 25 mg every 6 hours prn for anxiety, CIWA < or = 10 -ondansetron 4 mg ODT every 6 hours prn nausea/vomiting -loperamide 2-4 mg capsule prn diarrhea or loose stools    Other prns -Tylenol 650 mg PO q6h prn pain -Maalox 30 ml PO q4h prn indigestion -MOM 300 ml PO daily prn, constipation  Recommendations  Based on my evaluation the patient does not appear to have an emergency medical condition.  Recommend admission to the [facility] for substance abuse treatment/detox/AUD. Recommend CIWA protocol  Mancel Bale, NP 01/17/23  1:18 AM

## 2023-01-17 NOTE — BH Assessment (Signed)
Comprehensive Clinical Assessment (CCA) Note  01/17/2023 Xavier Ramsey [id number]  Disposition: Mancel Bale, NP recommends Facility-Based Crisis.  The patient demonstrates the following risk factors for suicide: Chronic risk factors for suicide include: substance use disorder and previous suicide attempts by hanging . Acute risk factors for suicide include: N/A. Protective factors for this patient include: hope for the future. Considering these factors, the overall suicide risk at this point appears to be none. Patient is not appropriate for outpatient follow up.  Xavier Ramsey is a 32 year old single male who presents voluntarily to [facility] and accompanied by his mother, seeking detox from alcohol. 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 Patient states he was assessed [facility] and was told he needs to completing detox before seeking long-term rehab. Patient states he had three to four shots of vodka today.  Patient says he has court dates on May 17 and June 13 for DUIs. Patient reports he first stated drinking when he was 32 years old. He says he drinks about a fifth daily. Patient reports he was sober for 1 month, until he relapsed two weeks ago. Patient acknowledges withdrawal symptoms to include sweating, shaking and nausea. He denies a history of seizures. Patient reports he has never attempted to detox.  Patient denies current depressive symptoms, however, does endorse poor sleep and appetite. Patient states he attempted suicide in 2009, by hanging, and was hospitalized at that time. Patient denies current SI, HI, auditory or visual hallucinations.   Patient denies current stressors. Patient lives with his mother who is his primary support. Patient is unemployed and has filed for disability. Patient denies any history of abuse or trauma.  Patient is dressed casually, alert, and oriented x4 with normal speech.  Patient's speech is coherent, and he makes good eye contact. His mood is euthymic and there is no indication he is responding to internal stimuli. Patient was cooperative throughout the assessment.    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 Family/Friend  What Is the Reason for Your Visit/Call Today? Pt presents to [facility] voluntarily, accompanied by his mother requesting alcohol detox. Pt reports that he was assessed at [facility] and needed to complete detox before being considered for long term placement. Pt used alcohol (liquor) today, but unsure of exact amount. Pt denies SI, HI, AVH.  How Long Has This Been Causing You Problems? > than 6 months  What Do You Feel Would Help You the Most Today? Alcohol or Drug Use Treatment   Have You Recently Had Any Thoughts About Hurting Yourself? No  Are You Planning to Commit Suicide/Harm Yourself At This time? No   Flowsheet Row ED from 01/16/2023 in [facility] ED from 10/04/2022 in [facility] Emergency Department at [facility] Counselor from 07/26/2022 in [facility]  C-SSRS RISK CATEGORY No Risk No Risk Low Risk       Have you Recently Had Thoughts About Hurting Someone Xavier Ramsey? No  Are You Planning to Harm Someone at This Time? No  Explanation: N/A   Have You Used Any Alcohol or Drugs in the Past 24 Hours? Yes  What Did You Use and How Much? 3-4 shots of Vodka   Do You Currently Have a Therapist/Psychiatrist? No  Name of Therapist/Psychiatrist: Name of Therapist/Psychiatrist: N/A   Have You Been Recently Discharged From Any Office Practice or Programs?  No  Explanation of Discharge From Practice/Program: N/A     CCA Screening Triage Referral Assessment Type of Contact: Face-to-Face  Telemedicine Service Delivery:   Is this Initial or Reassessment?   Date Telepsych consult ordered in [facility]:    Time Telepsych consult ordered in [facility]:    Location of Assessment: [facility] Services  Provider Location: [facility] Assessment Services   Collateral Involvement: None   Does Patient Have a Automotive engineer Guardian? No  Legal Guardian Contact Information: N/A  Copy of Legal Guardianship Form: -- (N/A)  Legal Guardian Notified of Arrival: -- (N/A)  Legal Guardian Notified of Pending Discharge: -- (N/A)  If Minor and Not Living with Parent(s), Who has Custody? N/A  Is CPS involved or ever been involved? Never  Is APS involved or ever been involved? Never   Patient Determined To Be At Risk for Harm To Self or Others Based on Review of Patient Reported Information or Presenting Complaint? No (denies SI/HI)  Method: No Plan (denies SI/HI)  Availability of Means: No access or NA (denies SI/HI)  Intent: Vague intent or NA (denies SI/HI)  Notification Required: No need or identified person (denies SI/HI)  Additional Information for Danger to Others Potential: Previous attempts  Additional Comments for Danger to Others Potential: N/A  Are There Guns or Other Weapons in Your Home? No  Types of Guns/Weapons: N/A  Are These Weapons Safely Secured?                            -- (N/A)  Who Could Verify You Are Able To Have These Secured: N/A  Do You Have any Outstanding Charges, Pending Court Dates, Parole/Probation? Patient has court May 13 & June 17 for DUI  Contacted To Inform of Risk of Harm To Self or Others: -- (N/A)    Does Patient Present under Involuntary Commitment? No    [place] of Residence: [place]   Patient Currently Receiving the Following Services: Not Receiving Services   Determination of  Need: Urgent (48 hours)   Options For Referral: Facility-Based Crisis     CCA Biopsychosocial Patient Reported Schizophrenia/Schizoaffective Diagnosis in Past: No   Strengths: Patient seeking treatment for alcohol abuse   Mental Health Symptoms Depression:   None   Duration of Depressive symptoms:    Mania:   None   Anxiety:    None   Psychosis:   None   Duration of Psychotic symptoms:    Trauma:   None   Obsessions:   None   Compulsions:   None   Inattention:   None   Hyperactivity/Impulsivity:   None   Oppositional/Defiant Behaviors:   None   Emotional Irregularity:   None   Other Mood/Personality Symptoms:  N/A    Mental Status Exam Appearance and self-care  Stature:   Average   Weight:   Average weight   Clothing:   Casual   Grooming:   Normal   Cosmetic use:   None   Posture/gait:   Normal   Motor activity:   Not Remarkable   Sensorium  Attention:   Normal   Concentration:   Normal   Orientation:   X5   Recall/memory:  Normal   Affect and Mood  Affect:   Appropriate   Mood:   Euthymic   Relating  Eye contact:   Normal   Facial expression:   Responsive   Attitude toward examiner:   Cooperative   Thought and Language  Speech flow:  Normal   Thought content:   Appropriate to Mood and Circumstances   Preoccupation:   None   Hallucinations:   None   Organization:   Goal-directed; Linear; Coherent   [employer] of Knowledge:   Good   Intelligence:   Average   Abstraction:   Normal   Judgement:   Good   Reality Testing:   Realistic   Insight:   Good   Decision Making:   Normal   Social Functioning  Social Maturity:   Responsible   Social Judgement:   Normal   Stress  Stressors:   Other (Comment) (Patient denies current stressors)   Coping Ability:   Normal   Skill Deficits:   None   Supports:   Family      Religion: Religion/Spirituality Are You A Religious Person?: Yes What is Your Religious Affiliation?: Christian How Might This Affect Treatment?: N/A  Leisure/Recreation: Leisure / Recreation Do You Have Hobbies?: No  Exercise/Diet: Exercise/Diet Do You Exercise?: No Have You Gained or Lost A Significant Amount of Weight in the Past Six Months?: No Do You Follow a Special Diet?: No Do You Have Any Trouble Sleeping?: Yes Explanation of Sleeping Difficulties: Patient states he has poor sleep when drinking   CCA Employment/Education Employment/Work Situation: Employment / Work Situation Employment Situation: Unemployed Patient's Job has Been Impacted by Current Illness: No Has Patient ever Been in Equities trader?: No  Education: Education Is Patient Currently Attending School?: No Last Grade Completed: 12 Did You Product manager?: Yes What Type of College Degree Do you Have?: Some college Did You Have An Individualized Education Program (IIEP): No Did You Have Any Difficulty At School?: No Patient's Education Has Been Impacted by Current Illness: No   CCA Family/Childhood History Family and Relationship History: Family history Marital status: Single Does patient have children?: Yes How many children?: 1 How is patient's relationship with their children?: Positive relationship  Childhood History:  Childhood History By whom was/is the patient raised?: Mother Did patient suffer any verbal/emotional/physical/sexual abuse as a child?: No Did patient suffer from severe childhood neglect?: No Has patient ever been sexually abused/assaulted/raped as an adolescent or adult?: No Was the patient ever a victim of a crime or a disaster?: No Witnessed domestic violence?: No Has patient been affected by domestic violence as an adult?: No       CCA Substance Use Alcohol/Drug Use: Alcohol / Drug Use Pain Medications: See MAR Prescriptions: See MAR Over the Counter: See  MAR History of alcohol / drug use?: Yes Longest period of sobriety (when/how long): 1 month, 1 week Negative Consequences of Use: Legal Withdrawal Symptoms: Sweats, Nausea / Vomiting, Tremors Substance #1 Name of Substance 1: Alcohol 1 - Age of First Use: 10 1 - Amount (size/oz): 1/5 1 - Frequency: Daily 1 - Duration: Ongoing 1 - Last Use / Amount: Today 1 - Method of Aquiring: Purchase 1- Route of Use: Drinks orally                       ASAM's:  Six Dimensions of Multidimensional Assessment  Dimension 1:  Acute Intoxication and/or Withdrawal Potential:   Dimension 1:  Description of  individual's past and current experiences of substance use and withdrawal: Patient reports withdrawal symptoms to include shaking, sweaking, and sickness/nausea.  Dimension 2:  Biomedical Conditions and Complications:   Dimension 2:  Description of patient's biomedical conditions and  complications: Patient reports an old head injury. Denies additional health or physical conditions.  Dimension 3:  Emotional, Behavioral, or Cognitive Conditions and Complications:  Dimension 3:  Description of emotional, behavioral, or cognitive conditions and complications: Patient has a diagnosis of MDD, anxiety and mood disorder.  Dimension 4:  Readiness to Change:  Dimension 4:  Description of Readiness to Change criteria: Patient interested in engaging in long-term treatment  Dimension 5:  Relapse, Continued use, or Continued Problem Potential:  Dimension 5:  Relapse, continued use, or continued problem potential critiera description: Patient was sober 1 month, 1 week before relapsing  Dimension 6:  Recovery/Living Environment:  Dimension 6:  Recovery/Iiving environment criteria description: Patient lives with his mother who is supportive  ASAM Severity Score: ASAM's Severity Rating Score: 4  ASAM Recommended Level of Treatment: ASAM Recommended Level of Treatment: Level I Outpatient Treatment   Substance use  Disorder (SUD) Substance Use Disorder (SUD)  Checklist Symptoms of Substance Use: Continued use despite persistent or recurrent social, interpersonal problems, caused or exacerbated by use, Persistent desire or unsuccessful efforts to cut down or control use, Recurrent use that results in a failure to fulfill major role obligations (work, school, home)  Recommendations for Services/Supports/Treatments: Recommendations for Services/Supports/Treatments Recommendations For Services/Supports/Treatments: Facility Based Crisis  Discharge Disposition:    DSM5 Diagnoses: Patient Active Problem List   Diagnosis Date Noted   Alcohol use disorder, severe, dependence (HCC) 01/17/2023   MDD (major depressive disorder), recurrent episode (HCC) 07/26/2022   Anxiety 07/26/2022   Postconcussive syndrome 07/26/2022   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

## 2023-01-17 NOTE — Group Note (Signed)
Group Topic: Coping Skills  Group Date: 01/17/2023 Start Time: 0130 End Time: 0300 Facilitators: Lenny Pastel  Department: [facility]  Number of Participants: 7  Group Focus: clarity of thought, communication, coping skills, impulsivity, personal responsibility, relapse prevention, self-awareness, and substance abuse education Treatment Modality:  Cognitive Behavioral Therapy Interventions utilized were assignment, exploration, and group exercise Purpose: enhance coping skills, explore maladaptive thinking, express feelings, express irrational fears, improve communication skills, increase insight, regain self-worth, reinforce self-care, relapse prevention strategies, and trigger / craving management  Name: Xavier Ramsey Date of Birth: 1990/09/20  MR: [id number]    Level of Participation: active Quality of Participation: cooperative Interactions with others: gave feedback Mood/Affect: appropriate Triggers (if applicable): Patient reports his main trigger is being in an unhealthy relationship. Patient reports he and his partner are both excessive drinkers and reports he has realized that he no longer wanted to live his life this way. Patient reports he is seeking treatment at this time in order to produce a better outcome for his life.  Cognition: coherent/clear, goal directed, and insightful Progress: Gaining insight Response: Patient reports his main trigger is being in an unhealthy relationship. Patient reports he and his partner are both excessive drinkers and reports he has realized that he no longer wanted to live his life this way. Patient reports he is seeking treatment at this time in order to produce a better outcome for his life.  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

## 2023-01-17 NOTE — Tx Team (Signed)
LCSW and MD met with patient to assess current mood, affect, physical state, and inquire about needs/goals while here in [facility] and after discharge. Patient reports he presented due to needing help with detox and find residential placement. Patient reports he is court ordered to go to a residential placement at this time due to getting another DUI. Patient reports he had to go to jail for 3 days and reports it was the worse experience of his life, so it made him reconsider his actions and need for treatment. Patient reports he has court dates on May 17th and June 13th for two DUIs. Patient denies having any other pending legal charges or upcoming court dates. Patient reports he currently struggles with alcohol and reports drinking about a 5th of alcohol a day. Patient reports he has been drinking this amount since he was 33 years old. Patient reports a one and a half month period of sobriety and reports staying isolated and spending time with this mother and son helped him remain clean. Patient reports family is a motivating factor for change. Patient reports he also smokes marijuana and states he has used cocaine a few times in the past but it is not something he does often. Patient denies having any current withdrawals. Patient reports he lives at home with his mother and 25 year old son. Patient reports he splits custody with mother of child 50/50. Patient reports having a good deal of support from family including his grandparents. Patient at this time denies SI/HI/AVH. Patient denies ever seeking outpatient or inpatient substance use treatment. Patient reports he is court ordered to complete treatment at a 30 day facility at this time. Patient aware that LCSW will send referrals out for review and will follow up to provide updates as received. Patient expressed understanding and appreciation of LCSW assistance. No other needs were reported at this time by patient.   Patient has [place] Medicaid [employer] of  [place]. Referral has been sent to [facility] for review. LCSW will follow up to provide updates as received.   Fernande Boyden, LCSW Clinical Social Worker [facility] Ph: [phone]

## 2023-01-17 NOTE — ED Notes (Signed)
Patient remains asleep in bed without issue or complaint.  No distress.  Will monitor.

## 2023-01-17 NOTE — Group Note (Signed)
Group Topic: Social Support  Group Date: 01/17/2023 Start Time: 0800 End Time: 0830 Facilitators: Rae Lips B  Department: [facility]  Number of Participants: 7  Group Focus: acceptance Treatment Modality:  Individual Therapy Interventions utilized were leisure development Purpose: improve communication skills  Name: TANIELA KOLESNIK Date of Birth: Mar 10, 1991  MR: [id number]    Level of Participation: active Quality of Participation: attentive Interactions with others: gave feedback Mood/Affect: appropriate Triggers (if applicable): NA Cognition: insightful Progress: Gaining insight Response: NA Plan: patient will be encouraged to Keep going to groups.   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

## 2023-01-17 NOTE — Group Note (Signed)
Group Topic: Wellness  Group Date: 01/17/2023 Start Time: 1000 End Time: 1115 Facilitators: Cristal Ford  Department: [facility]  Number of Participants: 6  Group Focus: self-awareness Treatment Modality:  Psychoeducation Interventions utilized were patient education Purpose: reinforce self-care  Name: Xavier Ramsey Date of Birth: 01-Sep-1991  MR: [id number]    Level of Participation: active Quality of Participation: attentive and cooperative Interactions with others: gave feedback Mood/Affect: positive Triggers (if applicable): na Cognition: coherent/clear Progress: Moderate Response: na Plan: follow-up needed  Patients Problems:  Patient Active Problem List   Diagnosis Date Noted   Alcohol use disorder, severe, dependence (HCC) 01/17/2023   MDD (major depressive disorder), recurrent episode (HCC) 07/26/2022   Anxiety 07/26/2022   Postconcussive syndrome 07/26/2022   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

## 2023-01-17 NOTE — ED Provider Notes (Signed)
FBC Progress Note  Date and Time: 01/17/2023 8:28 AM Name: LYNDLE VIRGEN MRN:  [id number]  Reason For Admission: YER BERENDES is a 32 y.o. male with a history of MDD, and low thiamine who presented voluntarily admitted to [facility] for alcohol detox.   Subjective:   Patient seen and assessed at bedside.  Patient denies acute withdrawal symptoms at this time.  He reports feeling fatigued but otherwise has no acute complaints at this time.  He states he is sleeping well but has problems with appetite.  He is agreeable to starting Ensure to aid with nutritional supplementation.  He is also agreeable to restarting melatonin to aid with sleep.  He denies SI/HI/AVH.  He continues to be motivated to discontinue alcohol use and would like to go to residential rehab for treatment.   Diagnosis:  Final diagnoses:  Alcohol use disorder, severe, dependence (HCC)  Moderate episode of recurrent major depressive disorder (HCC)  Substance abuse (HCC)  Marijuana user    Total Time spent with patient: 30 minutes   Labs  Lab Results:     Latest Ref Rng & Units 01/17/2023   12:41 AM 04/06/2022    2:01 PM 06/04/2021   10:33 AM  CBC  WBC 4.0 - 10.5 K/uL 5.0  3.5  5.0   Hemoglobin 13.0 - 17.0 g/dL 78.2  95.6  21.3   Hematocrit 39.0 - 52.0 % 38.1  38.7  39.0   Platelets 150 - 400 K/uL 254  212  246       Latest Ref Rng & Units 01/17/2023   12:41 AM 04/06/2022    2:01 PM 06/04/2021   10:33 AM  CMP  Glucose 70 - 99 mg/dL 086  578  85   BUN 6 - 20 mg/dL 9  8  12    Creatinine 0.61 - 1.24 mg/dL 4.69  6.29  5.28   Sodium 135 - 145 mmol/L 135  142  144   Potassium 3.5 - 5.1 mmol/L 3.6  3.4  4.1   Chloride 98 - 111 mmol/L 97  100  106   CO2 22 - 32 mmol/L 24  26  21    Calcium 8.9 - 10.3 mg/dL 9.2  9.2  9.0   Total Protein 6.5 - 8.1 g/dL 7.4   6.1   Total Bilirubin 0.3 - 1.2 mg/dL 1.4   0.6   Alkaline Phos 38 - 126 U/L 88   81   AST 15 - 41 U/L 128   61   ALT 0 - 44 U/L 104   45     Physical Findings    GAD-7    Flowsheet Row Video Visit from 07/06/2021 in [facility] at [facility] Office Visit from 10/21/2020 in [facility] at [facility]  Total GAD-7 Score 16 0      PHQ2-9    Flowsheet Row ED from 01/16/2023 in [facility] Counselor from 07/26/2022 in [facility] Video Visit from 07/06/2021 in [facility] Primary Care at [facility] Office Visit from 01/12/2021 in [facility] Primary Care at [facility] Office Visit from 11/12/2020 in [facility]  PHQ-2 Total Score 3 5 2 2  0  PHQ-9 Total Score 14 20 14 9 4       Flowsheet Row ED from 01/16/2023 in [facility] ED from 10/04/2022 in [facility] Emergency Department at [facility] Counselor from 07/26/2022 in [place]  [facility]  C-SSRS RISK CATEGORY No Risk No Risk Low Risk        Musculoskeletal  Strength & Muscle Tone: within normal limits Gait & Station: normal Patient leans: N/A  Psychiatric Specialty Exam  Presentation  General Appearance:  Appropriate for Environment   Eye Contact: Good   Speech: Clear and Coherent   Speech Volume: Normal   Handedness: Right    Mood and Affect  Mood: Euthymic   Affect: Congruent    Thought Process  Thought Processes: Coherent   Descriptions of Associations:Intact   Orientation:Full (Time, Place and Person)   Thought Content:WDL   Diagnosis of Schizophrenia or Schizoaffective disorder in past: No     Hallucinations:Hallucinations: None   Ideas of Reference:None   Suicidal Thoughts:Suicidal Thoughts: No   Homicidal Thoughts:Homicidal Thoughts: No    Sensorium  Memory: Immediate Good   Judgment: Poor   Insight: Fair    Art therapist  Concentration: Good   Attention Span: Good   Recall: Good   Fund of  Knowledge: Good   Language: Good    Psychomotor Activity  Psychomotor Activity: Psychomotor Activity: Normal    Assets  Assets: Communication Skills; Desire for Improvement; Resilience; Social Support    Sleep  Sleep: Sleep: Poor    Physical Exam  Physical Exam ROS Blood pressure 111/80, pulse 89, temperature (!) 97.3 F (36.3 C), temperature source Oral, resp. rate 19, SpO2 100 %. There is no height or weight on file to calculate BMI.  ASSESSMENT TAVIEN RICARDEZ is a 32 y.o. male with a history of MDD and low thiamine who presented voluntarily admitted to [facility] for alcohol detox.   Continues to be motivated to cease alcohol use.  Currently not experiencing any specific withdrawal symptoms.  Labs show mild increase in AST and ALT likely alcoholic hepatitis. CBC relatively benign. Will order acute hepatitis panel and repeat hepatic function panel for tomorrow. Will monitor for disorientation or paresthesia given hx of severe thiamine deficiency.   PLAN Alcohol Use Disorder Malnutrition -CIWA with Ativan as needed (11>0). Received 1 dose of ativan on arrival despite no documented signs of anxiety or agitation. -MVI/thiamine -Discontinued scheduled Ativan taper -Restart home melatonin 3 mg nightly -Ensure 2 times daily between meals  Dispo TBD. Appreciate LCSW's assistance   Park Pope, MD 01/17/2023 8:28 AM

## 2023-01-17 NOTE — ED Notes (Signed)
Received patient this PM. Patient in his bed resting/sleeping. Patient respirations are even and unlabored. Will continue to monitor for safety.

## 2023-01-18 DIAGNOSIS — Z9151 Personal history of suicidal behavior: Secondary | ICD-10-CM | POA: Diagnosis not present

## 2023-01-18 DIAGNOSIS — F419 Anxiety disorder, unspecified: Secondary | ICD-10-CM | POA: Diagnosis not present

## 2023-01-18 DIAGNOSIS — F1020 Alcohol dependence, uncomplicated: Secondary | ICD-10-CM | POA: Diagnosis not present

## 2023-01-18 DIAGNOSIS — F331 Major depressive disorder, recurrent, moderate: Secondary | ICD-10-CM | POA: Diagnosis not present

## 2023-01-18 MED ORDER — GABAPENTIN 100 MG PO CAPS
100.0000 mg | ORAL_CAPSULE | Freq: Two times a day (BID) | ORAL | Status: DC
  Administered 2023-01-18 (×2): 100 mg via ORAL
  Filled 2023-01-18 (×2): qty 1

## 2023-01-18 NOTE — ED Notes (Signed)
Patient denies SI,HI,AVH. Medication administered without difficulty to patient. Patient is cooperative and interacts well with staff. Respiratory is even and unlabored. No distress noted. Patient sleeping or resting in bed at present. Patient stated no complaints at present. Will continue to monitor for safety.

## 2023-01-18 NOTE — Group Note (Signed)
Group Topic: Communication  Group Date: 01/18/2023 Start Time: 1200 End Time: 1230 Facilitators: Merrie Roof, RN  Department: [facility]  Number of Participants: 6  Group Focus: safety plan Treatment Modality:  Patient-Centered Therapy Interventions utilized were assignment Purpose: express feelings  Name: Xavier Ramsey Date of Birth: [birth year]  MR: [id number]    Level of Participation: active Quality of Participation: cooperative Interactions with others: gave feedback Mood/Affect: appropriate Triggers (if applicable):  Cognition: coherent/clear Progress: Significant Response:  Plan: patient will be encouraged to continue with therapy  Patients Problems:  Patient Active Problem List   Diagnosis Date Noted   Alcohol use disorder, severe, dependence (HCC) 01/17/2023   MDD (major depressive disorder), recurrent episode (HCC) 07/26/2022   Anxiety 07/26/2022   Postconcussive syndrome 07/26/2022   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

## 2023-01-18 NOTE — ED Notes (Addendum)
Patient in milieu. Environment is secured. Will continue to monitor for safety. 

## 2023-01-18 NOTE — ED Notes (Signed)
Patient in milieu. Environment is secured. Will continue to monitor for safety. 

## 2023-01-18 NOTE — ED Notes (Signed)
Patient alert and oriented x 3. Denies SI/HI/AVH. Denies intent or plan to harm self or others. Routine conducted according to faculty protocol. Encourage patient to notify staff with any needs or concerns. Patient verbalized agreement and understanding. Will continue to monitor for safety. 

## 2023-01-18 NOTE — ED Notes (Signed)
Pt sleeping@this time. Breathing even and unlabored. Will continue to monitor for safety 

## 2023-01-18 NOTE — Group Note (Signed)
Group Topic: Healthy Self Image and Positive Change  Group Date: 01/18/2023 Start Time: 1007 End Time: 1045 Facilitators: Vonzell Schlatter B  Department: [facility]  Number of Participants: 4  Group Focus: goals/reality orientation Treatment Modality:  Psychoeducation Interventions utilized were group exercise Purpose: express feelings and regain self-worth  Name: CHESTERFIELD MERCADEL Date of Birth: [birth year]  MR: [id number]    Level of Participation: minimal Quality of Participation: attentive and cooperative Interactions with others: gave feedback Mood/Affect: positive Triggers (if applicable): na Cognition: coherent/clear Progress: Moderate Response: na Plan: follow-up needed  Patients Problems:  Patient Active Problem List   Diagnosis Date Noted   Alcohol use disorder, severe, dependence (HCC) 01/17/2023   MDD (major depressive disorder), recurrent episode (HCC) 07/26/2022   Anxiety 07/26/2022   Postconcussive syndrome 07/26/2022   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

## 2023-01-18 NOTE — ED Notes (Signed)
Mht stuck patient x1 and rn stuck patient x2 unsuccessful. Rn notified provider. Will pass on to next shift.

## 2023-01-18 NOTE — ED Provider Notes (Cosign Needed Addendum)
FBC Progress Note  Date and Time: 01/18/2023 9:24 AM Name: SUREN PICKERT MRN:  [id number]  Reason For Admission: DEMARYIUS MEININGER is a 32 y.o. male with a history of MDD, and low thiamine who presented voluntarily admitted to [facility] for alcohol detox.   Subjective:   Patient seen and assessed at bedside.  Patient denies acute withdrawal symptoms at this time.  He reports feeling fatigued but otherwise has no acute complaints at this time.  He states he is sleeping well but has problems with appetite.  Reports melatonin was helpful for sleep. He denies SI/HI/AVH.  He continues to be motivated to discontinue alcohol use given his court ordered detox and 30 day rehab. He denies feelings of depression and anxiety. He endorses bilateral paresthesia that has been ongoing for some time. I discussed initiation of gabapentin and he was agreeable.   Diagnosis:  Final diagnoses:  Alcohol use disorder, severe, dependence (HCC)  Moderate episode of recurrent major depressive disorder (HCC)  Substance abuse (HCC)  Marijuana user    Total Time spent with patient: 30 minutes   Labs  Lab Results:     Latest Ref Rng & Units 01/17/2023   12:41 AM 04/06/2022    2:01 PM 06/04/2021   10:33 AM  CBC  WBC 4.0 - 10.5 K/uL 5.0  3.5  5.0   Hemoglobin 13.0 - 17.0 g/dL 40.9  81.1  91.4   Hematocrit 39.0 - 52.0 % 38.1  38.7  39.0   Platelets 150 - 400 K/uL 254  212  246       Latest Ref Rng & Units 01/17/2023   12:41 AM 04/06/2022    2:01 PM 06/04/2021   10:33 AM  CMP  Glucose 70 - 99 mg/dL 782  956  85   BUN 6 - 20 mg/dL 9  8  12    Creatinine 0.61 - 1.24 mg/dL 2.13  0.86  5.78   Sodium 135 - 145 mmol/L 135  142  144   Potassium 3.5 - 5.1 mmol/L 3.6  3.4  4.1   Chloride 98 - 111 mmol/L 97  100  106   CO2 22 - 32 mmol/L 24  26  21    Calcium 8.9 - 10.3 mg/dL 9.2  9.2  9.0   Total Protein 6.5 - 8.1 g/dL 7.4   6.1   Total Bilirubin 0.3 - 1.2 mg/dL 1.4   0.6   Alkaline Phos 38 - 126 U/L 88   81   AST 15 - 41  U/L 128   61   ALT 0 - 44 U/L 104   45     Physical Findings   GAD-7    Flowsheet Row Video Visit from 07/06/2021 in [facility] Primary Care at [facility] Office Visit from 10/21/2020 in [facility] at [facility]  Total GAD-7 Score 16 0      PHQ2-9    Flowsheet Row ED from 01/16/2023 in [facility] Counselor from 07/26/2022 in [facility] Video Visit from 07/06/2021 in [facility] Primary Care at [facility] Office Visit from 01/12/2021 in [facility] Primary Care at [facility] Office Visit from 11/12/2020 in [facility]  PHQ-2 Total Score 3 5 2 2  0  PHQ-9 Total Score 14 20 14 9 4       Flowsheet Row ED from 01/16/2023 in [facility] ED from 10/04/2022 in [facility] Emergency Department at  Physiological scientist from 07/26/2022 in [facility]  C-SSRS RISK CATEGORY No Risk No Risk Low Risk        Musculoskeletal  Strength & Muscle Tone: within normal limits Gait & Station: normal Patient leans: N/A  Psychiatric Specialty Exam  Presentation  General Appearance:  Appropriate for Environment   Eye Contact: Good   Speech: Clear and Coherent   Speech Volume: Normal   Handedness: Right    Mood and Affect  Mood: Euthymic   Affect: Congruent    Thought Process  Thought Processes: Coherent   Descriptions of Associations:Intact   Orientation:Full (Time, Place and Person)   Thought Content:WDL   Diagnosis of Schizophrenia or Schizoaffective disorder in past: No     Hallucinations:Hallucinations: None   Ideas of Reference:None   Suicidal Thoughts:Suicidal Thoughts: No   Homicidal Thoughts:Homicidal Thoughts: No    Sensorium  Memory: Immediate Good   Judgment: Poor   Insight: Fair    Art therapist  Concentration: Good   Attention  Span: Good   Recall: Good   Fund of Knowledge: Good   Language: Good    Psychomotor Activity  Psychomotor Activity: Psychomotor Activity: Normal    Assets  Assets: Communication Skills; Desire for Improvement; Resilience; Social Support    Sleep  Sleep: Sleep: Poor    Physical Exam  Physical Exam ROS Blood pressure 120/83, pulse 62, temperature 97.7 F (36.5 C), temperature source Oral, resp. rate 16, SpO2 99 %. There is no height or weight on file to calculate BMI.  ASSESSMENT NYCHOLAS PEOT is a 32 y.o. male with a history of MDD and low thiamine who presented voluntarily admitted to [facility] for alcohol detox.   Continues to be motivated to cease alcohol use.  Currently not experiencing any specific withdrawal symptoms.    PLAN Alcohol Use Disorder Malnutrition -CIWA with Ativan as needed (11>0). Received 1 dose of ativan on arrival despite no documented signs of anxiety or agitation. -MVI/thiamine -Discontinued scheduled Ativan taper -Restart home melatonin 3 mg nightly -Ensure 2 times daily between meals -continue trazodone 100 mg qhs prn for insomnia -continue hydroxyzine 25 mg tid prn for anxiety -start gabapentin 100 mg bid for paresthesia likely secondary to alcohol use  Dispo TBD. Appreciate LCSW's assistance   Park Pope, MD 01/18/2023 9:24 AM

## 2023-01-19 DIAGNOSIS — F419 Anxiety disorder, unspecified: Secondary | ICD-10-CM | POA: Diagnosis not present

## 2023-01-19 DIAGNOSIS — F1020 Alcohol dependence, uncomplicated: Secondary | ICD-10-CM | POA: Diagnosis not present

## 2023-01-19 DIAGNOSIS — F331 Major depressive disorder, recurrent, moderate: Secondary | ICD-10-CM | POA: Diagnosis not present

## 2023-01-19 DIAGNOSIS — Z9151 Personal history of suicidal behavior: Secondary | ICD-10-CM | POA: Diagnosis not present

## 2023-01-19 LAB — HEPATIC FUNCTION PANEL
ALT: 91 U/L — ABNORMAL HIGH (ref 0–44)
AST: 80 U/L — ABNORMAL HIGH (ref 15–41)
Albumin: 4.3 g/dL (ref 3.5–5.0)
Alkaline Phosphatase: 79 U/L (ref 38–126)
Bilirubin, Direct: <0.1 mg/dL (ref 0.0–0.2)
Total Bilirubin: 0.5 mg/dL (ref 0.3–1.2)
Total Protein: 7.4 g/dL (ref 6.5–8.1)

## 2023-01-19 LAB — HEPATITIS PANEL, ACUTE
HCV Ab: NONREACTIVE
Hep A IgM: NONREACTIVE
Hep B C IgM: NONREACTIVE
Hepatitis B Surface Ag: NONREACTIVE

## 2023-01-19 MED ORDER — MELATONIN 5 MG PO TABS
5.0000 mg | ORAL_TABLET | Freq: Every day | ORAL | Status: DC
  Administered 2023-01-19 – 2023-01-22 (×4): 5 mg via ORAL
  Filled 2023-01-19 (×4): qty 1
  Filled 2023-01-19: qty 14

## 2023-01-19 MED ORDER — GABAPENTIN 100 MG PO CAPS
200.0000 mg | ORAL_CAPSULE | Freq: Two times a day (BID) | ORAL | Status: DC
  Administered 2023-01-19 – 2023-01-23 (×9): 200 mg via ORAL
  Filled 2023-01-19: qty 56
  Filled 2023-01-19 (×9): qty 2

## 2023-01-19 NOTE — Discharge Planning (Signed)
Patient has [place] Medicaid [employer] [place]. Referral was sent to [facility] for review on 05/07. LCSW contacted Admissions Coordinator Chanetta Marshall this morning regarding and was informed that referral is still under review. Anticipate update on today. LCSW will continue to follow and provide updates as received.    Fernande Boyden, LCSW Clinical Social Worker [facility] Ph: [phone]

## 2023-01-19 NOTE — Progress Notes (Signed)
Progress note   D: Pt seen at med window. Pt denies SI, HI, AVH. Pt rates pain  0/10. Pt rates anxiety  4/10 and some depression. Pt's son had a recital today that pt was not able to attend d/t being in hospital. Pt pleasant. Endorses good appetite. Issues with getting a good night's sleep. Attended group this evening. No other concerns noted at this time.  A: Pt provided support and encouragement. Pt given scheduled medication as prescribed. PRNs as appropriate. Q15 min checks for safety.   R: Pt safe on the unit. Will continue to monitor.

## 2023-01-19 NOTE — ED Notes (Signed)
Pt sleeping@this time. Breathing even and unlabored. Will continue to monitor for safety 

## 2023-01-19 NOTE — ED Notes (Signed)
Pt in dayroom at this hour. No apparent distress. RR even and unlabored. Monitored for safety.  

## 2023-01-19 NOTE — ED Notes (Signed)
Patient denies SI/HI and AVH. Patient has been calm and cooperative with staff and other patients. Patient ate breakfast in the dayroom with other patients. Patient is being monitored for safety.

## 2023-01-19 NOTE — ED Notes (Signed)
Pt asleep at this hour. No apparent distress. RR even and unlabored. Monitored for safety.  

## 2023-01-19 NOTE — Group Note (Signed)
Group Topic: Relapse and Recovery  Group Date: 01/19/2023 Start Time: 0800 End Time: 0959 Facilitators: Emmit Pomfret D, NT  Department: [facility]  Number of Participants: 4  Group Focus: community group, problem solving, relapse prevention, safety plan, and substance abuse education Treatment Modality:  Psychoeducation Interventions utilized were other AA Meeting, problem solving, and support Purpose: enhance coping skills, increase insight, and relapse prevention strategies  Name: Xavier Ramsey Date of Birth: [birth year]  MR: [id number]    Level of Participation: moderate Quality of Participation: attentive Interactions with others: gave feedback Mood/Affect: appropriate Triggers (if applicable): n/a Cognition: coherent/clear Progress: Significant Response: n/a Plan: follow-up needed  Patients Problems:  Patient Active Problem List   Diagnosis Date Noted   Alcohol use disorder, severe, dependence (HCC) 01/17/2023   MDD (major depressive disorder), recurrent episode (HCC) 07/26/2022   Anxiety 07/26/2022   Postconcussive syndrome 07/26/2022   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

## 2023-01-19 NOTE — Group Note (Signed)
Group Topic: Relapse and Recovery  Group Date: 01/19/2023 Start Time: 0800 End Time: 0900 Facilitators: Rae Lips B  Department: [facility]  Number of Participants: 3  Group Focus: chemical dependency issues Treatment Modality:  Leisure Development Interventions utilized were leisure development Purpose: relapse prevention strategies  Name: MAGNUS AURICH Date of Birth: October 04, 1990  MR: [id number]    Level of Participation: active Quality of Participation: attentive Interactions with others: gave feedback Mood/Affect: appropriate Triggers (if applicable): NA Cognition: insightful Progress: Gaining insight Response: NA Plan: patient will be encouraged to keep going to groups  Patients Problems:  Patient Active Problem List   Diagnosis Date Noted   Alcohol use disorder, severe, dependence (HCC) 01/17/2023   MDD (major depressive disorder), recurrent episode (HCC) 07/26/2022   Anxiety 07/26/2022   Postconcussive syndrome 07/26/2022   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

## 2023-01-19 NOTE — ED Provider Notes (Signed)
FBC Progress Note  Date and Time: 01/19/2023 1:42 PM Name: Xavier Ramsey MRN:  [id number]  Reason For Admission: [name] is a 32 y.o. male with a history of MDD, and low thiamine who presented voluntarily admitted to [facility] for alcohol detox.   Subjective:   Patient seen and assessed at bedside.  Patient denies acute withdrawal symptoms at this time.  Reports sleep has improved since trazodone but has continued to be not great so would like to go up on trazodone.  Reports melatonin was helpful for sleep so will go up on this as well. He denies SI/HI/AVH.  He continues to be motivated to discontinue alcohol use given his court ordered detox and 30 day rehab. He denies feelings of depression and anxiety. He endorses bilateral paresthesia that has been ongoing but improved with current gabapentin so we will go up on it.  Diagnosis:  Final diagnoses:  Alcohol use disorder, severe, dependence (HCC)  Moderate episode of recurrent major depressive disorder (HCC)  Substance abuse (HCC)  Marijuana user    Total Time spent with patient: 30 minutes   Labs  Lab Results:     Latest Ref Rng & Units 01/17/2023   12:41 AM 04/06/2022    2:01 PM 06/04/2021   10:33 AM  CBC  WBC 4.0 - 10.5 K/uL 5.0  3.5  5.0   Hemoglobin 13.0 - 17.0 g/dL 86.5  78.4  69.6   Hematocrit 39.0 - 52.0 % 38.1  38.7  39.0   Platelets 150 - 400 K/uL 254  212  246       Latest Ref Rng & Units 01/17/2023   12:41 AM 04/06/2022    2:01 PM 06/04/2021   10:33 AM  CMP  Glucose 70 - 99 mg/dL 295  284  85   BUN 6 - 20 mg/dL 9  8  12    Creatinine 0.61 - 1.24 mg/dL 1.32  4.40  1.02   Sodium 135 - 145 mmol/L 135  142  144   Potassium 3.5 - 5.1 mmol/L 3.6  3.4  4.1   Chloride 98 - 111 mmol/L 97  100  106   CO2 22 - 32 mmol/L 24  26  21    Calcium 8.9 - 10.3 mg/dL 9.2  9.2  9.0   Total Protein 6.5 - 8.1 g/dL 7.4   6.1   Total Bilirubin 0.3 - 1.2 mg/dL 1.4   0.6   Alkaline Phos 38 - 126 U/L 88   81   AST 15 - 41 U/L 128   61    ALT 0 - 44 U/L 104   45     Physical Findings   GAD-7    Flowsheet Row Video Visit from 07/06/2021 in [facility] at [facility] Office Visit from 10/21/2020 in [facility] at [facility]  Total GAD-7 Score 16 0      PHQ2-9    Flowsheet Row ED from 01/16/2023 in [facility] Counselor from 07/26/2022 in [facility] Video Visit from 07/06/2021 in [facility] Primary Care at [facility] Office Visit from 01/12/2021 in [facility] Primary Care at [facility] Office Visit from 11/12/2020 in [facility]  PHQ-2 Total Score 3 5 2 2  0  PHQ-9 Total Score 14 20 14 9 4       Flowsheet Row ED from 01/16/2023 in [facility] ED from 10/04/2022 in [facility] at [facility] Counselor from 07/26/2022 in [facility]  C-SSRS RISK CATEGORY No Risk No Risk Low Risk        Musculoskeletal  Strength & Muscle Tone: within normal limits Gait & Station: normal Patient leans: N/A  Psychiatric Specialty Exam  Presentation  General Appearance:  Appropriate for Environment; Casual   Eye Contact: Good   Speech: Clear and Coherent; Normal Rate   Speech Volume: Normal   Handedness: Right    Mood and Affect  Mood: Euthymic   Affect: Appropriate; Congruent    Thought Process  Thought Processes: Coherent; Goal Directed; Linear   Descriptions of Associations:Intact   Orientation:Full (Time, Place and Person)   Thought Content:Logical   Diagnosis of Schizophrenia or Schizoaffective disorder in past: No     Hallucinations:Hallucinations: None    Ideas of Reference:None   Suicidal Thoughts:Suicidal Thoughts: No    Homicidal Thoughts:Homicidal Thoughts: No     Sensorium  Memory: Remote Good   Judgment: Good   Insight: Good    Executive Functions   Concentration: Good   Attention Span: Good   Recall: Good   Fund of Knowledge: Good   Language: Good    Psychomotor Activity  Psychomotor Activity: Psychomotor Activity: Normal     Assets  Assets: Communication Skills; Resilience; Desire for Improvement    Sleep  Sleep: Sleep: Good     Physical Exam  Physical Exam Vitals and nursing note reviewed.  Constitutional:      General: He is not in acute distress.    Appearance: He is well-developed.  HENT:     Head: Normocephalic and atraumatic.  Eyes:     Conjunctiva/sclera: Conjunctivae normal.  Cardiovascular:     Rate and Rhythm: Normal rate and regular rhythm.  Pulmonary:     Effort: Pulmonary effort is normal. No respiratory distress.     Breath sounds: Normal breath sounds.  Abdominal:     Palpations: Abdomen is soft.     Tenderness: There is no abdominal tenderness.  Musculoskeletal:        General: No swelling.     Cervical back: Neck supple.  Skin:    General: Skin is warm and dry.     Capillary Refill: Capillary refill takes less than 2 seconds.  Neurological:     Mental Status: He is alert.  Psychiatric:        Mood and Affect: Mood normal.    Review of Systems  Respiratory:  Negative for shortness of breath.   Cardiovascular:  Negative for chest pain.  Gastrointestinal:  Negative for abdominal pain, constipation, diarrhea, heartburn, nausea and vomiting.  Neurological:  Negative for headaches.   Blood pressure 116/82, pulse 63, temperature 97.9 F (36.6 C), temperature source Oral, resp. rate 17, SpO2 100 %. There is no height or weight on file to calculate BMI.  ASSESSMENT Xavier Ramsey is a 32 y.o. male with a history of MDD and low thiamine who presented voluntarily admitted to [facility] for alcohol detox.   Continues to be stable. Seeking court ordered 28 day residential rehab program.  PLAN Alcohol Use Disorder Malnutrition -CIWA with Ativan as needed (11>0). Received 1  dose of ativan on arrival despite no documented signs of anxiety or agitation. -MVI/thiamine -Discontinued scheduled Ativan taper -Restart home melatonin 3 mg nightly -Ensure 2 times daily between meals -continue trazodone 100 mg qhs prn for insomnia -continue hydroxyzine 25 mg tid prn for anxiety -increase gabapentin to 200 mg bid  for paresthesia likely secondary to alcohol use  Dispo TBD. Awaiting [facility]   Park Pope, MD 01/19/2023 1:42 PM

## 2023-01-19 NOTE — Discharge Planning (Signed)
Referral was received and per Chanetta Marshall, patient has been accepted and can transfer to the facility on Monday 01/23/23 by 9:00am. Update has been provided to the patient and MD made aware. Patient will need a 14-30 day supply of medication and one month refill. No nicotine gum allowed, however 14-30 day nicotine patches to be provided if needed.  Transportation will be arranged via taxi by [facility]. No other needs to report at this time.    LCSW will continue to follow up and provide updates as received.    Fernande Boyden, LCSW Clinical Social Worker [place] [facility] Ph: [phone]

## 2023-01-19 NOTE — Discharge Instructions (Addendum)
Patient will discharge to [facility]: [address] [place], [place] [address] on 01/23/2023 by 9:00am with transportation provided by [facility].   [facility] [address], [place], [address] [phone] phone  New Patient Assessment/Therapy Walk-Ins:  Monday and Wednesday: 8 am until slots are full. Every 1st and 2nd Fridays of the month: 1 pm - 5 pm.  NO ASSESSMENT/THERAPY WALK-INS ON TUESDAYS OR THURSDAYS  New Patient Assessment/Medication Management Walk-Ins:  Monday - Friday:  8 am - 11 am.  For all walk-ins, we ask that you arrive by 7:30 am because patients will be seen in the order of arrival.  Availability is limited; therefore, you may not be seen on the same day that you walk-in.  Our goal is to serve and meet the needs of our community to the best of our ability.  SUBSTANCE USE TREATMENT for Medicaid and State Funded/IPRS  [facility] [address], [place], [address] [phone] phone NOTE: [facility] is no longer offering IOP services.  Serves those who are low-income or have no insurance.  [facility] [address] [place], [place], [address] [phone] phone [phone] fax NOTE: Does have Substance Abuse-Intensive Outpatient Program [facility]) as well as transitional housing if eligible.  [facility] [address] [place], [place], [address] [phone] phone [phone] fax  [facility] [phone] [place], [place], [address] [phone] phone [phone] fax  HALFWAY HOUSES:  [facility] [phone]  [employer].oxfordvacancies.[email or web address]  12 STEP PROGRAMS:  Alcoholics Anonymous of [employer]  Narcotics Anonymous of  [employer]  Al-Anon of [place], [place] www.[place]alanon.org/find-meetings.html  Nar-Anon  https://nar-anon.org/find-a-meetin  List of Residential placements:   [facility] in [place]: [phone]  [facility]: [phone]  [place], [place] [phone]: Male and male facility; 30-day program: (uninsured and Medicaid such as Laurena Bering, [place], [place], partners)  [facility]: [phone]; men and women's facility; 28 days; Can have Medicaid tailored plan Tour manager or Partners)  [facility]: [phone] Karoline Caldwell or Larita Fife; 28 day program; must be fully detox; tailored Medicaid or no insurance  [address] in [place], [place]; [phone]; 28 day all males program; no insurance accepted  [facility] Referral in [place]: Gabriel Rung [phone] (no insurance or Medicaid only); 90 days; outpatient services but provide housing in apartments downtown [place]  [facility] Admission: [phone]: Patient must complete phone screening for placement: [place], [place]; 6 month program; uninsured, Medicaid, and [employer].   [facility]: no insurance required; [phone]  [facility]: [phone]; Intake: Molly Maduro; Must fill out application online; Alecia Lemming Delay [phone] x [address] [facility] in [place], [place]: [phone]; Admissions Coordinators Mr. Maurine Minister or Barron Alvine; 90 day program.  [facility]: [place], [place] [phone]; Co-Ed 9 month to a year program; Online application; Men entry fee is $500 (6-539months);  [employer]: [address] [place], [place] [address]; no fee or insurance required; minimum of 2 years; Highly structured; work based; Intake Coordinator is Thayer Ohm [phone]  [facility] in [place], [place]: [phone]; Fax number is [phone]; website: www.[facility].org; Requires 3-6 page autobiography; 2 year program (18 months and then 39month transitional housing); Admission fee is $300; no insurance needed; work  Automotive engineer in [place], [place]: [employer] Desk Staff: Danise Edge [phone]: They have a Men's Regenerations Program 6-27months. Free program; There is an initial $300 fee however, they are willing to work with  patients regarding that. Application is online.  [facility]: Admissions [phone] Doran Heater ext [phone]; Any 7-90 day program is out of pocket; 12 month program is free of charge; there is a $275 entry fee; Patient is responsible for own transportation

## 2023-01-19 NOTE — Group Note (Signed)
Group Topic: Decisional Balance/Substance Abuse  Group Date: 01/19/2023 Start Time: 0130 End Time: 0230 Facilitators: Lenny Pastel  Department: [facility]  Number of Participants: 3  Group Focus: acceptance, activities of daily living skills, chemical dependency issues, communication, coping skills, family, feeling awareness/expression, goals/reality orientation, personal responsibility, problem solving, relapse prevention, safety plan, self-awareness, and self-esteem Treatment Modality:  Behavior Modification Therapy and Spiritual Interventions utilized were clarification, exploration, story telling, and support Purpose: enhance coping skills, explore maladaptive thinking, express feelings, improve communication skills, increase insight, regain self-worth, reinforce self-care, relapse prevention strategies, and trigger / craving management  Name: Xavier Ramsey Date of Birth: 1990-09-16  MR: [id number]    Level of Participation: active Quality of Participation: cooperative Interactions with others: gave feedback Mood/Affect: appropriate Triggers (if applicable): Being around the same crowd Cognition: coherent/clear, goal directed, insightful, and logical Progress: Gaining insight Response: Patient reports he enjoyed listening to the inspirational video on today. Patient reports it challenges him to be more humble as well as considerate of the decisions he makes and how it effects himself and others. Patient reports he has to re-evaluate his circle and become more intentional about how he spends his free time. Patient reports he battled the possibility of discharging home and then reporting to [facility] on Monday, however reports he did not want to set himself back. LCSW provided brief supportive counseling to the patient and he was receptive to the feedback provided. Patient also provided positive feedback to his peers.  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

## 2023-01-19 NOTE — Group Note (Signed)
Group Topic: Identity and Relationships  Group Date: 01/19/2023 Start Time: 1010 End Time: 1055 Facilitators: Vonzell Schlatter B  Department: [facility]  Number of Participants: 5  Group Focus: daily focus Treatment Modality:  Psychoeducation Interventions utilized were reality testing Purpose: regain self-worth  Name: ANGELIS TOMICH Date of Birth: Nov 15, 1990  MR: [id number]    Level of Participation: active Quality of Participation: attentive and cooperative Interactions with others: gave feedback Mood/Affect: positive Triggers (if applicable): na Cognition: coherent/clear Progress: Moderate Response: na Plan: follow-up needed  Patients Problems:  Patient Active Problem List   Diagnosis Date Noted   Alcohol use disorder, severe, dependence (HCC) 01/17/2023   MDD (major depressive disorder), recurrent episode (HCC) 07/26/2022   Anxiety 07/26/2022   Postconcussive syndrome 07/26/2022   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

## 2023-01-20 DIAGNOSIS — F419 Anxiety disorder, unspecified: Secondary | ICD-10-CM | POA: Diagnosis not present

## 2023-01-20 DIAGNOSIS — F331 Major depressive disorder, recurrent, moderate: Secondary | ICD-10-CM | POA: Diagnosis not present

## 2023-01-20 DIAGNOSIS — F1020 Alcohol dependence, uncomplicated: Secondary | ICD-10-CM | POA: Diagnosis not present

## 2023-01-20 DIAGNOSIS — Z9151 Personal history of suicidal behavior: Secondary | ICD-10-CM | POA: Diagnosis not present

## 2023-01-20 MED ORDER — MELATONIN 5 MG PO TABS: 5.0000 mg | ORAL_TABLET | Freq: Every day | ORAL | 0 refills | Status: DC

## 2023-01-20 MED ORDER — TRAZODONE HCL 100 MG PO TABS: 100.0000 mg | ORAL_TABLET | Freq: Every evening | ORAL | 0 refills | Status: DC | PRN

## 2023-01-20 MED ORDER — GABAPENTIN 100 MG PO CAPS: 200.0000 mg | ORAL_CAPSULE | Freq: Two times a day (BID) | ORAL | 0 refills | Status: DC

## 2023-01-20 NOTE — ED Notes (Signed)
Pt is in the dayroom watching TV.  Respirations are even and unlabored. No acute distress noted. Will continue to monitor for safety. 

## 2023-01-20 NOTE — ED Notes (Signed)
Pt is up reading a book in his room.

## 2023-01-20 NOTE — ED Notes (Signed)
Received patient this AM. Patient in day room having breakfast. Patient respirations are even and unlabored. Will continue to monitor for safety. 

## 2023-01-20 NOTE — ED Notes (Addendum)
Patient denies SI,HI,AVH. Morning medication administered without difficulty to patient. Patient getting along well with peers. Patient is cooperative and interacts well with staff. Patients vital signs taken and CIWA score of 1 . Respiratory is even and unlabored. No distress noted. Patient resting in room at present. Patient stated no complaints at present. Will continue to monitor for safety.

## 2023-01-20 NOTE — Group Note (Signed)
Group Topic: Balance in Life  Group Date: 01/20/2023 Start Time: 0130 End Time: 0215 Facilitators: Lenny Pastel  Department: [facility]  Number of Participants: 4  Group Focus: activities of daily living skills, chemical dependency issues, clarity of thought, communication, coping skills, daily focus, feeling awareness/expression, healthy friendships, impulsivity, leisure skills, personal responsibility, problem solving, relapse prevention, self-awareness, self-esteem, social skills, and substance abuse education Treatment Modality:  Cognitive Behavioral Therapy Interventions utilized were assignment, exploration, group exercise, mental fitness, problem solving, and support Purpose: enhance coping skills, explore maladaptive thinking, express feelings, express irrational fears, improve communication skills, increase insight, regain self-worth, reinforce self-care, relapse prevention strategies, and trigger / craving management  Name: Xavier Ramsey Date of Birth: [birth year]  MR: [id number]    Level of Participation: active Quality of Participation: cooperative Interactions with others: gave feedback Mood/Affect: appropriate Triggers (if applicable): N/A Cognition: coherent/clear Progress: Gaining insight Response: Patient actively participated in group on today. Group started off with introductions and group rules. Group members participated in a therapeutic activity that required active listening and communication skills. Group members were able to identify similarities and differences within the group. Patient interacted positively with staff and peers. No issues to report.  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

## 2023-01-20 NOTE — ED Provider Notes (Addendum)
FBC Progress Note  Date and Time: 01/20/2023 7:47 AM Name: BLANCHE LUCZAK MRN:  [id number]  Reason For Admission: CAMBELL YEAKEY is a 32 y.o. male with a history of MDD, and low thiamine who presented voluntarily admitted to [facility] for alcohol detox.   Subjective:   Patient seen and assessed at bedside.  Continues to be stable. Discharge planned for 01/23/23 to [facility] for residential rehab. Patient feels ready for that. He states he is sleeping and eating much better than before. Reports paresthesia has largely improved so we will continue current medication regiment.  Diagnosis:  Final diagnoses:  Alcohol use disorder, severe, dependence (HCC)  Moderate episode of recurrent major depressive disorder (HCC)  Substance abuse (HCC)  Marijuana user    Total Time spent with patient: 30 minutes   Labs  Lab Results:     Latest Ref Rng & Units 01/17/2023   12:41 AM 04/06/2022    2:01 PM 06/04/2021   10:33 AM  CBC  WBC 4.0 - 10.5 K/uL 5.0  3.5  5.0   Hemoglobin 13.0 - 17.0 g/dL 78.2  95.6  21.3   Hematocrit 39.0 - 52.0 % 38.1  38.7  39.0   Platelets 150 - 400 K/uL 254  212  246       Latest Ref Rng & Units 01/19/2023    5:26 PM 01/17/2023   12:41 AM 04/06/2022    2:01 PM  CMP  Glucose 70 - 99 mg/dL  086  578   BUN 6 - 20 mg/dL  9  8   Creatinine 4.69 - 1.24 mg/dL  6.29  5.28   Sodium 413 - 145 mmol/L  135  142   Potassium 3.5 - 5.1 mmol/L  3.6  3.4   Chloride 98 - 111 mmol/L  97  100   CO2 22 - 32 mmol/L  24  26   Calcium 8.9 - 10.3 mg/dL  9.2  9.2   Total Protein 6.5 - 8.1 g/dL 7.4  7.4    Total Bilirubin 0.3 - 1.2 mg/dL 0.5  1.4    Alkaline Phos 38 - 126 U/L 79  88    AST 15 - 41 U/L 80  128    ALT 0 - 44 U/L 91  104      Physical Findings   GAD-7    Flowsheet Row Video Visit from 07/06/2021 in [facility] at [facility] Office Visit from 10/21/2020 in [facility] at [facility]  Total GAD-7 Score 16 0      PHQ2-9     Flowsheet Row ED from 01/16/2023 in [facility] Counselor from 07/26/2022 in [facility] Video Visit from 07/06/2021 in [facility] Primary Care at [facility] Office Visit from 01/12/2021 in [facility] Primary Care at [facility] Office Visit from 11/12/2020 in [facility]  PHQ-2 Total Score 2 5 2 2  0  PHQ-9 Total Score 11 20 14 9 4       Flowsheet Row ED from 01/16/2023 in [facility] ED from 10/04/2022 in [facility] Emergency Department at [facility] Counselor from 07/26/2022 in [facility]  C-SSRS RISK CATEGORY No Risk No Risk Low Risk        Musculoskeletal  Strength & Muscle Tone: within normal limits Gait & Station: normal Patient leans: N/A  Psychiatric Specialty Exam  Presentation  General Appearance:  Appropriate for Environment; Casual   Eye Contact: Good   Speech: Clear and Coherent; Normal Rate   Speech Volume: Normal   Handedness: Right    Mood and Affect  Mood: Euthymic   Affect: Appropriate; Congruent    Thought Process  Thought Processes: Coherent; Goal Directed; Linear   Descriptions of Associations:Intact   Orientation:Full (Time, Place and Person)   Thought Content:Logical   Diagnosis of Schizophrenia or Schizoaffective disorder in past: No     Hallucinations:Hallucinations: None    Ideas of Reference:None   Suicidal Thoughts:Suicidal Thoughts: No    Homicidal Thoughts:Homicidal Thoughts: No     Sensorium  Memory: Remote Good   Judgment: Good   Insight: Good    Executive Functions  Concentration: Good   Attention Span: Good   Recall: Good   Fund of Knowledge: Good   Language: Good    Psychomotor Activity  Psychomotor Activity: Psychomotor Activity: Normal     Assets  Assets: Communication Skills; Resilience; Desire  for Improvement    Sleep  Sleep: Sleep: Good     Physical Exam  Physical Exam Vitals and nursing note reviewed.  Constitutional:      General: He is not in acute distress.    Appearance: He is well-developed.  HENT:     Head: Normocephalic and atraumatic.  Eyes:     Conjunctiva/sclera: Conjunctivae normal.  Cardiovascular:     Rate and Rhythm: Normal rate and regular rhythm.     Heart sounds: No murmur heard. Pulmonary:     Effort: Pulmonary effort is normal. No respiratory distress.     Breath sounds: Normal breath sounds.  Abdominal:     Palpations: Abdomen is soft.     Tenderness: There is no abdominal tenderness.  Musculoskeletal:        General: No swelling.     Cervical back: Neck supple.  Skin:    General: Skin is warm and dry.     Capillary Refill: Capillary refill takes less than 2 seconds.  Neurological:     Mental Status: He is alert.  Psychiatric:        Mood and Affect: Mood normal.    Review of Systems  Respiratory:  Negative for shortness of breath.   Cardiovascular:  Negative for chest pain.  Gastrointestinal:  Negative for abdominal pain, constipation, diarrhea, heartburn, nausea and vomiting.  Neurological:  Negative for headaches.   Blood pressure 102/63, pulse 78, temperature 98.3 F (36.8 C), temperature source Oral, resp. rate 18, SpO2 97 %. There is no height or weight on file to calculate BMI.  ASSESSMENT FITZGERALD HATTEN is a 32 y.o. male with a history of MDD and low thiamine who presented voluntarily admitted to [facility] for alcohol detox.   Continues to be stable. Seeking court ordered 28 day residential rehab program.  PLAN Alcohol Use Disorder Malnutrition -CIWA with Ativan as needed (11>0). Received 1 dose of ativan on arrival despite no documented signs of anxiety or agitation. -MVI/thiamine -Discontinued scheduled Ativan taper -Continue melatonin 5 mg nightly -Ensure 2 times daily between meals -continue trazodone 100 mg qhs  prn for insomnia -continue hydroxyzine 25 mg tid prn for anxiety -Continue gabapentin 200 mg bid for paresthesia likely secondary to alcohol use -Consider naltrexone if patient is amenable over weekend  Dispo [facility] 01/23/23   Park Pope, MD 01/20/2023 7:47 AM

## 2023-01-20 NOTE — Progress Notes (Addendum)
Pt CIWA=1 this morning. Pt endorsing slight anxiety but no other symptoms. Pt states he slept better last night. Woke up once but was able to fall back asleep.

## 2023-01-20 NOTE — Group Note (Signed)
Group Topic: Relapse and Recovery  Group Date: 01/20/2023 Start Time: 0740 End Time: 0800 Facilitators: Rae Lips B  Department: [name]  Number of Participants: 5  Group Focus: acceptance Treatment Modality:  Leisure Development Interventions utilized were support Purpose: express feelings  Name: Xavier Ramsey Date of Birth: 01-17-1991  MR: [id number]    Level of Participation: active Quality of Participation: attentive Interactions with others: gave feedback Mood/Affect: appropriate Triggers (if applicable): NA Cognition: insightful Progress: Gaining insight Response: NA Plan: patient will be encouraged to keep going to groups.  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

## 2023-01-20 NOTE — ED Notes (Signed)
Pt just went to the bathroom at this hour. No apparent distress. RR even and unlabored. Monitored for safety.

## 2023-01-21 DIAGNOSIS — F1020 Alcohol dependence, uncomplicated: Secondary | ICD-10-CM | POA: Diagnosis not present

## 2023-01-21 DIAGNOSIS — Z9151 Personal history of suicidal behavior: Secondary | ICD-10-CM | POA: Diagnosis not present

## 2023-01-21 DIAGNOSIS — F331 Major depressive disorder, recurrent, moderate: Secondary | ICD-10-CM | POA: Diagnosis not present

## 2023-01-21 DIAGNOSIS — F419 Anxiety disorder, unspecified: Secondary | ICD-10-CM | POA: Diagnosis not present

## 2023-01-21 NOTE — ED Notes (Signed)
Patient calm - no complaints at this time - will continue to monitor for safety

## 2023-01-21 NOTE — ED Notes (Signed)
Patient is denying any concerns at this time - will continue to monitor for safety

## 2023-01-21 NOTE — ED Notes (Signed)
Pt is in the bed sleeping. Respirations are even and unlabored. No acute distress noted. Will continue to monitor for safety. 

## 2023-01-21 NOTE — ED Notes (Signed)
Patient has been sitting in the day room throughout the shift. Patient has been pleasant with staff and other patients. Patient denies SI/HI and AVH. Patient is being monitored for safety.

## 2023-01-21 NOTE — ED Provider Notes (Signed)
Behavioral Health Progress Note  Date and Time: 01/21/2023 10:59 AM Name: Xavier Ramsey MRN:  [id number]  Subjective:   Xavier Ramsey is a 32 yr old. male admitted to [facility] on 5/7 for alcohol detox.  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  He works that he is doing good today.  He reports no withdrawal symptoms.  He reports no cravings today.  He reports that he is still having some numbness in his feet but is doing much better.  He reports he is looking forward to going to [facility] on Monday.  He reports no SI, HI, or AVH.  He reports he slept well last night.  He reports appetite is doing good.  He reports having a mild headache last night but otherwise reports no other concerns at present.  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 Past Medical History: Multiple Concussions (10-20) Family History: Reports None Family Psychiatric  History: Reports None Social History: Lives with mother, unemployed  Additional Social History:    Pain Medications: See MAR Prescriptions: See MAR Over the Counter: See MAR History of alcohol / drug use?: Yes Longest period of sobriety (when/how long): 1 month, 1 week Negative Consequences of Use: Legal Withdrawal Symptoms: Sweats, Nausea / Vomiting, Tremors Name of Substance 1: Alcohol 1 - Age of First Use: 10 1 - Amount (size/oz): 1/5 1 - Frequency: Daily 1 - Duration: Ongoing 1 - Last Use / Amount: Today 1 - Method of Aquiring:  Purchase 1- Route of Use: Drinks orally                  Sleep: Good  Appetite:  Good  Current Medications:  Current Facility-Administered Medications  Medication Dose Route Frequency Provider Last Rate Last Admin   acetaminophen (TYLENOL) tablet 650 mg  650 mg Oral Q6H PRN Onuoha, Chinwendu V, NP       alum & mag hydroxide-simeth (MAALOX/MYLANTA) 200-200-20 MG/5ML suspension 30 mL  30 mL Oral Q4H PRN Onuoha, Chinwendu V, NP       feeding supplement (ENSURE ENLIVE / ENSURE PLUS) liquid 237 mL  237 mL Oral BID BM Park Pope, MD   237 mL at 01/21/23 0925   gabapentin (NEURONTIN) capsule 200 mg  200 mg Oral BID Park Pope, MD   200 mg at 01/21/23 4098   magnesium hydroxide (MILK OF MAGNESIA) suspension 30 mL  30 mL Oral Daily PRN Onuoha, Chinwendu V, NP       melatonin tablet 5 mg  5 mg Oral QHS Park Pope, MD   5 mg at 01/20/23 2129   multivitamin with minerals tablet 1 tablet  1 tablet Oral Daily Onuoha, Chinwendu V, NP   1 tablet at 01/21/23 0925   thiamine (VITAMIN B1) tablet 100 mg  100 mg Oral Daily Onuoha, Chinwendu V, NP   100 mg at 01/21/23 0925   traZODone (DESYREL) tablet 100 mg  100 mg Oral QHS PRN Sindy Guadeloupe, NP   100 mg at 01/20/23 2129   Current Outpatient Medications  Medication Sig Dispense Refill   gabapentin (NEURONTIN) 100 MG capsule Take 2 capsules (200 mg total) by mouth 2 (two) times daily. 120 capsule 0   melatonin 5 MG TABS Take 1 tablet (5 mg total) by mouth at bedtime.  0   traZODone (DESYREL) 100 MG tablet Take 1 tablet (100 mg total) by mouth at bedtime as needed for sleep. 30 tablet 0    Labs  Lab Results:  Admission on 01/16/2023  Component Date Value Ref Range Status   WBC 01/17/2023 5.0  4.0 - 10.5 K/uL Final   RBC 01/17/2023 4.11 (L)  4.22 - 5.81 MIL/uL Final   Hemoglobin 01/17/2023 13.5  13.0 - 17.0 g/dL Final   HCT 16/06/9603 38.1 (L)  39.0 - 52.0 % Final   MCV 01/17/2023 92.7  80.0 - 100.0 fL Final   MCH 01/17/2023 32.8  26.0 - 34.0 pg  Final   MCHC 01/17/2023 35.4  30.0 - 36.0 g/dL Final   RDW 54/05/8118 12.8  11.5 - 15.5 % Final   Platelets 01/17/2023 254  150 - 400 K/uL Final   nRBC 01/17/2023 0.0  0.0 - 0.2 % Final   Neutrophils Relative % 01/17/2023 42  % Final   Neutro Abs 01/17/2023 2.1  1.7 - 7.7 K/uL Final   Lymphocytes Relative 01/17/2023 48  % Final   Lymphs Abs 01/17/2023 2.4  0.7 - 4.0 K/uL Final   Monocytes Relative 01/17/2023 9  % Final   Monocytes Absolute 01/17/2023 0.5  0.1 - 1.0 K/uL Final   Eosinophils Relative 01/17/2023 0  % Final   Eosinophils Absolute 01/17/2023 0.0  0.0 - 0.5 K/uL Final   Basophils Relative 01/17/2023 1  % Final   Basophils Absolute 01/17/2023 0.0  0.0 - 0.1 K/uL Final   Immature Granulocytes 01/17/2023 0  % Final   Abs Immature Granulocytes 01/17/2023 0.01  0.00 - 0.07 K/uL Final   Performed at [facility], [address] [place], [place] [address]   Sodium 01/17/2023 135  135 - 145 mmol/L Final   Potassium 01/17/2023 3.6  3.5 - 5.1 mmol/L Final   Chloride 01/17/2023 97 (L)  98 - 111 mmol/L Final   CO2 01/17/2023 24  22 - 32 mmol/L Final   Glucose, Bld 01/17/2023 135 (H)  70 - 99 mg/dL Final   Glucose reference range applies only to samples taken after fasting for at least 8 hours.   BUN 01/17/2023 9  6 - 20 mg/dL Final   Creatinine, Ser 01/17/2023 1.20  0.61 - 1.24 mg/dL Final   Calcium 95/62/1308 9.2  8.9 - 10.3 mg/dL Final   Total Protein 65/78/4696 7.4  6.5 - 8.1 g/dL Final   Albumin 29/52/8413 4.2  3.5 - 5.0 g/dL Final   AST 24/40/1027 128 (H)  15 - 41 U/L Final   ALT 01/17/2023 104 (H)  0 - 44 U/L Final   Alkaline Phosphatase 01/17/2023 88  38 - 126 U/L Final   Total Bilirubin 01/17/2023 1.4 (H)  0.3 - 1.2 mg/dL Final   GFR, Estimated 01/17/2023 >60  >60 mL/min Final   Comment: (NOTE) Calculated using the CKD-EPI Creatinine Equation (2021)    Anion gap 01/17/2023 14  5 - 15 Final   Performed at [facility], [address] [place], [place]  [address]   Hgb A1c MFr Bld 01/17/2023 4.9  4.8 - 5.6 % Final   Comment: (NOTE) Pre diabetes:  5.7%-6.4%  Diabetes:              >6.4%  Glycemic control for   <7.0% adults with diabetes    Mean Plasma Glucose 01/17/2023 93.93  mg/dL Final   Performed at [facility], [address] [place], [place] [address]   Cholesterol 01/17/2023 173  0 - 200 mg/dL Final   Triglycerides 60/45/4098 199 (H)  <150 mg/dL Final   HDL 11/91/4782 65  >40 mg/dL Final   Total CHOL/HDL Ratio 01/17/2023 2.7  RATIO Final   VLDL 01/17/2023 40  0 - 40 mg/dL Final   LDL Cholesterol 01/17/2023 68  0 - 99 mg/dL Final   Comment:        Total Cholesterol/HDL:CHD Risk Coronary Heart Disease Risk Table                     Men   Women  1/2 Average Risk   3.4   3.3  Average Risk       5.0   4.4  2 X Average Risk   9.6   7.1  3 X Average Risk  23.4   11.0        Use the calculated Patient Ratio above and the CHD Risk Table to determine the patient's CHD Risk.        ATP III CLASSIFICATION (LDL):  <100     mg/dL   Optimal  956-213  mg/dL   Near or Above                    Optimal  130-159  mg/dL   Borderline  086-578  mg/dL   High  >469     mg/dL   Very High Performed at [facility], [address] [place], [place] [address]    TSH 01/17/2023 1.445  0.350 - 4.500 uIU/mL Final   Comment: Performed by a 3rd Generation assay with a functional sensitivity of <=0.01 uIU/mL. Performed at [facility], [address] [place], [place] [address]    POC Amphetamine UR 01/17/2023 None Detected  NONE DETECTED (Cut Off Level 1000 ng/mL) Final   POC Secobarbital (BAR) 01/17/2023 None Detected  NONE DETECTED (Cut Off Level 300 ng/mL) Final   POC Buprenorphine (BUP) 01/17/2023 None Detected  NONE DETECTED (Cut Off Level 10 ng/mL) Final   POC Oxazepam (BZO) 01/17/2023 Positive (A)  NONE DETECTED (Cut Off Level 300 ng/mL) Final   POC Cocaine UR 01/17/2023 None Detected  NONE DETECTED (Cut Off Level  300 ng/mL) Final   POC Methamphetamine UR 01/17/2023 None Detected  NONE DETECTED (Cut Off Level 1000 ng/mL) Final   POC Morphine 01/17/2023 None Detected  NONE DETECTED (Cut Off Level 300 ng/mL) Final   POC Methadone UR 01/17/2023 None Detected  NONE DETECTED (Cut Off Level 300 ng/mL) Final   POC Oxycodone UR 01/17/2023 None Detected  NONE DETECTED (Cut Off Level 100 ng/mL) Final   POC Marijuana UR 01/17/2023 Positive (A)  NONE DETECTED (Cut Off Level 50 ng/mL) Final   Alcohol, Ethyl (B) 01/17/2023 177 (H)  <10 mg/dL Final   Comment: (NOTE) Lowest detectable limit for serum alcohol is 10 mg/dL.  For medical purposes only. Performed at [facility], [address], [place], [place] [address]    Total Protein 01/19/2023 7.4  6.5 - 8.1 g/dL Final   Albumin 27/25/3664 4.3  3.5 - 5.0 g/dL Final   AST 40/34/7425 80 (H)  15 - 41 U/L Final   ALT 01/19/2023 91 (H)  0 - 44 U/L Final   Alkaline Phosphatase 01/19/2023 79  38 - 126 U/L Final   Total Bilirubin 01/19/2023 0.5  0.3 - 1.2 mg/dL Final   Bilirubin, Direct 01/19/2023 <0.1  0.0 - 0.2 mg/dL Final   Indirect Bilirubin 01/19/2023 NOT CALCULATED  0.3 - 0.9 mg/dL Final   Performed at [facility], [address], [place], [place] [address]   Hepatitis B Surface Ag 01/19/2023 NON REACTIVE  NON REACTIVE Final   HCV Ab 01/19/2023 NON REACTIVE  NON REACTIVE Final   Comment: (NOTE) Nonreactive HCV antibody screen is consistent with no HCV infections,  unless recent infection is suspected or other evidence exists to indicate HCV infection.     Hep A IgM 01/19/2023 NON REACTIVE  NON REACTIVE Final   Hep B C IgM 01/19/2023 NON REACTIVE  NON REACTIVE Final   Performed at [facility], [address], [place], [place] [address]    Blood Alcohol level:  Lab Results  Component Value Date   ETH 177 (H) 01/17/2023    Metabolic Disorder Labs: Lab Results  Component Value Date   HGBA1C 4.9 01/17/2023   MPG 93.93 01/17/2023    No results found for: "PROLACTIN" Lab Results  Component Value Date   CHOL 173 01/17/2023   TRIG 199 (H) 01/17/2023   HDL 65 01/17/2023   CHOLHDL 2.7 01/17/2023   VLDL 40 01/17/2023   LDLCALC 68 01/17/2023   LDLCALC 88 10/21/2020    Therapeutic Lab Levels: No results found for: "LITHIUM" No results found for: "VALPROATE" No results found for: "CBMZ"  Physical Findings   GAD-7    Flowsheet Row Video [facility] Office Visit from 10/21/2020 in [facility] at [facility]  Total GAD-7 Score 16 0      PHQ2-9    Flowsheet Row ED from 01/16/2023 in [facility] Counselor from 07/26/2022 in [facility] Video Visit from 07/06/2021 in [facility] Primary Care at [facility] Office Visit from 01/12/2021 in [facility] at [facility] Office Visit from 11/12/2020 in [facility]  PHQ-2 Total Score 2 5 2 2  0  PHQ-9 Total Score 11 20 14 9 4       Flowsheet Row ED from 01/16/2023 in [facility] ED from 10/04/2022 in [facility] Emergency Department at [facility] Counselor from 07/26/2022 in [facility]  C-SSRS RISK CATEGORY No Risk No Risk Low Risk        Musculoskeletal  Strength & Muscle Tone: within normal limits Gait & Station: normal Patient leans: N/A  Psychiatric Specialty Exam  Presentation  General Appearance:  Appropriate for Environment; Casual  Eye Contact: Good  Speech: Clear and Coherent; Normal Rate  Speech Volume: Normal  Handedness: Right   Mood and Affect  Mood: Euthymic  Affect: Congruent; Appropriate   Thought Process  Thought Processes: Coherent; Goal Directed  Descriptions of Associations:Intact  Orientation:Full (Time, Place and Person)  Thought Content:Logical; WDL  Diagnosis of Schizophrenia or  Schizoaffective disorder in past: No    Hallucinations:Hallucinations: None  Ideas of Reference:None  Suicidal Thoughts:Suicidal Thoughts: No  Homicidal Thoughts:Homicidal Thoughts: No   Sensorium  Memory: Immediate Fair; Recent Fair  Judgment: Good  Insight: Good   Executive Functions  Concentration: Good  Attention Span: Good  Recall: Good  Fund of Knowledge: Good  Language: Good   Psychomotor Activity  Psychomotor Activity:Psychomotor Activity: Normal   Assets  Assets: Communication Skills; Desire for Improvement; Physical Health; Resilience   Sleep  Sleep:Sleep: Good   No data recorded  Physical Exam  Physical Exam Vitals and nursing note reviewed.  Constitutional:      General: He is not in acute distress.    Appearance: Normal appearance. He is normal weight. He is not ill-appearing or toxic-appearing.  HENT:     Head: Normocephalic and atraumatic.  Pulmonary:     Effort: Pulmonary effort is normal.  Musculoskeletal:        General: Normal range of motion.  Neurological:     General: No focal deficit present.     Mental Status: He is alert.    Review of Systems  Respiratory:  Negative for cough and shortness of breath.   Cardiovascular:  Negative for chest pain.  Gastrointestinal:  Negative for abdominal pain, constipation, diarrhea, nausea and vomiting.  Neurological:  Negative for dizziness, weakness and headaches.  Psychiatric/Behavioral:  Negative for depression, hallucinations and suicidal ideas. The patient is not nervous/anxious.    Blood pressure 119/85, pulse 85, temperature 98.3 F (36.8 C), temperature source Tympanic, resp. rate 16, SpO2 98 %. There is no height or weight on file to calculate BMI.  Treatment Plan Summary: Daily contact with patient to assess and evaluate symptoms and progress in treatment and Medication management  Xavier Ramsey is a 32 yr old. male admitted to [facility] on 5/7 for alcohol detox.  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   Xavier Ramsey is tolerating withdrawal well.  He continues to have some neuropathy but is reporting that overall he is doing better.  We will not make any changes to his medications at this time.  We will continue to plan on discharging him to [facility] on Monday.  We will continue to monitor.   Withdrawal: -Continue CIWA, last score= 0   0617  5/11 -Continue Thiamine 100 mg daily for nutritional supplementation -Continue Multivitamin daily for nutritional supplementation   Neuropathy: -Continue Gabapentin 200 mg BID   Insomnia: -Continue Melatonin 5 mg QHS   -Continue Ensure BID -Continue PRN's: Tylenol, Maalox, Atarax, Milk of Magnesia, Trazodone   Dispo: Discharge to [facility] 5/13    Lauro Franklin, MD 01/21/2023 10:59 AM

## 2023-01-22 DIAGNOSIS — Z9151 Personal history of suicidal behavior: Secondary | ICD-10-CM | POA: Diagnosis not present

## 2023-01-22 DIAGNOSIS — F419 Anxiety disorder, unspecified: Secondary | ICD-10-CM | POA: Diagnosis not present

## 2023-01-22 DIAGNOSIS — F331 Major depressive disorder, recurrent, moderate: Secondary | ICD-10-CM | POA: Diagnosis not present

## 2023-01-22 DIAGNOSIS — F1020 Alcohol dependence, uncomplicated: Secondary | ICD-10-CM | POA: Diagnosis not present

## 2023-01-22 NOTE — ED Provider Notes (Signed)
FBC/OBS ASAP Discharge Summary  Date and Time: 01/22/2023 3:02 PM  Name: Xavier Ramsey  MRN:  [id number]   Discharge Diagnoses:  Final diagnoses:  Alcohol use disorder, severe, dependence (HCC)  Moderate episode of recurrent major depressive disorder (HCC)  Substance abuse (HCC)  Marijuana user    Subjective:  Xavier Ramsey is a 32 yr old. male admitted to [facility] on 5/7 for alcohol detox.  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   On day of discharge he reports that he feels good and is ready to go to [facility].  He reports he feels more energized than he has in a long time.  He reports no withdrawal symptoms.  He reports no cravings.  He reports that the gabapentin is helping with his neuropathy.  Discussed the potential for the symptoms to resolve if he maintains his sobriety but that it is not guaranteed and could take several months to years.  Encouraged him to continue to follow up with his PCP over this.  He reports no SI, HI, or AVH.  He reports sleep is good.  He reports appetite is doing good.  He reports no other concerns present.  Stay Summary:  He was admitted to [facility] on 5/7 for Detox off EtOH.  He tolerated detox relatively well.  Due to his neuropathy he was started on gabapentin.  This was titrated and he reported significant help from it.  He wanted residential rehab treatment and was accepted to [facility].  He was discharged to [facility].   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 Past Medical History: Multiple Concussions (10-20) Family History: Reports None Family Psychiatric  History: Reports None Social History: Lives with mother,  unemployed Tobacco Cessation:  A prescription for an FDA-approved tobacco cessation medication was offered at discharge and the patient refused  Current Medications:  Current Facility-Administered Medications  Medication Dose Route Frequency Provider Last Rate Last Admin   acetaminophen (TYLENOL) tablet 650 mg  650 mg Oral Q6H PRN Onuoha, Chinwendu V, NP       alum & mag hydroxide-simeth (MAALOX/MYLANTA) 200-200-20 MG/5ML suspension 30 mL  30 mL Oral Q4H PRN Onuoha, Chinwendu V, NP       feeding supplement (ENSURE ENLIVE / ENSURE PLUS) liquid 237 mL  237 mL Oral BID BM Park Pope, MD   237 mL at 01/22/23 1315   gabapentin (NEURONTIN) capsule 200 mg  200 mg Oral BID Park Pope, MD   200 mg at 01/22/23 0950   magnesium hydroxide (MILK OF MAGNESIA) suspension 30 mL  30 mL Oral Daily PRN Onuoha, Chinwendu V, NP       melatonin tablet 5 mg  5 mg Oral QHS Park Pope, MD   5 mg at 01/21/23 2142   multivitamin with minerals tablet 1 tablet  1 tablet Oral Daily Onuoha, Chinwendu V, NP   1 tablet at 01/22/23 0950   thiamine (VITAMIN B1) tablet 100 mg  100 mg Oral Daily Onuoha, Chinwendu V, NP   100 mg at 01/22/23 0950   traZODone (DESYREL) tablet 100 mg  100 mg Oral QHS PRN Sindy Guadeloupe, NP   100 mg at 01/20/23 2129   Current Outpatient Medications  Medication Sig  Dispense Refill   gabapentin (NEURONTIN) 100 MG capsule Take 2 capsules (200 mg total) by mouth 2 (two) times daily. 120 capsule 0   melatonin 5 MG TABS Take 1 tablet (5 mg total) by mouth at bedtime.  0   traZODone (DESYREL) 100 MG tablet Take 1 tablet (100 mg total) by mouth at bedtime as needed for sleep. 30 tablet 0    PTA Medications:  Facility Ordered Medications  Medication   acetaminophen (TYLENOL) tablet 650 mg   alum & mag hydroxide-simeth (MAALOX/MYLANTA) 200-200-20 MG/5ML suspension 30 mL   magnesium hydroxide (MILK OF MAGNESIA) suspension 30 mL   [COMPLETED] thiamine (VITAMIN B1) injection 100 mg   thiamine (VITAMIN B1)  tablet 100 mg   multivitamin with minerals tablet 1 tablet   [EXPIRED] LORazepam (ATIVAN) tablet 1 mg   [EXPIRED] hydrOXYzine (ATARAX) tablet 25 mg   [EXPIRED] loperamide (IMODIUM) capsule 2-4 mg   [EXPIRED] ondansetron (ZOFRAN-ODT) disintegrating tablet 4 mg   [COMPLETED] carvedilol (COREG) tablet 3.125 mg   feeding supplement (ENSURE ENLIVE / ENSURE PLUS) liquid 237 mL   traZODone (DESYREL) tablet 100 mg   gabapentin (NEURONTIN) capsule 200 mg   melatonin tablet 5 mg   PTA Medications  Medication Sig   traZODone (DESYREL) 100 MG tablet Take 1 tablet (100 mg total) by mouth at bedtime as needed for sleep.   melatonin 5 MG TABS Take 1 tablet (5 mg total) by mouth at bedtime.   gabapentin (NEURONTIN) 100 MG capsule Take 2 capsules (200 mg total) by mouth 2 (two) times daily.       01/22/2023   12:51 PM 01/19/2023    1:42 PM 01/17/2023   12:24 AM  Depression screen PHQ 2/9  Decreased Interest 3 1 2   Down, Depressed, Hopeless 1 1 1   PHQ - 2 Score 4 2 3   Altered sleeping 0 3 3  Tired, decreased energy 0 2 2  Change in appetite 0 1 3  Feeling bad or failure about yourself  1 1 1   Trouble concentrating 1 1 1   Moving slowly or fidgety/restless 0 1 1  Suicidal thoughts 0 0 0  PHQ-9 Score 6 11 14   Difficult doing work/chores   Somewhat difficult    Flowsheet Row ED from 01/16/2023 in [facility] ED from 10/04/2022 in [facility] Emergency Department at [facility] Counselor from 07/26/2022 in [facility]  C-SSRS RISK CATEGORY No Risk No Risk Low Risk       Musculoskeletal  Strength & Muscle Tone: within normal limits Gait & Station: normal Patient leans: N/A  Psychiatric Specialty Exam  Presentation  General Appearance:  Appropriate for Environment; Casual  Eye Contact: Good  Speech: Clear and Coherent; Normal Rate  Speech Volume: Normal  Handedness: Right   Mood and Affect   Mood: Euthymic  Affect: Appropriate; Congruent   Thought Process  Thought Processes: Coherent; Goal Directed  Descriptions of Associations:Intact  Orientation:Full (Time, Place and Person)  Thought Content:WDL; Logical  Diagnosis of Schizophrenia or Schizoaffective disorder in past: No    Hallucinations:Hallucinations: None  Ideas of Reference:None  Suicidal Thoughts:Suicidal Thoughts: No  Homicidal Thoughts:Homicidal Thoughts: No   Sensorium  Memory: Immediate Fair; Recent Fair  Judgment: Good  Insight: Good   Executive Functions  Concentration: Good  Attention Span: Good  Recall: Good  Fund of Knowledge: Good  Language: Good   Psychomotor Activity  Psychomotor Activity: Psychomotor Activity: Normal   Assets  Assets: Communication Skills;  Desire for Improvement; Physical Health; Resilience   Sleep  Sleep: Sleep: Good   No data recorded  Physical Exam  Physical Exam Vitals and nursing note reviewed.  Constitutional:      General: He is not in acute distress.    Appearance: Normal appearance. He is normal weight. He is not ill-appearing or toxic-appearing.  HENT:     Head: Normocephalic and atraumatic.  Pulmonary:     Effort: Pulmonary effort is normal.  Musculoskeletal:        General: Normal range of motion.  Neurological:     General: No focal deficit present.     Mental Status: He is alert.    Review of Systems  Respiratory:  Negative for cough and shortness of breath.   Cardiovascular:  Negative for chest pain.  Gastrointestinal:  Negative for abdominal pain, constipation, diarrhea, nausea and vomiting.  Neurological:  Negative for dizziness, weakness and headaches.  Psychiatric/Behavioral:  Negative for depression, hallucinations and suicidal ideas. The patient is not nervous/anxious.    Blood pressure (!) 135/95, pulse 78, temperature 97.9 F (36.6 C), temperature source Tympanic, resp. rate 18, SpO2 100 %. There  is no height or weight on file to calculate BMI.  Demographic Factors:  Male and Low socioeconomic status  Loss Factors: Legal issues  Historical Factors: Prior suicide attempts  Risk Reduction Factors:   Living with another person, especially a relative, Positive social support, and Positive coping skills or problem solving skills  Continued Clinical Symptoms:  More than one psychiatric diagnosis Previous Psychiatric Diagnoses and Treatments  Cognitive Features That Contribute To Risk:  None    Suicide Risk:  Mild:  No Suicidal ideation.  There are no identifiable plans, no associated intent, mild dysphoria and related symptoms, good self-control (both objective and subjective assessment), few other risk factors, and identifiable protective factors, including available and accessible social support.  He does have a history of a prior attempt so there is some risk present.  Plan Of Care/Follow-up recommendations/Disposition:  You will be discharged to [facility] to continue supporting your Sobriety.  Activity: as tolerated  Diet: heart healthy  Other: -Follow-up with your outpatient psychiatric provider -instructions on appointment date, time, and address (location) are provided to you in discharge paperwork.  -Take your psychiatric medications as prescribed at discharge - instructions are provided to you in the discharge paperwork  -Follow-up with outpatient primary care doctor and other specialists -for management of chronic medical disease, including: Substance Abuse and Neuropathy  -Testing: Follow-up with outpatient provider for abnormal lab results: Elevated AST/ALT and Triglycerides  -Recommend abstinence from alcohol, tobacco, and other illicit drug use at discharge.   -If your psychiatric symptoms recur, worsen, or if you have side effects to your psychiatric medications, call your outpatient psychiatric provider, 911, [phone] or go to the nearest emergency  department.  -If suicidal thoughts recur, call your outpatient psychiatric provider, 911, [phone] or go to the nearest emergency department.   Lauro Franklin, MD 01/22/2023, 3:02 PM

## 2023-01-22 NOTE — ED Notes (Signed)
Patient was provided with breakfast 

## 2023-01-22 NOTE — ED Notes (Signed)
Patient denies SI,HI,AVH. Morning medication administered without difficulty to patient. Patient participating well in groups. Patient is cooperative and interacts well with staff. Patient shows no signs of ETOH withdrawal and opioid withdrawal. Respiratory is even and unlabored. No distress noted. Patient is in day room at present. Will continue to monitor for safety.

## 2023-01-22 NOTE — ED Notes (Signed)
Patient was provided with lunch 

## 2023-01-22 NOTE — ED Notes (Signed)
Patient was provided with dinner 

## 2023-01-22 NOTE — Group Note (Signed)
Group Topic: Wellness  Group Date: 01/22/2023 Start Time: 1135 End Time: 1205 Facilitators: Maeola Sarah  Department: [facility]  Number of Participants: 8  Group Focus: coping skills Treatment Modality:  Psychoeducation Interventions utilized were patient education Purpose: enhance coping skills and increase insight  Name: Xavier Ramsey Date of Birth: December 12, 1990  MR: [id number]    Level of Participation: active Quality of Participation: attentive, cooperative, and engaged Interactions with others: positive and respectful Mood/Affect: appropriate and positive Triggers (if applicable): N/A Cognition: coherent/clear Progress: Gaining insight Response: Patient conversed with staff and group members about what coping skills are and when they are used as well as examples of negative & positive coping skills. 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

## 2023-01-22 NOTE — ED Notes (Signed)
Patient resting in room in no sxs of distress - will continue to monitor for safety

## 2023-01-22 NOTE — ED Notes (Signed)
Received patient this AM. Patient in day room for breakfast. Patient respirations are even and unlabored. Will continue to monitor for safety.

## 2023-01-22 NOTE — ED Notes (Signed)
Patient resting quietly in bed with eyes closed. Respirations equal and unlabored, skin warm and dry, NAD. No change in assessment or acuity. Q 15 minute safety checks remain in place.   

## 2023-01-22 NOTE — Group Note (Signed)
Group Topic: Recovery Basics  Group Date: 01/21/2023 Start Time: 0740 End Time: 0930 Facilitators: Rae Lips B  Department: [facility]  Number of Participants: 7  Group Focus: abuse issues and activities of daily living skills Treatment Modality:  Leisure Development Interventions utilized were leisure development Purpose: enhance coping skills and express feelings  Name: Xavier Ramsey Date of Birth: 01-27-1991  MR: [id number]    Level of Participation: active Quality of Participation: attentive Interactions with others: gave feedback Mood/Affect: appropriate Triggers (if applicable): NA Cognition: coherent/clear Progress: Significant Response: NA Plan: patient will be encouraged to keep going to groups  Patients Problems:  Patient Active Problem List   Diagnosis Date Noted   Alcohol use disorder, severe, dependence (HCC) 01/17/2023   MDD (major depressive disorder), recurrent episode (HCC) 07/26/2022   Anxiety 07/26/2022   Postconcussive syndrome 07/26/2022   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

## 2023-01-23 DIAGNOSIS — F419 Anxiety disorder, unspecified: Secondary | ICD-10-CM | POA: Diagnosis not present

## 2023-01-23 DIAGNOSIS — F331 Major depressive disorder, recurrent, moderate: Secondary | ICD-10-CM | POA: Diagnosis not present

## 2023-01-23 DIAGNOSIS — F1020 Alcohol dependence, uncomplicated: Secondary | ICD-10-CM | POA: Diagnosis not present

## 2023-01-23 DIAGNOSIS — Z9151 Personal history of suicidal behavior: Secondary | ICD-10-CM | POA: Diagnosis not present

## 2023-01-23 NOTE — Discharge Planning (Signed)
Patient reports being excited about discharging on today and continuing his journey of recovery. No concerns were reported by the patient. Taxi has been called for patient to discharge to [facility] by 9:00am. LCSW to sign off at this time. Please inform if further LCSW needs arise prior to discharge.   Fernande Boyden, LCSW Clinical Social Worker [facility] Ph: [phone]

## 2023-01-23 NOTE — ED Notes (Addendum)
Patient observed/assessed in room in bed appearing in no immediate distress resting peacefully. Q15 minute checks continued by MHT and nursing staff. Will continue to monitor and support. 

## 2023-01-23 NOTE — ED Notes (Signed)
Patient was provided with breakfast 

## 2023-01-23 NOTE — ED Notes (Signed)
Patient is currently in the dining room talking to the other patients, no distress noted, will continue to monitor patient for safety.

## 2023-04-05 ENCOUNTER — Other Ambulatory Visit: Payer: Self-pay

## 2023-04-05 ENCOUNTER — Inpatient Hospital Stay (HOSPITAL_COMMUNITY)
Admission: EM | Admit: 2023-04-05 | Discharge: 2023-04-07 | DRG: 368 | Disposition: A | Discharge status: Home / Self Care | Payer: Medicaid Other | Attending: Family Medicine | Admitting: Family Medicine

## 2023-04-05 ENCOUNTER — Emergency Department (HOSPITAL_COMMUNITY): Payer: Medicaid Other

## 2023-04-05 DIAGNOSIS — K760 Fatty (change of) liver, not elsewhere classified: Secondary | ICD-10-CM | POA: Diagnosis present

## 2023-04-05 DIAGNOSIS — R7401 Elevation of levels of liver transaminase levels: Secondary | ICD-10-CM | POA: Diagnosis present

## 2023-04-05 DIAGNOSIS — K922 Gastrointestinal hemorrhage, unspecified: Secondary | ICD-10-CM | POA: Diagnosis present

## 2023-04-05 DIAGNOSIS — F32A Depression, unspecified: Secondary | ICD-10-CM | POA: Diagnosis present

## 2023-04-05 DIAGNOSIS — Z720 Tobacco use: Secondary | ICD-10-CM | POA: Diagnosis present

## 2023-04-05 DIAGNOSIS — F1010 Alcohol abuse, uncomplicated: Secondary | ICD-10-CM | POA: Diagnosis present

## 2023-04-05 DIAGNOSIS — N179 Acute kidney failure, unspecified: Secondary | ICD-10-CM | POA: Diagnosis present

## 2023-04-05 DIAGNOSIS — F17210 Nicotine dependence, cigarettes, uncomplicated: Secondary | ICD-10-CM | POA: Diagnosis present

## 2023-04-05 DIAGNOSIS — Z8249 Family history of ischemic heart disease and other diseases of the circulatory system: Secondary | ICD-10-CM

## 2023-04-05 DIAGNOSIS — K2101 Gastro-esophageal reflux disease with esophagitis, with bleeding: Principal | ICD-10-CM | POA: Diagnosis present

## 2023-04-05 DIAGNOSIS — R1013 Epigastric pain: Principal | ICD-10-CM

## 2023-04-05 DIAGNOSIS — F419 Anxiety disorder, unspecified: Secondary | ICD-10-CM | POA: Diagnosis present

## 2023-04-05 DIAGNOSIS — I10 Essential (primary) hypertension: Secondary | ICD-10-CM | POA: Diagnosis present

## 2023-04-05 DIAGNOSIS — R634 Abnormal weight loss: Secondary | ICD-10-CM | POA: Diagnosis present

## 2023-04-05 DIAGNOSIS — Z79899 Other long term (current) drug therapy: Secondary | ICD-10-CM

## 2023-04-05 DIAGNOSIS — D72829 Elevated white blood cell count, unspecified: Secondary | ICD-10-CM | POA: Diagnosis present

## 2023-04-05 DIAGNOSIS — K449 Diaphragmatic hernia without obstruction or gangrene: Secondary | ICD-10-CM | POA: Diagnosis present

## 2023-04-05 DIAGNOSIS — R112 Nausea with vomiting, unspecified: Secondary | ICD-10-CM

## 2023-04-05 DIAGNOSIS — K2921 Alcoholic gastritis with bleeding: Secondary | ICD-10-CM | POA: Diagnosis present

## 2023-04-05 DIAGNOSIS — Z6824 Body mass index (BMI) 24.0-24.9, adult: Secondary | ICD-10-CM

## 2023-04-05 DIAGNOSIS — K920 Hematemesis: Secondary | ICD-10-CM | POA: Diagnosis present

## 2023-04-05 DIAGNOSIS — E860 Dehydration: Secondary | ICD-10-CM | POA: Diagnosis present

## 2023-04-05 DIAGNOSIS — K209 Esophagitis, unspecified without bleeding: Secondary | ICD-10-CM

## 2023-04-05 DIAGNOSIS — K2970 Gastritis, unspecified, without bleeding: Secondary | ICD-10-CM

## 2023-04-05 LAB — CBC
HCT: 44.8 % (ref 39.0–52.0)
Hemoglobin: 15.1 g/dL (ref 13.0–17.0)
MCH: 32.1 pg (ref 26.0–34.0)
MCHC: 33.7 g/dL (ref 30.0–36.0)
MCV: 95.1 fL (ref 80.0–100.0)
Platelets: 340 10*3/uL (ref 150–400)
RBC: 4.71 MIL/uL (ref 4.22–5.81)
RDW: 14.3 % (ref 11.5–15.5)
WBC: 11.2 10*3/uL — ABNORMAL HIGH (ref 4.0–10.5)
nRBC: 0 % (ref 0.0–0.2)

## 2023-04-05 LAB — ACETAMINOPHEN LEVEL: Acetaminophen (Tylenol), Serum: <10 ug/mL — ABNORMAL LOW (ref 10–30)

## 2023-04-05 LAB — SALICYLATE LEVEL: Salicylate Lvl: <7 mg/dL — ABNORMAL LOW (ref 7.0–30.0)

## 2023-04-05 LAB — COMPREHENSIVE METABOLIC PANEL
ALT: 86 U/L — ABNORMAL HIGH (ref 0–44)
AST: 133 U/L — ABNORMAL HIGH (ref 15–41)
Albumin: 4.4 g/dL (ref 3.5–5.0)
Alkaline Phosphatase: 86 U/L (ref 38–126)
BUN: 13 mg/dL (ref 6–20)
CO2: <7 mmol/L — ABNORMAL LOW (ref 22–32)
Calcium: 8.6 mg/dL — ABNORMAL LOW (ref 8.9–10.3)
Chloride: 91 mmol/L — ABNORMAL LOW (ref 98–111)
Creatinine, Ser: 1.44 mg/dL — ABNORMAL HIGH (ref 0.61–1.24)
GFR, Estimated: >60 mL/min (ref >60)
Glucose, Bld: 222 mg/dL — ABNORMAL HIGH (ref 70–99)
Potassium: 5.1 mmol/L (ref 3.5–5.1)
Sodium: 133 mmol/L — ABNORMAL LOW (ref 135–145)
Total Bilirubin: 1 mg/dL (ref 0.3–1.2)
Total Protein: 7.8 g/dL (ref 6.5–8.1)

## 2023-04-05 LAB — ETHANOL: Alcohol, Ethyl (B): 47 mg/dL — ABNORMAL HIGH (ref <10)

## 2023-04-05 LAB — LIPASE, BLOOD: Lipase: 54 U/L — ABNORMAL HIGH (ref 11–51)

## 2023-04-05 MED ORDER — PANTOPRAZOLE SODIUM 40 MG IV SOLR
40.0000 mg | Freq: Once | INTRAVENOUS | Status: AC
  Administered 2023-04-05: 40 mg via INTRAVENOUS
  Filled 2023-04-05: qty 10

## 2023-04-05 MED ORDER — LORAZEPAM 1 MG PO TABS
1.0000 mg | ORAL_TABLET | ORAL | Status: DC | PRN
  Administered 2023-04-06: 1 mg via ORAL
  Administered 2023-04-06: 2 mg via ORAL
  Filled 2023-04-05: qty 1
  Filled 2023-04-05: qty 2

## 2023-04-05 MED ORDER — MORPHINE SULFATE (PF) 4 MG/ML IV SOLN
4.0000 mg | Freq: Once | INTRAVENOUS | Status: AC
  Administered 2023-04-05: 4 mg via INTRAVENOUS
  Filled 2023-04-05: qty 1

## 2023-04-05 MED ORDER — LORAZEPAM 2 MG/ML IJ SOLN
1.0000 mg | INTRAMUSCULAR | Status: DC | PRN
  Administered 2023-04-06: 1 mg via INTRAVENOUS
  Filled 2023-04-05: qty 1

## 2023-04-05 MED ORDER — ADULT MULTIVITAMIN W/MINERALS CH
1.0000 | ORAL_TABLET | Freq: Every day | ORAL | Status: DC
  Administered 2023-04-06: 1 via ORAL
  Filled 2023-04-05: qty 1

## 2023-04-05 MED ORDER — FENTANYL CITRATE PF 50 MCG/ML IJ SOSY
50.0000 ug | PREFILLED_SYRINGE | INTRAMUSCULAR | Status: DC | PRN
  Administered 2023-04-05: 50 ug via INTRAVENOUS
  Filled 2023-04-05: qty 1

## 2023-04-05 MED ORDER — FOLIC ACID 1 MG PO TABS
1.0000 mg | ORAL_TABLET | Freq: Every day | ORAL | Status: DC
  Administered 2023-04-06: 1 mg via ORAL
  Filled 2023-04-05: qty 1

## 2023-04-05 MED ORDER — SODIUM CHLORIDE 0.9 % IV SOLN: Freq: Once | INTRAVENOUS | Status: AC

## 2023-04-05 MED ORDER — THIAMINE MONONITRATE 100 MG PO TABS: 100.0000 mg | ORAL_TABLET | Freq: Every day | ORAL | Status: DC

## 2023-04-05 MED ORDER — PANTOPRAZOLE SODIUM 40 MG IV SOLR: 40.0000 mg | Freq: Once | INTRAVENOUS | Status: DC

## 2023-04-05 MED ORDER — ONDANSETRON HCL 4 MG/2ML IJ SOLN
4.0000 mg | Freq: Once | INTRAMUSCULAR | Status: AC | PRN
  Administered 2023-04-05: 4 mg via INTRAVENOUS
  Filled 2023-04-05: qty 2

## 2023-04-05 MED ORDER — IOHEXOL 350 MG/ML SOLN
75.0000 mL | Freq: Once | INTRAVENOUS | Status: AC | PRN
  Administered 2023-04-05: 75 mL via INTRAVENOUS

## 2023-04-05 MED ORDER — SODIUM CHLORIDE 0.9 % IV BOLUS
1000.0000 mL | Freq: Once | INTRAVENOUS | Status: AC
  Administered 2023-04-06: 1000 mL via INTRAVENOUS

## 2023-04-05 MED ORDER — THIAMINE HCL 100 MG/ML IJ SOLN
100.0000 mg | Freq: Every day | INTRAMUSCULAR | Status: DC
  Administered 2023-04-05 – 2023-04-06 (×2): 100 mg via INTRAVENOUS
  Filled 2023-04-05 (×2): qty 2

## 2023-04-05 MED ORDER — ONDANSETRON HCL 4 MG/2ML IJ SOLN
4.0000 mg | Freq: Once | INTRAMUSCULAR | Status: AC
  Administered 2023-04-06: 4 mg via INTRAVENOUS
  Filled 2023-04-05: qty 2

## 2023-04-05 NOTE — ED Provider Notes (Signed)
[place] EMERGENCY DEPARTMENT AT [facility] Provider Note   CSN: [id number] Arrival date & time: 04/05/23  1944     History  Chief Complaint  Patient presents with   Abdominal Pain    Xavier Ramsey is a 32 y.o. male.  32 year old male with a past medical history of alcohol abuse, mental health presents to the ED with a chief complaint of abdominal pain, nausea vomiting since earlier this morning.  Endorsing generalized abdominal pain, had multiple episodes of bloody emesis, reported this was likely dark red.  Has not had a prior history of esophageal rupture.  He was given fentanyl by EMS with some improvement in symptoms.  Not actively vomiting on arrival.  Aggravated with any oral intake, no alleviating factors.  Currently not anticoagulated, no fevers, no blood in his stool. Also endorsing chest pain every time he vomits.  The history is provided by the patient.  Abdominal Pain Associated symptoms: nausea and vomiting   Associated symptoms: no chest pain, no chills, no fever, no shortness of breath and no sore throat        Home Medications Prior to Admission medications   Medication Sig Start Date End Date Taking? Authorizing Provider  gabapentin (NEURONTIN) 100 MG capsule Take 2 capsules (200 mg total) by mouth 2 (two) times daily. 01/20/23 02/19/23  Park Pope, MD  melatonin 5 MG TABS Take 1 tablet (5 mg total) by mouth at bedtime. 01/20/23   Park Pope, MD  traZODone (DESYREL) 100 MG tablet Take 1 tablet (100 mg total) by mouth at bedtime as needed for sleep. 01/20/23   Park Pope, MD      Allergies    Patient has no known allergies.    Review of Systems   Review of Systems  Constitutional:  Negative for chills and fever.  HENT:  Negative for sore throat.   Respiratory:  Negative for shortness of breath.   Cardiovascular:  Negative for chest pain.  Gastrointestinal:  Positive for abdominal pain, nausea and vomiting.  Genitourinary:  Negative for flank  pain.  Musculoskeletal:  Negative for back pain.  Skin:  Negative for pallor and wound.  All other systems reviewed and are negative.   Physical Exam Updated Vital Signs BP (!) 153/100   Pulse (!) 116   Temp 98.3 F (36.8 C) (Oral)   Resp (!) 21   SpO2 100%  Physical Exam Vitals and nursing note reviewed.  Constitutional:      Appearance: He is well-developed.  HENT:     Head: Normocephalic and atraumatic.  Eyes:     General: No scleral icterus.    Pupils: Pupils are equal, round, and reactive to light.  Cardiovascular:     Rate and Rhythm: Tachycardia present.     Heart sounds: Normal heart sounds.  Pulmonary:     Effort: Pulmonary effort is normal.     Breath sounds: Normal breath sounds. No wheezing.  Chest:     Chest wall: No tenderness.  Abdominal:     General: Abdomen is flat. Bowel sounds are normal. There is no distension.     Palpations: Abdomen is soft.     Tenderness: There is generalized abdominal tenderness.  Musculoskeletal:        General: No tenderness or deformity.     Cervical back: Normal range of motion.  Skin:    General: Skin is warm and dry.  Neurological:     Mental Status: He is alert and oriented to person,  place, and time.     ED Results / Procedures / Treatments   Labs (all labs ordered are listed, but only abnormal results are displayed) Labs Reviewed  LIPASE, BLOOD - Abnormal; Notable for the following components:      Result Value   Lipase 54 (*)    All other components within normal limits  COMPREHENSIVE METABOLIC PANEL - Abnormal; Notable for the following components:   Sodium 133 (*)    Chloride 91 (*)    CO2 <7 (*)    Glucose, Bld 222 (*)    Creatinine, Ser 1.44 (*)    Calcium 8.6 (*)    AST 133 (*)    ALT 86 (*)    All other components within normal limits  CBC - Abnormal; Notable for the following components:   WBC 11.2 (*)    All other components within normal limits  ETHANOL - Abnormal; Notable for the following  components:   Alcohol, Ethyl (B) 47 (*)    All other components within normal limits  SALICYLATE LEVEL - Abnormal; Notable for the following components:   Salicylate Lvl <7.0 (*)    All other components within normal limits  ACETAMINOPHEN LEVEL - Abnormal; Notable for the following components:   Acetaminophen (Tylenol), Serum <10 (*)    All other components within normal limits  URINALYSIS, ROUTINE W REFLEX MICROSCOPIC  TYPE AND SCREEN  ABO/RH    EKG None  Radiology CT CHEST ABDOMEN PELVIS W CONTRAST  Result Date: 04/05/2023 CLINICAL DATA:  Varicose veins EXAM: CT CHEST, ABDOMEN, AND PELVIS WITH CONTRAST TECHNIQUE: Multidetector CT imaging of the chest, abdomen and pelvis was performed following the standard protocol during bolus administration of intravenous contrast. RADIATION DOSE REDUCTION: This exam was performed according to the departmental dose-optimization program which includes automated exposure control, adjustment of the mA and/or kV according to patient size and/or use of iterative reconstruction technique. CONTRAST:  75mL OMNIPAQUE IOHEXOL 350 MG/ML SOLN COMPARISON:  None Available. FINDINGS: CT CHEST FINDINGS Cardiovascular: Heart is normal size. Aorta is normal caliber. Mediastinum/Nodes: No mediastinal, hilar, or axillary adenopathy. Trachea and thyroid unremarkable. Diffusely thickened esophagus with paraesophageal edema/stranding most compatible with severe esophagitis. Lungs/Pleura: Lungs are clear. No focal airspace opacities or suspicious nodules. Trace right pleural effusion. Musculoskeletal: Chest wall soft tissues are unremarkable. No acute bony abnormality. CT ABDOMEN PELVIS FINDINGS Hepatobiliary: Diffuse low-density throughout the liver compatible with fatty infiltration. No focal abnormality. Gallbladder unremarkable. Pancreas: No focal abnormality or ductal dilatation. Spleen: No focal abnormality.  Normal size. Adrenals/Urinary Tract: No adrenal abnormality. No  focal renal abnormality. No stones or hydronephrosis. Urinary bladder is unremarkable. Stomach/Bowel: Stomach is distended with fluid and gas. Large and small bowel decompressed, unremarkable. Normal appendix. Vascular/Lymphatic: No evidence of aneurysm or adenopathy. Venous structures widely patent. Reproductive: No visible focal abnormality. Other: No free fluid or free air. Musculoskeletal: No acute bony abnormality. IMPRESSION: Severe diffuse esophageal wall thickening with surrounding edema most compatible with severe esophagitis. Mild distention of the stomach with air and fluid. Trace right pleural effusion. Hepatic steatosis Electronically Signed   By: Charlett Nose M.D.   On: 04/05/2023 23:05    Procedures Procedures    Medications Ordered in ED Medications  LORazepam (ATIVAN) tablet 1-4 mg (has no administration in time range)    Or  LORazepam (ATIVAN) injection 1-4 mg (has no administration in time range)  thiamine (VITAMIN B1) tablet 100 mg ( Oral See Alternative 04/05/23 2124)    Or  thiamine (VITAMIN B1)  injection 100 mg (100 mg Intravenous Given 04/05/23 2124)  folic acid (FOLVITE) tablet 1 mg (has no administration in time range)  multivitamin with minerals tablet 1 tablet (has no administration in time range)  ondansetron (ZOFRAN) injection 4 mg (has no administration in time range)  sodium chloride 0.9 % bolus 1,000 mL (has no administration in time range)  acetaminophen (TYLENOL) tablet 650 mg (has no administration in time range)    Or  acetaminophen (TYLENOL) suppository 650 mg (has no administration in time range)  ondansetron (ZOFRAN) injection 4 mg (has no administration in time range)  ondansetron (ZOFRAN) injection 4 mg (4 mg Intravenous Given 04/05/23 2000)  0.9 %  sodium chloride infusion (0 mLs Intravenous Stopped 04/05/23 2304)  morphine (PF) 4 MG/ML injection 4 mg (4 mg Intravenous Given 04/05/23 2121)  pantoprazole (PROTONIX) injection 40 mg (40 mg Intravenous Given  04/05/23 2123)  iohexol (OMNIPAQUE) 350 MG/ML injection 75 mL (75 mLs Intravenous Contrast Given 04/05/23 2255)    ED Course/ Medical Decision Making/ A&P                             Medical Decision Making Amount and/or Complexity of Data Reviewed Labs: ordered. Radiology: ordered.  Risk OTC drugs. Prescription drug management.    This patient presents to the ED for concern of hematemesis, this involves a number of treatment options, and is a complaint that carries with it a high risk of complications and morbidity.  The differential diagnosis includes Boerhaave's, Mallory-Weiss tear versus upper GI bleed.   Co morbidities: Discussed in HPI   Brief History:  See HPI.  EMR reviewed including pt PMHx, past surgical history and past visits to ER.   See HPI for more details   Lab Tests:  I ordered and independently interpreted labs.  The pertinent results include:    CBC with slight leukocytosis, hemoglobin is normal. CMP with some decreased sodium, does have a history of alcohol use.  Glucose level slightly elevated.  His creatinine is elevated from his baseline, remarkable for an AKI.  LFTs are now worsened with AST at 133, ALT at 86, there is no significant tenderness to the right upper quadrant.  More so along the epigastric region.  Lipase level is also elevated at 54.   Imaging Studies:  CT Chest abdomen pelvis: IMPRESSION:  Severe diffuse esophageal wall thickening with surrounding edema  most compatible with severe esophagitis.    Mild distention of the stomach with air and fluid.    Trace right pleural effusion.   Cardiac Monitoring:  N/A  Medicines ordered:  I ordered medication including protonix, zofran, bolus  for symptomatic treatment.  Reevaluation of the patient after these medicines showed that the patient stayed the same I have reviewed the patients home medicines and have made adjustments as needed  Reevaluation:  After the interventions  noted above I re-evaluated patient and found that they have :stayed the same   Social Determinants of Health:  The patient's social determinants of health were a factor in the care of this patient  Problem List / ED Course:  Patient with a past medical history of alcohol use disorder, presents to the ED with a chief complaint of hematemesis which started earlier today.  Arrived tachycardic with a heart rate in the 120s, has not been able to tolerate any oral intake.  He denies any recent alcohol use, however recent admission in the month of May for  alcohol disorder.  Here his labs are remarkable for slight leukocytosis.  CMP remarkable for new AKI with a creatinine of 1.4, given a 2 L bolus without much improvement in his symptoms.  His LFTs are also elevated but there is no focal right upper quadrant tenderness, pain seems to be more so epigastric and generalized.  He does report hematemesis that is been ongoing for the past 24 hours.  Some suspicion for Boerhaave's versus Mallory-Weiss tear.  CT chest abdomen and pelvis did not show any active bleed at this time.  He does have some esophagitis.  Despite multiple rounds of Zofran, morphine, Protonix patient has continued to vomit while in the emergency department.  He has put out approximately 3 large bags of fluid.  Given a second liter, I have asked several times for charge nurse to move patient to a room, however due to our lack of capacity at this time patient has remained in the hallway bed without any monitoring.  He does not appear safe for disposition home at this time.  I will call hospitalist service in order to admit patient for his intractable vomiting, esophagitis. Spoke to Dr. Arlean Hopping, hospitalist service who agrees on admission at this time.  I will also secure chat gastroenterology in order to evaluate patient in the morning.  He is hemodynamically stable.    Dispostion:  After consideration of the diagnostic results and the patients  response to treatment, I feel that the patent would benefit from admission for further management.    Portions of this note were generated with Scientist, clinical (histocompatibility and immunogenetics). Dictation errors may occur despite best attempts at proofreading.   Final Clinical Impression(s) / ED Diagnoses Final diagnoses:  Epigastric pain  Nausea and vomiting, unspecified vomiting type    Rx / DC Orders ED Discharge Orders     None         Claude Manges, PA-C 04/06/23 0006    Virgina Norfolk, DO 04/06/23 1506

## 2023-04-05 NOTE — ED Provider Notes (Incomplete)
[place] EMERGENCY DEPARTMENT AT [facility] Provider Note   CSN: [id number] Arrival date & time: 04/05/23  1944     History  Chief Complaint  Patient presents with  . Abdominal Pain    Xavier Ramsey is a 32 y.o. male.  32 year old male with a past medical history of alcohol abuse, mental health presents to the ED with a chief complaint of abdominal pain, nausea vomiting since earlier this morning.  Endorsing generalized abdominal pain, had multiple episodes of bloody emesis, reported this was likely dark red.  Has not had a prior history of esophageal rupture.  He was given fentanyl by EMS with some improvement in symptoms.  Not actively vomiting on arrival.  Aggravated with any oral intake, no alleviating factors.  Currently not anticoagulated, no fevers, no blood in his stool. Also endorsing chest pain every time he vomits.  The history is provided by the patient.  Abdominal Pain Associated symptoms: nausea and vomiting   Associated symptoms: no chest pain, no chills, no fever, no shortness of breath and no sore throat        Home Medications Prior to Admission medications   Medication Sig Start Date End Date Taking? Authorizing Provider  gabapentin (NEURONTIN) 100 MG capsule Take 2 capsules (200 mg total) by mouth 2 (two) times daily. 01/20/23 02/19/23  Park Pope, MD  melatonin 5 MG TABS Take 1 tablet (5 mg total) by mouth at bedtime. 01/20/23   Park Pope, MD  traZODone (DESYREL) 100 MG tablet Take 1 tablet (100 mg total) by mouth at bedtime as needed for sleep. 01/20/23   Park Pope, MD      Allergies    Patient has no known allergies.    Review of Systems   Review of Systems  Constitutional:  Negative for chills and fever.  HENT:  Negative for sore throat.   Respiratory:  Negative for shortness of breath.   Cardiovascular:  Negative for chest pain.  Gastrointestinal:  Positive for abdominal pain, nausea and vomiting.  Genitourinary:  Negative for flank  pain.  Musculoskeletal:  Negative for back pain.  Skin:  Negative for pallor and wound.  All other systems reviewed and are negative.   Physical Exam Updated Vital Signs BP (!) 147/92 (BP Location: Left Arm)   Pulse (!) 124   Temp 98 F (36.7 C) (Oral)   Resp 20   SpO2 100%  Physical Exam Vitals and nursing note reviewed.  Constitutional:      Appearance: He is well-developed.  HENT:     Head: Normocephalic and atraumatic.  Eyes:     General: No scleral icterus.    Pupils: Pupils are equal, round, and reactive to light.  Cardiovascular:     Rate and Rhythm: Tachycardia present.     Heart sounds: Normal heart sounds.  Pulmonary:     Effort: Pulmonary effort is normal.     Breath sounds: Normal breath sounds. No wheezing.  Chest:     Chest wall: No tenderness.  Abdominal:     General: Abdomen is flat. Bowel sounds are normal. There is no distension.     Palpations: Abdomen is soft.     Tenderness: There is generalized abdominal tenderness.  Musculoskeletal:        General: No tenderness or deformity.     Cervical back: Normal range of motion.  Skin:    General: Skin is warm and dry.  Neurological:     Mental Status: He is alert and  oriented to person, place, and time.     ED Results / Procedures / Treatments   Labs (all labs ordered are listed, but only abnormal results are displayed) Labs Reviewed  LIPASE, BLOOD - Abnormal; Notable for the following components:      Result Value   Lipase 54 (*)    All other components within normal limits  COMPREHENSIVE METABOLIC PANEL - Abnormal; Notable for the following components:   Sodium 133 (*)    Chloride 91 (*)    CO2 <7 (*)    Glucose, Bld 222 (*)    Creatinine, Ser 1.44 (*)    Calcium 8.6 (*)    AST 133 (*)    ALT 86 (*)    All other components within normal limits  CBC - Abnormal; Notable for the following components:   WBC 11.2 (*)    All other components within normal limits  ETHANOL - Abnormal; Notable  for the following components:   Alcohol, Ethyl (B) 47 (*)    All other components within normal limits  SALICYLATE LEVEL - Abnormal; Notable for the following components:   Salicylate Lvl <7.0 (*)    All other components within normal limits  ACETAMINOPHEN LEVEL - Abnormal; Notable for the following components:   Acetaminophen (Tylenol), Serum <10 (*)    All other components within normal limits  URINALYSIS, ROUTINE W REFLEX MICROSCOPIC  TYPE AND SCREEN  ABO/RH    EKG None  Radiology CT CHEST ABDOMEN PELVIS W CONTRAST  Result Date: 04/05/2023 CLINICAL DATA:  Varicose veins EXAM: CT CHEST, ABDOMEN, AND PELVIS WITH CONTRAST TECHNIQUE: Multidetector CT imaging of the chest, abdomen and pelvis was performed following the standard protocol during bolus administration of intravenous contrast. RADIATION DOSE REDUCTION: This exam was performed according to the departmental dose-optimization program which includes automated exposure control, adjustment of the mA and/or kV according to patient size and/or use of iterative reconstruction technique. CONTRAST:  75mL OMNIPAQUE IOHEXOL 350 MG/ML SOLN COMPARISON:  None Available. FINDINGS: CT CHEST FINDINGS Cardiovascular: Heart is normal size. Aorta is normal caliber. Mediastinum/Nodes: No mediastinal, hilar, or axillary adenopathy. Trachea and thyroid unremarkable. Diffusely thickened esophagus with paraesophageal edema/stranding most compatible with severe esophagitis. Lungs/Pleura: Lungs are clear. No focal airspace opacities or suspicious nodules. Trace right pleural effusion. Musculoskeletal: Chest wall soft tissues are unremarkable. No acute bony abnormality. CT ABDOMEN PELVIS FINDINGS Hepatobiliary: Diffuse low-density throughout the liver compatible with fatty infiltration. No focal abnormality. Gallbladder unremarkable. Pancreas: No focal abnormality or ductal dilatation. Spleen: No focal abnormality.  Normal size. Adrenals/Urinary Tract: No adrenal  abnormality. No focal renal abnormality. No stones or hydronephrosis. Urinary bladder is unremarkable. Stomach/Bowel: Stomach is distended with fluid and gas. Large and small bowel decompressed, unremarkable. Normal appendix. Vascular/Lymphatic: No evidence of aneurysm or adenopathy. Venous structures widely patent. Reproductive: No visible focal abnormality. Other: No free fluid or free air. Musculoskeletal: No acute bony abnormality. IMPRESSION: Severe diffuse esophageal wall thickening with surrounding edema most compatible with severe esophagitis. Mild distention of the stomach with air and fluid. Trace right pleural effusion. Hepatic steatosis Electronically Signed   By: Charlett Nose M.D.   On: 04/05/2023 23:05    Procedures Procedures    Medications Ordered in ED Medications  fentaNYL (SUBLIMAZE) injection 50 mcg (50 mcg Intravenous Given 04/05/23 1958)  LORazepam (ATIVAN) tablet 1-4 mg (has no administration in time range)    Or  LORazepam (ATIVAN) injection 1-4 mg (has no administration in time range)  thiamine (VITAMIN B1) tablet 100  mg ( Oral See Alternative 04/05/23 2124)    Or  thiamine (VITAMIN B1) injection 100 mg (100 mg Intravenous Given 04/05/23 2124)  folic acid (FOLVITE) tablet 1 mg (has no administration in time range)  multivitamin with minerals tablet 1 tablet (has no administration in time range)  ondansetron (ZOFRAN) injection 4 mg (has no administration in time range)  pantoprazole (PROTONIX) injection 40 mg (has no administration in time range)  sodium chloride 0.9 % bolus 1,000 mL (has no administration in time range)  ondansetron (ZOFRAN) injection 4 mg (4 mg Intravenous Given 04/05/23 2000)  0.9 %  sodium chloride infusion (0 mLs Intravenous Stopped 04/05/23 2304)  morphine (PF) 4 MG/ML injection 4 mg (4 mg Intravenous Given 04/05/23 2121)  pantoprazole (PROTONIX) injection 40 mg (40 mg Intravenous Given 04/05/23 2123)  iohexol (OMNIPAQUE) 350 MG/ML injection 75 mL (75  mLs Intravenous Contrast Given 04/05/23 2255)    ED Course/ Medical Decision Making/ A&P                             Medical Decision Making Amount and/or Complexity of Data Reviewed Labs: ordered. Radiology: ordered.  Risk OTC drugs. Prescription drug management.    This patient presents to the ED for concern of hematemesis, this involves a number of treatment options, and is a complaint that carries with it a high risk of complications and morbidity.  The differential diagnosis includes Boerhaave's, Mallory-Weiss tear versus upper GI bleed.   Co morbidities: Discussed in HPI   Brief History:  See HPI.  EMR reviewed including pt PMHx, past surgical history and past visits to ER.   See HPI for more details   Lab Tests:  I ordered and independently interpreted labs.  The pertinent results include:    CBC with slight leukocytosis, hemoglobin is normal. CMP with some decreased sodium, does have a history of alcohol use.  Glucose level slightly elevated.  His creatinine is elevated from his baseline, remarkable for an AKI.  LFTs are now worsened with AST at 133, ALT at 86, there is no significant tenderness to the right upper quadrant.  More so along the epigastric region.  Lipase level is also elevated at 54.   Imaging Studies:  CT Chest abdomen pelvis: IMPRESSION:  Severe diffuse esophageal wall thickening with surrounding edema  most compatible with severe esophagitis.    Mild distention of the stomach with air and fluid.    Trace right pleural effusion.   Cardiac Monitoring:  N/A  Medicines ordered:  I ordered medication including protonix, zofran, bolus  for symptomatic treatment.  Reevaluation of the patient after these medicines showed that the patient stayed the same I have reviewed the patients home medicines and have made adjustments as needed  Reevaluation:  After the interventions noted above I re-evaluated patient and found that they have  :stayed the same   Social Determinants of Health:  .The patient's social determinants of health were a factor in the care of this patient  Problem List / ED Course:  Patient with a past medical history of alcohol use disorder, presents to the ED with a chief complaint of hematemesis which started earlier today.  Arrived tachycardic with a heart rate in the 120s, has not been able to tolerate any oral intake.  He denies any recent alcohol use, however recent admission in the month of May for alcohol disorder.  Here his labs are remarkable for slight leukocytosis.  CMP  remarkable for new AKI with a creatinine of 1.4, given a 2 L bolus without much improvement in his symptoms.  His LFTs are also elevated but there is no focal right upper quadrant tenderness, pain seems to be more so epigastric and generalized.  He does report hematemesis that is been ongoing for the past 24 hours.  Some suspicion for Boerhaave's versus Mallory-Weiss tear.  CT chest abdomen and pelvis did not show any active bleed at this time.  He does have some esophagitis.  Despite multiple rounds of Zofran, morphine, Protonix patient has continued to vomit while in the emergency department.  He has put out approximately 3 large bags of fluid.  Given a second liter, I have asked several times for charge nurse to move patient to a room, however due to our lack of capacity at this time patient has remained in the hallway bed without any monitoring.  He does not appear safe for disposition home at this time.  I will call hospitalist service in order to admit patient for his intractable vomiting, esophagitis. Spoke to Dr. Arlean Hopping, hospitalist service who agrees on admission at this time.  I will also secure chat gastroenterology in order to evaluate patient in the morning.  He is hemodynamically stable.   Dispostion:  After consideration of the diagnostic results and the patients response to treatment, I feel that the patent would benefit  from admission for further management.    Portions of this note were generated with Scientist, clinical (histocompatibility and immunogenetics). Dictation errors may occur despite best attempts at proofreading.   Final Clinical Impression(s) / ED Diagnoses Final diagnoses:  Epigastric pain  Nausea and vomiting, unspecified vomiting type    Rx / DC Orders ED Discharge Orders     None

## 2023-04-05 NOTE — ED Notes (Signed)
Pt actively vomiting and uncomfortable

## 2023-04-05 NOTE — Progress Notes (Signed)
TOC consulted for substance abuse resources. Resources provided and attached to AVS.

## 2023-04-05 NOTE — ED Triage Notes (Signed)
Pt arrived with EMS for abd pain since 7am with NV. Pain generalized, worsened in upper quadrants. EMS gave ~700 LR and 4mg  zofran

## 2023-04-06 ENCOUNTER — Encounter (HOSPITAL_COMMUNITY): Payer: Self-pay | Admitting: Internal Medicine

## 2023-04-06 DIAGNOSIS — Z79899 Other long term (current) drug therapy: Secondary | ICD-10-CM | POA: Diagnosis not present

## 2023-04-06 DIAGNOSIS — D72829 Elevated white blood cell count, unspecified: Secondary | ICD-10-CM

## 2023-04-06 DIAGNOSIS — Z8249 Family history of ischemic heart disease and other diseases of the circulatory system: Secondary | ICD-10-CM | POA: Diagnosis not present

## 2023-04-06 DIAGNOSIS — K922 Gastrointestinal hemorrhage, unspecified: Secondary | ICD-10-CM | POA: Diagnosis not present

## 2023-04-06 DIAGNOSIS — R112 Nausea with vomiting, unspecified: Secondary | ICD-10-CM | POA: Diagnosis present

## 2023-04-06 DIAGNOSIS — K760 Fatty (change of) liver, not elsewhere classified: Secondary | ICD-10-CM | POA: Diagnosis present

## 2023-04-06 DIAGNOSIS — I10 Essential (primary) hypertension: Secondary | ICD-10-CM | POA: Diagnosis present

## 2023-04-06 DIAGNOSIS — R634 Abnormal weight loss: Secondary | ICD-10-CM | POA: Diagnosis present

## 2023-04-06 DIAGNOSIS — K449 Diaphragmatic hernia without obstruction or gangrene: Secondary | ICD-10-CM | POA: Diagnosis present

## 2023-04-06 DIAGNOSIS — K2101 Gastro-esophageal reflux disease with esophagitis, with bleeding: Secondary | ICD-10-CM | POA: Diagnosis present

## 2023-04-06 DIAGNOSIS — K920 Hematemesis: Secondary | ICD-10-CM | POA: Diagnosis present

## 2023-04-06 DIAGNOSIS — K700 Alcoholic fatty liver: Secondary | ICD-10-CM | POA: Diagnosis not present

## 2023-04-06 DIAGNOSIS — R1013 Epigastric pain: Secondary | ICD-10-CM

## 2023-04-06 DIAGNOSIS — K209 Esophagitis, unspecified without bleeding: Secondary | ICD-10-CM

## 2023-04-06 DIAGNOSIS — N179 Acute kidney failure, unspecified: Secondary | ICD-10-CM | POA: Diagnosis present

## 2023-04-06 DIAGNOSIS — F1010 Alcohol abuse, uncomplicated: Secondary | ICD-10-CM | POA: Diagnosis present

## 2023-04-06 DIAGNOSIS — F17210 Nicotine dependence, cigarettes, uncomplicated: Secondary | ICD-10-CM | POA: Diagnosis present

## 2023-04-06 DIAGNOSIS — R7401 Elevation of levels of liver transaminase levels: Secondary | ICD-10-CM | POA: Diagnosis present

## 2023-04-06 DIAGNOSIS — Z720 Tobacco use: Secondary | ICD-10-CM

## 2023-04-06 DIAGNOSIS — F32A Depression, unspecified: Secondary | ICD-10-CM | POA: Diagnosis present

## 2023-04-06 DIAGNOSIS — Z6824 Body mass index (BMI) 24.0-24.9, adult: Secondary | ICD-10-CM | POA: Diagnosis not present

## 2023-04-06 DIAGNOSIS — E860 Dehydration: Secondary | ICD-10-CM | POA: Diagnosis present

## 2023-04-06 DIAGNOSIS — F419 Anxiety disorder, unspecified: Secondary | ICD-10-CM | POA: Diagnosis present

## 2023-04-06 DIAGNOSIS — K2920 Alcoholic gastritis without bleeding: Secondary | ICD-10-CM | POA: Diagnosis not present

## 2023-04-06 DIAGNOSIS — K2921 Alcoholic gastritis with bleeding: Secondary | ICD-10-CM | POA: Diagnosis present

## 2023-04-06 LAB — COMPREHENSIVE METABOLIC PANEL
ALT: 66 U/L — ABNORMAL HIGH (ref 0–44)
AST: 82 U/L — ABNORMAL HIGH (ref 15–41)
Albumin: 3.6 g/dL (ref 3.5–5.0)
Alkaline Phosphatase: 61 U/L (ref 38–126)
Anion gap: 12 (ref 5–15)
BUN: 8 mg/dL (ref 6–20)
CO2: 24 mmol/L (ref 22–32)
Calcium: 8.9 mg/dL (ref 8.9–10.3)
Chloride: 98 mmol/L (ref 98–111)
Creatinine, Ser: 1.13 mg/dL (ref 0.61–1.24)
GFR, Estimated: >60 mL/min (ref >60)
Glucose, Bld: 119 mg/dL — ABNORMAL HIGH (ref 70–99)
Potassium: 3.7 mmol/L (ref 3.5–5.1)
Sodium: 134 mmol/L — ABNORMAL LOW (ref 135–145)
Total Bilirubin: 2.9 mg/dL — ABNORMAL HIGH (ref 0.3–1.2)
Total Protein: 6.2 g/dL — ABNORMAL LOW (ref 6.5–8.1)

## 2023-04-06 LAB — TYPE AND SCREEN
ABO/RH(D): O POS
Antibody Screen: NEGATIVE

## 2023-04-06 LAB — CBC WITH DIFFERENTIAL/PLATELET
Abs Immature Granulocytes: 0.02 10*3/uL (ref 0.00–0.07)
Basophils Absolute: 0 10*3/uL (ref 0.0–0.1)
Basophils Relative: 0 %
Eosinophils Absolute: 0 10*3/uL (ref 0.0–0.5)
Eosinophils Relative: 0 %
HCT: 34.8 % — ABNORMAL LOW (ref 39.0–52.0)
Hemoglobin: 12.6 g/dL — ABNORMAL LOW (ref 13.0–17.0)
Immature Granulocytes: 0 %
Lymphocytes Relative: 18 %
Lymphs Abs: 1.1 10*3/uL (ref 0.7–4.0)
MCH: 33.2 pg (ref 26.0–34.0)
MCHC: 36.2 g/dL — ABNORMAL HIGH (ref 30.0–36.0)
MCV: 91.6 fL (ref 80.0–100.0)
Monocytes Absolute: 0.6 10*3/uL (ref 0.1–1.0)
Monocytes Relative: 11 %
Neutro Abs: 4.2 10*3/uL (ref 1.7–7.7)
Neutrophils Relative %: 71 %
Platelets: 235 10*3/uL (ref 150–400)
RBC: 3.8 MIL/uL — ABNORMAL LOW (ref 4.22–5.81)
RDW: 14.1 % (ref 11.5–15.5)
WBC: 5.9 10*3/uL (ref 4.0–10.5)
nRBC: 0 % (ref 0.0–0.2)

## 2023-04-06 LAB — APTT: aPTT: 23 seconds — ABNORMAL LOW (ref 24–36)

## 2023-04-06 LAB — HEMOGLOBIN AND HEMATOCRIT, BLOOD
HCT: 34.6 % — ABNORMAL LOW (ref 39.0–52.0)
Hemoglobin: 12.3 g/dL — ABNORMAL LOW (ref 13.0–17.0)

## 2023-04-06 LAB — PROTIME-INR
INR: 1 (ref 0.8–1.2)
Prothrombin Time: 13.7 seconds (ref 11.4–15.2)

## 2023-04-06 LAB — MAGNESIUM: Magnesium: 1.8 mg/dL (ref 1.7–2.4)

## 2023-04-06 LAB — PHOSPHORUS: Phosphorus: 2.7 mg/dL (ref 2.5–4.6)

## 2023-04-06 LAB — ABO/RH: ABO/RH(D): O POS

## 2023-04-06 MED ORDER — SODIUM CHLORIDE 0.9 % IV SOLN: 12.5000 mg | Freq: Four times a day (QID) | INTRAVENOUS | Status: DC | PRN

## 2023-04-06 MED ORDER — FENTANYL CITRATE PF 50 MCG/ML IJ SOSY
50.0000 ug | PREFILLED_SYRINGE | INTRAMUSCULAR | Status: DC | PRN
  Administered 2023-04-06 – 2023-04-07 (×9): 50 ug via INTRAVENOUS
  Filled 2023-04-06 (×10): qty 1

## 2023-04-06 MED ORDER — SODIUM CHLORIDE 0.9 % IV SOLN: INTRAVENOUS | Status: DC

## 2023-04-06 MED ORDER — ACETAMINOPHEN 650 MG RE SUPP: 650.0000 mg | Freq: Four times a day (QID) | RECTAL | Status: DC | PRN

## 2023-04-06 MED ORDER — ACETAMINOPHEN 325 MG PO TABS
650.0000 mg | ORAL_TABLET | Freq: Four times a day (QID) | ORAL | Status: DC | PRN
  Administered 2023-04-06 (×2): 650 mg via ORAL
  Filled 2023-04-06 (×2): qty 2

## 2023-04-06 MED ORDER — NICOTINE 14 MG/24HR TD PT24: 14.0000 mg | MEDICATED_PATCH | Freq: Every day | TRANSDERMAL | Status: DC | PRN

## 2023-04-06 MED ORDER — ONDANSETRON HCL 4 MG/2ML IJ SOLN
4.0000 mg | Freq: Four times a day (QID) | INTRAMUSCULAR | Status: DC | PRN
  Administered 2023-04-06 (×2): 4 mg via INTRAVENOUS
  Filled 2023-04-06 (×2): qty 2

## 2023-04-06 MED ORDER — LACTATED RINGERS IV SOLN: INTRAVENOUS | Status: AC

## 2023-04-06 MED ORDER — NALOXONE HCL 0.4 MG/ML IJ SOLN: 0.4000 mg | INTRAMUSCULAR | Status: DC | PRN

## 2023-04-06 MED ORDER — PANTOPRAZOLE INFUSION (NEW) - SIMPLE MED
8.0000 mg/h | INTRAVENOUS | Status: DC
  Administered 2023-04-06 – 2023-04-07 (×4): 8 mg/h via INTRAVENOUS
  Filled 2023-04-06 (×5): qty 100

## 2023-04-06 MED ORDER — PROMETHAZINE (PHENERGAN) 6.25MG IN NS 50ML IVPB: 6.2500 mg | Freq: Four times a day (QID) | INTRAVENOUS | Status: DC | PRN

## 2023-04-06 NOTE — H&P (View-Only) (Signed)
Consultation  Referring Provider:  [facility]  Primary Care Physician:  Patient, No Pcp Per Primary Gastroenterologist:  Unassigned       Reason for Consultation:     Hematemesis  LOS: 0 days          HPI:   Xavier Ramsey is a 32 y.o. male with past medical history significant for chronic alcohol abuse presents for evaluation of upper GI bleed.  Patient states over the last 3 days he has had recurrent nausea and vomiting.  States his emesis has been nonbloody until yesterday when he had severe retching and then began vomiting bright red blood in addition to recent coffee-ground emesis which prompted his visit to the emergency department.  He also reports associated epigastric discomfort and GERD.  Denies fever/chills.  Denies melena/hematochezia.  Reports 10 to 15 pound weight loss over the last month that was unintentional.  Reports decreased appetite.  Denies NSAID use.  Denies tobacco use.  Was previously drinking liquor daily until he was admitted to rehab in May.  Since rehab he reports drinking wine 2 times a week.  Denies previous EGD/colonoscopy.  Upon arrival to ED initial heart rate in 120s with improvement after IVF.  BP in the 140s.  Hgb 15.1 Leukocytosis with WBC 11.2 BUN 13, creatinine 1.44 AST 133/ALT 86/alk phos 86 T. bili 1.0 Lipase 54 Ethanol 47  CT chest abdomen pelvis with contrast shows severe diffuse esophageal wall thickening with surrounding edema most compatible with severe esophagitis.  Mild distention of stomach with air and fluid.  Trace right pleural effusion.  Hepatic steatosis.  Gallbladder unremarkable.   Past Medical History:  Diagnosis Date  . Anxiety    [employer] 10/21/2020  . Depression   . Substance abuse (HCC)    [employer] 10/21/2020    Surgical History:  He  has no past surgical history on file. Family History:  His family history includes Hypertension in his mother. Social History:   reports that he has been smoking cigarettes.  He has never used smokeless tobacco. He reports current alcohol use of about 2.0 standard drinks of alcohol per week. He reports that he does not currently use drugs after having used the following drugs: Marijuana.  Prior to Admission medications   Not on File    Current Facility-Administered Medications  Medication Dose Route Frequency Provider Last Rate Last Admin  . acetaminophen (TYLENOL) tablet 650 mg  650 mg Oral Q6H PRN Howerter, Justin B, DO   650 mg at 04/06/23 0859   Or  . acetaminophen (TYLENOL) suppository 650 mg  650 mg Rectal Q6H PRN Howerter, Justin B, DO      . fentaNYL (SUBLIMAZE) injection 50 mcg  50 mcg Intravenous Q2H PRN Howerter, Justin B, DO   50 mcg at 04/06/23 0859  . folic acid (FOLVITE) tablet 1 mg  1 mg Oral Daily Soto, Johana, PA-C   1 mg at 04/06/23 0855  . lactated ringers infusion   Intravenous Continuous Howerter, Justin B, DO 75 mL/hr at 04/06/23 0032 New Bag at 04/06/23 0032  . LORazepam (ATIVAN) tablet 1-4 mg  1-4 mg Oral Q1H PRN Claude Manges, PA-C   1 mg at 04/06/23 0913   Or  . LORazepam (ATIVAN) injection 1-4 mg  1-4 mg Intravenous Q1H PRN Claude Manges, PA-C   1 mg at 04/06/23 0318  . multivitamin with minerals tablet 1 tablet  1 tablet Oral Daily Soto, Johana, PA-C   1 tablet at  04/06/23 0855  . naloxone (NARCAN) injection 0.4 mg  0.4 mg Intravenous PRN Howerter, Justin B, DO      . nicotine (NICODERM CQ - dosed in mg/24 hours) patch 14 mg  14 mg Transdermal Daily PRN Howerter, Justin B, DO      . ondansetron (ZOFRAN) injection 4 mg  4 mg Intravenous Q6H PRN Howerter, Justin B, DO   4 mg at 04/06/23 0315  . pantoprozole (PROTONIX) 80 mg /NS 100 mL infusion  8 mg/hr Intravenous Continuous Howerter, Justin B, DO 10 mL/hr at 04/06/23 0456 8 mg/hr at 04/06/23 0456  . promethazine (PHENERGAN) 12.5 mg in sodium chloride 0.9 % 50 mL IVPB  12.5 mg Intravenous Q6H PRN Howerter, Justin B, DO      . thiamine (VITAMIN B1) tablet 100 mg  100 mg Oral Daily Soto,  Johana, PA-C       Or  . thiamine (VITAMIN B1) injection 100 mg  100 mg Intravenous Daily Soto, Johana, PA-C   100 mg at 04/06/23 0854    Allergies as of 04/05/2023  . (No Known Allergies)    Review of Systems  Constitutional:  Positive for weight loss. Negative for chills and fever.  HENT:  Negative for hearing loss and tinnitus.   Eyes:  Negative for blurred vision and double vision.  Respiratory:  Negative for cough and hemoptysis.   Cardiovascular:  Negative for chest pain and palpitations.  Gastrointestinal:  Positive for abdominal pain, heartburn, nausea and vomiting. Negative for blood in stool, constipation, diarrhea and melena.  Genitourinary:  Negative for dysuria and urgency.  Musculoskeletal:  Negative for myalgias and neck pain.  Skin:  Negative for itching and rash.  Neurological:  Negative for seizures and loss of consciousness.  Psychiatric/Behavioral:  Negative for depression and suicidal ideas.        Physical Exam:  Vital signs in last 24 hours: Temp:  [98 F (36.7 C)-98.6 F (37 C)] 98.2 F (36.8 C) (07/25 0751) Pulse Rate:  [88-124] 88 (07/25 0751) Resp:  [17-21] 17 (07/25 0751) BP: (138-155)/(87-102) 138/87 (07/25 0751) SpO2:  [100 %] 100 % (07/25 0751) Weight:  [72.7 kg] 72.7 kg (07/25 0254) Last BM Date : 04/04/23 Last BM recorded by nurses in past 5 days No data recorded  Physical Exam Constitutional:      Appearance: He is well-developed.  HENT:     Head: Normocephalic and atraumatic.     Nose: Nose normal. No congestion.     Mouth/Throat:     Pharynx: Oropharynx is clear.  Eyes:     General: No scleral icterus.    Extraocular Movements: Extraocular movements intact.  Cardiovascular:     Rate and Rhythm: Normal rate and regular rhythm.  Pulmonary:     Effort: Pulmonary effort is normal. No respiratory distress.  Abdominal:     General: Abdomen is flat. Bowel sounds are normal. There is no distension.     Palpations: Abdomen is soft.  There is no mass.     Tenderness: There is abdominal tenderness (Epigastric). There is no guarding or rebound.     Hernia: No hernia is present.  Musculoskeletal:        General: No swelling. Normal range of motion.     Cervical back: Normal range of motion and neck supple.  Skin:    General: Skin is warm and dry.     Coloration: Skin is not jaundiced.  Neurological:     General: No focal deficit present.  Mental Status: He is oriented to person, place, and time.  Psychiatric:        Mood and Affect: Mood normal.        Thought Content: Thought content normal.        Judgment: Judgment normal.      LAB RESULTS: Recent Labs    04/05/23 2000  WBC 11.2*  HGB 15.1  HCT 44.8  PLT 340   BMET Recent Labs    04/05/23 2000  NA 133*  K 5.1  CL 91*  CO2 <7*  GLUCOSE 222*  BUN 13  CREATININE 1.44*  CALCIUM 8.6*   LFT Recent Labs    04/05/23 2000  PROT 7.8  ALBUMIN 4.4  AST 133*  ALT 86*  ALKPHOS 86  BILITOT 1.0   PT/INR No results for input(s): "LABPROT", "INR" in the last 72 hours.  STUDIES: CT CHEST ABDOMEN PELVIS W CONTRAST  Result Date: 04/05/2023 CLINICAL DATA:  Varicose veins EXAM: CT CHEST, ABDOMEN, AND PELVIS WITH CONTRAST TECHNIQUE: Multidetector CT imaging of the chest, abdomen and pelvis was performed following the standard protocol during bolus administration of intravenous contrast. RADIATION DOSE REDUCTION: This exam was performed according to the departmental dose-optimization program which includes automated exposure control, adjustment of the mA and/or kV according to patient size and/or use of iterative reconstruction technique. CONTRAST:  75mL OMNIPAQUE IOHEXOL 350 MG/ML SOLN COMPARISON:  None Available. FINDINGS: CT CHEST FINDINGS Cardiovascular: Heart is normal size. Aorta is normal caliber. Mediastinum/Nodes: No mediastinal, hilar, or axillary adenopathy. Trachea and thyroid unremarkable. Diffusely thickened esophagus with paraesophageal  edema/stranding most compatible with severe esophagitis. Lungs/Pleura: Lungs are clear. No focal airspace opacities or suspicious nodules. Trace right pleural effusion. Musculoskeletal: Chest wall soft tissues are unremarkable. No acute bony abnormality. CT ABDOMEN PELVIS FINDINGS Hepatobiliary: Diffuse low-density throughout the liver compatible with fatty infiltration. No focal abnormality. Gallbladder unremarkable. Pancreas: No focal abnormality or ductal dilatation. Spleen: No focal abnormality.  Normal size. Adrenals/Urinary Tract: No adrenal abnormality. No focal renal abnormality. No stones or hydronephrosis. Urinary bladder is unremarkable. Stomach/Bowel: Stomach is distended with fluid and gas. Large and small bowel decompressed, unremarkable. Normal appendix. Vascular/Lymphatic: No evidence of aneurysm or adenopathy. Venous structures widely patent. Reproductive: No visible focal abnormality. Other: No free fluid or free air. Musculoskeletal: No acute bony abnormality. IMPRESSION: Severe diffuse esophageal wall thickening with surrounding edema most compatible with severe esophagitis. Mild distention of the stomach with air and fluid. Trace right pleural effusion. Hepatic steatosis Electronically Signed   By: Charlett Nose M.D.   On: 04/05/2023 23:05      Impression    32 year old male history of chronic alcohol abuse presented with hematemesis after 3 days of persistent nausea and vomiting with intense retching with associated epigastric pain and CT scan concerning for severe esophagitis.  Acute upper GI bleed Vitals stable Hgb 15.1 Leukocytosis with WBC 11.2 BUN 13, creatinine 1.44 AST 133/ALT 86/alk phos 86 T. bili 1.0 Lipase 54 Ethanol 47 CT chest abdomen pelvis with contrast shows severe esophagitis and hepatic steatosis Patient is hemodynamically stable with no further bleeding at this time.  Suspect severe retching may have resulted in Mallory-Weiss tear/worsening of esophagitis.   Significant alcohol use contributing factor to his esophagitis.  Hepatic steatosis AST 133/ALT 86/alk phos 86 T. bili 1.0 Ethanol 47  Alcohol abuse   Plan   - EGD tomorrow pending anesthesia availability for further evaluation -I thoroughly discussed the procedure with the patient (at bedside) to include nature of the  procedure, alternatives, benefits, and risks (including but not limited to bleeding, infection, perforation, anesthesia/cardiac pulmonary complications).  Patient verbalized understanding and gave verbal consent to proceed with procedure.  - Continue PPI - Continue supportive care - Discussed importance of cessation of alcohol use especially with elevated LFTs and presence of hepatic steatosis. Discussed this can progress to Cirrhosis which can lead to an early death if alcohol is not completely stopped. - Continue daily CBC and transfuse as needed to maintain HGB > 7  - Clear liquid diet and NPO midnight  Thank you for your kind consultation, we will continue to follow.   Awa Bachicha Leanna Sato  04/06/2023, 9:24 AM

## 2023-04-06 NOTE — Consult Note (Addendum)
Consultation  Referring Provider:  [facility]  Primary Care Physician:  Patient, No Pcp Per Primary Gastroenterologist:  Unassigned       Reason for Consultation:     Hematemesis  LOS: 0 days          HPI:   Xavier Ramsey is a 32 y.o. male with past medical history significant for chronic alcohol abuse presents for evaluation of upper GI bleed.  Patient states over the last 3 days he has had recurrent nausea and vomiting.  States his emesis has been nonbloody until yesterday when he had severe retching and then began vomiting bright red blood in addition to recent coffee-ground emesis which prompted his visit to the emergency department.  He also reports associated epigastric discomfort and GERD.  Denies fever/chills.  Denies melena/hematochezia.  Reports 10 to 15 pound weight loss over the last month that was unintentional.  Reports decreased appetite.  Denies NSAID use.  Denies tobacco use.  Was previously drinking liquor daily until he was admitted to rehab in May.  Since rehab he reports drinking wine 2 times a week.  Denies previous EGD/colonoscopy.  Upon arrival to ED initial heart rate in 120s with improvement after IVF.  BP in the 140s.  Hgb 15.1 Leukocytosis with WBC 11.2 BUN 13, creatinine 1.44 AST 133/ALT 86/alk phos 86 T. bili 1.0 Lipase 54 Ethanol 47  CT chest abdomen pelvis with contrast shows severe diffuse esophageal wall thickening with surrounding edema most compatible with severe esophagitis.  Mild distention of stomach with air and fluid.  Trace right pleural effusion.  Hepatic steatosis.  Gallbladder unremarkable.   Past Medical History:  Diagnosis Date   Anxiety    Phreesia 10/21/2020   Depression    Substance abuse (HCC)    Phreesia 10/21/2020    Surgical History:  He  has no past surgical history on file. Family History:  His family history includes Hypertension in his mother. Social History:   reports that he has been smoking cigarettes. He  has never used smokeless tobacco. He reports current alcohol use of about 2.0 standard drinks of alcohol per week. He reports that he does not currently use drugs after having used the following drugs: Marijuana.  Prior to Admission medications   Not on File    Current Facility-Administered Medications  Medication Dose Route Frequency Provider Last Rate Last Admin   acetaminophen (TYLENOL) tablet 650 mg  650 mg Oral Q6H PRN Howerter, Justin B, DO   650 mg at 04/06/23 0859   Or   acetaminophen (TYLENOL) suppository 650 mg  650 mg Rectal Q6H PRN Howerter, Justin B, DO       fentaNYL (SUBLIMAZE) injection 50 mcg  50 mcg Intravenous Q2H PRN Howerter, Justin B, DO   50 mcg at 04/06/23 0859   folic acid (FOLVITE) tablet 1 mg  1 mg Oral Daily Soto, Johana, PA-C   1 mg at 04/06/23 0855   lactated ringers infusion   Intravenous Continuous Howerter, Justin B, DO 75 mL/hr at 04/06/23 0032 New Bag at 04/06/23 0032   LORazepam (ATIVAN) tablet 1-4 mg  1-4 mg Oral Q1H PRN Claude Manges, PA-C   1 mg at 04/06/23 0913   Or   LORazepam (ATIVAN) injection 1-4 mg  1-4 mg Intravenous Q1H PRN Claude Manges, PA-C   1 mg at 04/06/23 0318   multivitamin with minerals tablet 1 tablet  1 tablet Oral Daily Soto, Johana, PA-C   1 tablet at  04/06/23 0855   naloxone (NARCAN) injection 0.4 mg  0.4 mg Intravenous PRN Howerter, Justin B, DO       nicotine (NICODERM CQ - dosed in mg/24 hours) patch 14 mg  14 mg Transdermal Daily PRN Howerter, Justin B, DO       ondansetron (ZOFRAN) injection 4 mg  4 mg Intravenous Q6H PRN Howerter, Justin B, DO   4 mg at 04/06/23 0315   pantoprozole (PROTONIX) 80 mg /NS 100 mL infusion  8 mg/hr Intravenous Continuous Howerter, Justin B, DO 10 mL/hr at 04/06/23 0456 8 mg/hr at 04/06/23 0456   promethazine (PHENERGAN) 12.5 mg in sodium chloride 0.9 % 50 mL IVPB  12.5 mg Intravenous Q6H PRN Howerter, Justin B, DO       thiamine (VITAMIN B1) tablet 100 mg  100 mg Oral Daily Soto, Johana, PA-C        Or   thiamine (VITAMIN B1) injection 100 mg  100 mg Intravenous Daily Soto, Johana, PA-C   100 mg at 04/06/23 0854    Allergies as of 04/05/2023   (No Known Allergies)    Review of Systems  Constitutional:  Positive for weight loss. Negative for chills and fever.  HENT:  Negative for hearing loss and tinnitus.   Eyes:  Negative for blurred vision and double vision.  Respiratory:  Negative for cough and hemoptysis.   Cardiovascular:  Negative for chest pain and palpitations.  Gastrointestinal:  Positive for abdominal pain, heartburn, nausea and vomiting. Negative for blood in stool, constipation, diarrhea and melena.  Genitourinary:  Negative for dysuria and urgency.  Musculoskeletal:  Negative for myalgias and neck pain.  Skin:  Negative for itching and rash.  Neurological:  Negative for seizures and loss of consciousness.  Psychiatric/Behavioral:  Negative for depression and suicidal ideas.        Physical Exam:  Vital signs in last 24 hours: Temp:  [98 F (36.7 C)-98.6 F (37 C)] 98.2 F (36.8 C) (07/25 0751) Pulse Rate:  [88-124] 88 (07/25 0751) Resp:  [17-21] 17 (07/25 0751) BP: (138-155)/(87-102) 138/87 (07/25 0751) SpO2:  [100 %] 100 % (07/25 0751) Weight:  [72.7 kg] 72.7 kg (07/25 0254) Last BM Date : 04/04/23 Last BM recorded by nurses in past 5 days No data recorded  Physical Exam Constitutional:      Appearance: He is well-developed.  HENT:     Head: Normocephalic and atraumatic.     Nose: Nose normal. No congestion.     Mouth/Throat:     Pharynx: Oropharynx is clear.  Eyes:     General: No scleral icterus.    Extraocular Movements: Extraocular movements intact.  Cardiovascular:     Rate and Rhythm: Normal rate and regular rhythm.  Pulmonary:     Effort: Pulmonary effort is normal. No respiratory distress.  Abdominal:     General: Abdomen is flat. Bowel sounds are normal. There is no distension.     Palpations: Abdomen is soft. There is no mass.      Tenderness: There is abdominal tenderness (Epigastric). There is no guarding or rebound.     Hernia: No hernia is present.  Musculoskeletal:        General: No swelling. Normal range of motion.     Cervical back: Normal range of motion and neck supple.  Skin:    General: Skin is warm and dry.     Coloration: Skin is not jaundiced.  Neurological:     General: No focal deficit present.  Mental Status: He is oriented to person, place, and time.  Psychiatric:        Mood and Affect: Mood normal.        Thought Content: Thought content normal.        Judgment: Judgment normal.      LAB RESULTS: Recent Labs    04/05/23 2000  WBC 11.2*  HGB 15.1  HCT 44.8  PLT 340   BMET Recent Labs    04/05/23 2000  NA 133*  K 5.1  CL 91*  CO2 <7*  GLUCOSE 222*  BUN 13  CREATININE 1.44*  CALCIUM 8.6*   LFT Recent Labs    04/05/23 2000  PROT 7.8  ALBUMIN 4.4  AST 133*  ALT 86*  ALKPHOS 86  BILITOT 1.0   PT/INR No results for input(s): "LABPROT", "INR" in the last 72 hours.  STUDIES: CT CHEST ABDOMEN PELVIS W CONTRAST  Result Date: 04/05/2023 CLINICAL DATA:  Varicose veins EXAM: CT CHEST, ABDOMEN, AND PELVIS WITH CONTRAST TECHNIQUE: Multidetector CT imaging of the chest, abdomen and pelvis was performed following the standard protocol during bolus administration of intravenous contrast. RADIATION DOSE REDUCTION: This exam was performed according to the departmental dose-optimization program which includes automated exposure control, adjustment of the mA and/or kV according to patient size and/or use of iterative reconstruction technique. CONTRAST:  75mL OMNIPAQUE IOHEXOL 350 MG/ML SOLN COMPARISON:  None Available. FINDINGS: CT CHEST FINDINGS Cardiovascular: Heart is normal size. Aorta is normal caliber. Mediastinum/Nodes: No mediastinal, hilar, or axillary adenopathy. Trachea and thyroid unremarkable. Diffusely thickened esophagus with paraesophageal edema/stranding most compatible  with severe esophagitis. Lungs/Pleura: Lungs are clear. No focal airspace opacities or suspicious nodules. Trace right pleural effusion. Musculoskeletal: Chest wall soft tissues are unremarkable. No acute bony abnormality. CT ABDOMEN PELVIS FINDINGS Hepatobiliary: Diffuse low-density throughout the liver compatible with fatty infiltration. No focal abnormality. Gallbladder unremarkable. Pancreas: No focal abnormality or ductal dilatation. Spleen: No focal abnormality.  Normal size. Adrenals/Urinary Tract: No adrenal abnormality. No focal renal abnormality. No stones or hydronephrosis. Urinary bladder is unremarkable. Stomach/Bowel: Stomach is distended with fluid and gas. Large and small bowel decompressed, unremarkable. Normal appendix. Vascular/Lymphatic: No evidence of aneurysm or adenopathy. Venous structures widely patent. Reproductive: No visible focal abnormality. Other: No free fluid or free air. Musculoskeletal: No acute bony abnormality. IMPRESSION: Severe diffuse esophageal wall thickening with surrounding edema most compatible with severe esophagitis. Mild distention of the stomach with air and fluid. Trace right pleural effusion. Hepatic steatosis Electronically Signed   By: Charlett Nose M.D.   On: 04/05/2023 23:05      Impression    32 year old male history of chronic alcohol abuse presented with hematemesis after 3 days of persistent nausea and vomiting with intense retching with associated epigastric pain and CT scan concerning for severe esophagitis.  Acute upper GI bleed Vitals stable Hgb 15.1 Leukocytosis with WBC 11.2 BUN 13, creatinine 1.44 AST 133/ALT 86/alk phos 86 T. bili 1.0 Lipase 54 Ethanol 47 CT chest abdomen pelvis with contrast shows severe esophagitis and hepatic steatosis Patient is hemodynamically stable with no further bleeding at this time.  Suspect severe retching may have resulted in Mallory-Weiss tear/worsening of esophagitis.  Significant alcohol use  contributing factor to his esophagitis.  Hepatic steatosis AST 133/ALT 86/alk phos 86 T. bili 1.0 Ethanol 47  Alcohol abuse   Plan   - EGD tomorrow pending anesthesia availability for further evaluation -I thoroughly discussed the procedure with the patient (at bedside) to include nature of the  procedure, alternatives, benefits, and risks (including but not limited to bleeding, infection, perforation, anesthesia/cardiac pulmonary complications).  Patient verbalized understanding and gave verbal consent to proceed with procedure.  - Continue PPI - Continue supportive care - Discussed importance of cessation of alcohol use especially with elevated LFTs and presence of hepatic steatosis. Discussed this can progress to Cirrhosis which can lead to an early death if alcohol is not completely stopped. - Continue daily CBC and transfuse as needed to maintain HGB > 7  - Clear liquid diet and NPO midnight  Thank you for your kind consultation, we will continue to follow.   Moorea Boissonneault Leanna Sato  04/06/2023, 9:24 AM

## 2023-04-06 NOTE — ED Notes (Signed)
ED TO INPATIENT HANDOFF REPORT  ED Nurse Name and Phone #: Pearletha Forge RN   S Name/Age/Gender Xavier Ramsey 32 y.o. male Room/Bed: 016C/016C  Code Status   Code Status: Full Code  Home/SNF/Other Home Patient oriented to: self, place, time, and situation Is this baseline? Yes   Triage Complete: Triage complete  Chief Complaint Acute upper GI bleed [K92.2]  Triage Note Pt arrived with EMS for abd pain since 7am with NV. Pain generalized, worsened in upper quadrants. EMS gave ~700 LR and 4mg  zofran    Allergies No Known Allergies  Level of Care/Admitting Diagnosis ED Disposition     ED Disposition  Admit   Condition  --   Comment  Hospital Area: [facility] [[id number]]  Level of Care: Progressive [102]  Admit to Progressive based on following criteria: MULTISYSTEM THREATS such as stable sepsis, metabolic/electrolyte imbalance with or without encephalopathy that is responding to early treatment.  May place patient in observation at [facility] or [facility] if equivalent level of care is available:: No  Covid Evaluation: Asymptomatic - no recent exposure (last 10 days) testing not required  Diagnosis: Acute upper GI bleed [409811]  Admitting Physician: Angie Fava [[id number]]  Attending Physician: Angie Fava [[id number]]          B Medical/Surgery History Past Medical History:  Diagnosis Date   Anxiety    Phreesia 10/21/2020   Depression    Substance abuse (HCC)    Phreesia 10/21/2020   No past surgical history on file.   A IV Location/Drains/Wounds Patient Lines/Drains/Airways Status     Active Line/Drains/Airways     Name Placement date Placement time Site Days   Peripheral IV 04/05/23 Anterior;Right;Upper Arm 04/05/23  1945  Arm  1            Intake/Output Last 24 hours  Intake/Output Summary (Last 24 hours) at 04/06/2023 0132 Last data filed at 04/05/2023 2304 Gross per 24 hour  Intake 1800 ml  Output --  Net  1800 ml    Labs/Imaging Results for orders placed or performed during the hospital encounter of 04/05/23 (from the past 48 hour(s))  Lipase, blood     Status: Abnormal   Collection Time: 04/05/23  8:00 PM  Result Value Ref Range   Lipase 54 (H) 11 - 51 U/L    Comment: Performed at [facility], [address], [place], [place] [address]  Comprehensive metabolic panel     Status: Abnormal   Collection Time: 04/05/23  8:00 PM  Result Value Ref Range   Sodium 133 (L) 135 - 145 mmol/L   Potassium 5.1 3.5 - 5.1 mmol/L    Comment: HEMOLYSIS AT THIS LEVEL MAY AFFECT RESULT   Chloride 91 (L) 98 - 111 mmol/L   CO2 <7 (L) 22 - 32 mmol/L   Glucose, Bld 222 (H) 70 - 99 mg/dL    Comment: Glucose reference range applies only to samples taken after fasting for at least 8 hours.   BUN 13 6 - 20 mg/dL   Creatinine, Ser 6.96 (H) 0.61 - 1.24 mg/dL   Calcium 8.6 (L) 8.9 - 10.3 mg/dL   Total Protein 7.8 6.5 - 8.1 g/dL   Albumin 4.4 3.5 - 5.0 g/dL   AST 295 (H) 15 - 41 U/L    Comment: HEMOLYSIS AT THIS LEVEL MAY AFFECT RESULT   ALT 86 (H) 0 - 44 U/L    Comment: HEMOLYSIS AT THIS LEVEL MAY AFFECT RESULT  Alkaline Phosphatase 86 38 - 126 U/L   Total Bilirubin 1.0 0.3 - 1.2 mg/dL    Comment: HEMOLYSIS AT THIS LEVEL MAY AFFECT RESULT   GFR, Estimated >60 >60 mL/min    Comment: (NOTE) Calculated using the CKD-EPI Creatinine Equation (2021)    Anion gap NOT CALCULATED 5 - 15    Comment: Performed at [facility], [address], [place], [place] [address]  CBC     Status: Abnormal   Collection Time: 04/05/23  8:00 PM  Result Value Ref Range   WBC 11.2 (H) 4.0 - 10.5 K/uL   RBC 4.71 4.22 - 5.81 MIL/uL   Hemoglobin 15.1 13.0 - 17.0 g/dL   HCT 40.9 81.1 - 91.4 %   MCV 95.1 80.0 - 100.0 fL   MCH 32.1 26.0 - 34.0 pg   MCHC 33.7 30.0 - 36.0 g/dL   RDW 78.2 95.6 - 21.3 %   Platelets 340 150 - 400 K/uL   nRBC 0.0 0.0 - 0.2 %    Comment: Performed at [facility], [address] [place], [place] [address]  Ethanol     Status: Abnormal   Collection Time: 04/05/23 10:21 PM  Result Value Ref Range   Alcohol, Ethyl (B) 47 (H) <10 mg/dL    Comment: (NOTE) Lowest detectable limit for serum alcohol is 10 mg/dL.  For medical purposes only. Performed at [facility], [address] [place], [place] [address]   Salicylate level     Status: Abnormal   Collection Time: 04/05/23 10:21 PM  Result Value Ref Range   Salicylate Lvl <7.0 (L) 7.0 - 30.0 mg/dL    Comment: Performed at [facility], [address], [place], [place] [address]  Acetaminophen level     Status: Abnormal   Collection Time: 04/05/23 10:21 PM  Result Value Ref Range   Acetaminophen (Tylenol), Serum <10 (L) 10 - 30 ug/mL    Comment: (NOTE) Therapeutic concentrations vary significantly. A range of 10-30 ug/mL  may be an effective concentration for many patients. However, some  are best treated at concentrations outside of this range. Acetaminophen concentrations >150 ug/mL at 4 hours after ingestion  and >50 ug/mL at 12 hours after ingestion are often associated with  toxic reactions.  Performed at [facility], [address] [place], [place] [address]   Type and screen Ordered by PROVIDER DEFAULT     Status: None   Collection Time: 04/05/23 11:15 PM  Result Value Ref Range   ABO/RH(D) O POS    Antibody Screen NEG    Sample Expiration      04/08/2023,2359 Performed at [facility], [address], [place], [place] [address]   ABO/Rh     Status: None   Collection Time: 04/05/23 11:24 PM  Result Value Ref Range   ABO/RH(D)      O POS Performed at [facility], [address] [place], [place] [address]    CT CHEST ABDOMEN PELVIS W CONTRAST  Result Date: 04/05/2023 CLINICAL DATA:  Varicose veins EXAM: CT CHEST, ABDOMEN, AND PELVIS WITH CONTRAST TECHNIQUE: Multidetector CT imaging of the chest, abdomen and pelvis was performed following the standard protocol  during bolus administration of intravenous contrast. RADIATION DOSE REDUCTION: This exam was performed according to the departmental dose-optimization program which includes automated exposure control, adjustment of the mA and/or kV according to patient size and/or use of iterative reconstruction technique. CONTRAST:  75mL OMNIPAQUE IOHEXOL 350 MG/ML SOLN COMPARISON:  None Available. FINDINGS: CT CHEST FINDINGS Cardiovascular: Heart is normal size. Aorta is normal caliber. Mediastinum/Nodes: No mediastinal, hilar, or axillary adenopathy. Trachea and thyroid unremarkable. Diffusely thickened esophagus with paraesophageal edema/stranding most compatible with severe esophagitis. Lungs/Pleura: Lungs are clear. No focal airspace opacities or suspicious nodules. Trace right pleural effusion. Musculoskeletal: Chest wall soft tissues are unremarkable. No acute bony abnormality. CT ABDOMEN PELVIS FINDINGS Hepatobiliary: Diffuse low-density throughout the liver compatible with fatty infiltration. No focal abnormality. Gallbladder unremarkable. Pancreas: No focal abnormality or ductal dilatation. Spleen: No focal abnormality.  Normal size. Adrenals/Urinary Tract: No adrenal abnormality. No focal renal abnormality. No stones or hydronephrosis. Urinary bladder is unremarkable. Stomach/Bowel: Stomach is distended with fluid and gas. Large and small bowel decompressed, unremarkable. Normal appendix. Vascular/Lymphatic: No evidence of aneurysm or adenopathy. Venous structures widely patent. Reproductive: No visible focal abnormality. Other: No free fluid or free air. Musculoskeletal: No acute bony abnormality. IMPRESSION: Severe diffuse esophageal wall thickening with surrounding edema most compatible with severe esophagitis. Mild distention of the stomach with air and fluid. Trace right pleural effusion. Hepatic steatosis Electronically Signed   By: Charlett Nose M.D.   On: 04/05/2023 23:05    Pending Labs Unresulted Labs (From  admission, onward)     Start     Ordered   04/06/23 1300  Hemoglobin and hematocrit, blood  Once-Timed,   TIMED        04/06/23 0006   04/06/23 0900  Hemoglobin and hematocrit, blood  Once-Timed,   TIMED        04/06/23 0006   04/06/23 0500  CBC with Differential/Platelet  Tomorrow morning,   R        04/06/23 0006   04/06/23 0500  Comprehensive metabolic panel  Tomorrow morning,   R        04/06/23 0006   04/06/23 0500  Magnesium  Tomorrow morning,   R        04/06/23 0006   04/06/23 0500  Phosphorus  Tomorrow morning,   R        04/06/23 0006   04/06/23 0006  Magnesium  Add-on,   AD        04/06/23 0006   04/05/23 1953  Urinalysis, Routine w reflex microscopic -Urine, Clean Catch  Once,   URGENT       Question:  Specimen Source  Answer:  Urine, Clean Catch   04/05/23 1952            Vitals/Pain Today's Vitals   04/05/23 1948 04/05/23 2027 04/05/23 2304 04/06/23 0000  BP:    (!) 153/100  Pulse:    (!) 116  Resp:    (!) 21  Temp:    98.3 F (36.8 C)  TempSrc:    Oral  SpO2:  100%  100%  PainSc: 10-Worst pain ever  Asleep     Isolation Precautions No active isolations  Medications Medications  LORazepam (ATIVAN) tablet 1-4 mg (has no administration in time range)    Or  LORazepam (ATIVAN) injection 1-4 mg (has no administration in time range)  thiamine (VITAMIN B1) tablet 100 mg ( Oral See Alternative 04/05/23 2124)    Or  thiamine (VITAMIN B1) injection 100 mg (100 mg Intravenous Given 04/05/23 2124)  folic acid (FOLVITE) tablet 1 mg (has no administration in time range)  multivitamin with minerals tablet 1 tablet (has no administration in time range)  acetaminophen (TYLENOL) tablet 650 mg (has no administration in  time range)    Or  acetaminophen (TYLENOL) suppository 650 mg (has no administration in time range)  ondansetron (ZOFRAN) injection 4 mg (has no administration in time range)  naloxone [facility]) injection 0.4 mg (has no administration in time range)   fentaNYL (SUBLIMAZE) injection 50 mcg (50 mcg Intravenous Given 04/06/23 0028)  promethazine (PHENERGAN) 12.5 mg in sodium chloride 0.9 % 50 mL IVPB (has no administration in time range)  lactated ringers infusion ( Intravenous New Bag/Given 04/06/23 0032)  pantoprozole (PROTONIX) 80 mg /NS 100 mL infusion (has no administration in time range)  ondansetron (ZOFRAN) injection 4 mg (4 mg Intravenous Given 04/05/23 2000)  0.9 %  sodium chloride infusion (0 mLs Intravenous Stopped 04/05/23 2304)  morphine (PF) 4 MG/ML injection 4 mg (4 mg Intravenous Given 04/05/23 2121)  pantoprazole (PROTONIX) injection 40 mg (40 mg Intravenous Given 04/05/23 2123)  ondansetron (ZOFRAN) injection 4 mg (4 mg Intravenous Given 04/06/23 0030)  iohexol (OMNIPAQUE) 350 MG/ML injection 75 mL (75 mLs Intravenous Contrast Given 04/05/23 2255)  sodium chloride 0.9 % bolus 1,000 mL (1,000 mLs Intravenous New Bag/Given 04/06/23 0027)    Mobility walks     Focused Assessments Cardiac Assessment Handoff:    No results found for: "CKTOTAL", "CKMB", "CKMBINDEX", "TROPONINI" No results found for: "DDIMER" Does the Patient currently have chest pain? No    R Recommendations: See Admitting Provider Note  Report given to:   Additional Notes:

## 2023-04-06 NOTE — Progress Notes (Signed)
This is a 32 year old gentleman with history of alcohol abuse who was admitted after midnight with upper GI bleeding secondary to presentation with hematemesis.  Patient seen and examined in the ED.  He is hemodynamically stable.  He complains of mild upper abdominal pain and some nausea but he has not had any vomiting since presentation to the ED.  Hemoglobin has dropped some around 12 but no indication of transfusion.  He will definitely benefit from EGD.  I see that he has been seen by GI and they are planning for EGD but timing to be determined yet.  Continue Protonix drip, follow H&H this afternoon and early morning and transfuse if less than 7.  Appreciate GI help.  He also came in with AKI, received some fluids, BMP pending this morning.  He is on CIWA protocol with as needed Ativan but he is not withdrawing clinically at this point in time.

## 2023-04-06 NOTE — H&P (Signed)
History and Physical      Xavier Ramsey BJY:[id number] DOB: [birth year] DOA: 04/05/2023; DOS: 04/06/2023  PCP: Patient, No Pcp Per (will further assess) Patient coming from: home   I have personally briefly reviewed patient's old medical records in [facility]  Chief Complaint: Nausea, vomiting  HPI: Xavier Ramsey is a 32 y.o. male with medical history significant for chronic alcohol abuse, who is admitted to [facility] on 04/05/2023 with acute upper gastrointestinal bleed after presenting from home to [facility] ED complaining of nausea/vomiting.   Over the course the last day, the patient reports recurrent nausea resulting in at least 6-7 episodes of nonbilious emesis over that timeframe, most recent such episode occurring in the emergency department today.  After the first few episodes of nausea/vomiting, the patient reports that he is started to notice some bright red blood associated with his vomitus, in the absence of appearance of coffee grounds.  He notes associated epigastric discomfort, which she describes as sharp, nonradiating, and worse with palpation over the epigastrium.  Denies any associated diarrhea, melena, or hematochezia.  No assist with any subjective fever, chills, rigors, or generalized myalgias.  He is not on any blood thinners as an outpatient, including no aspirin.  He also denies any NSAID use as an outpatient.  Denies any known history of chronic underlying liver disease.  In the context of recurrent nausea/vomiting over the course the last day, units significant decline in oral intake of both food and water over that timeframe.  He has a documented history of chronic alcohol abuse, with a history of consuming 5-6 shots of liquor per day.  The patient conveys, with his mother at bedside, that he is recently reduced the volume of his daily alcohol consumption, reporting now that he consumes 2-3 shots of liquor per day, noting most recent alcohol consumed on  04/04/2023.  Denies any use of recreational drugs.  Denies any known history of underlying peptic ulcer disease.  No recent trauma.    ED Course:  Vital signs in the ED were notable for the following: Afebrile; initial heart rates in the 120s, socially decreasing into the 90s following interval IV fluids, as further quantified below; systolic blood pressures in the 140s to 150s; respiratory rate 17-21, oxygen saturation 100% on room air.  Labs were notable for the following: CMP notable for the following: Sodium 133, which corrects to approximately 135 when taking into account presenting hyperglycemia, chloride 91, bicarbonate 13 compared to 9 on 01/17/2023, creatinine 1.44 compared to 1.20 on 01/17/2023, glucose 222, AST 133 compared to 128 on 01/17/2023, ALT 86 compared to 104 on 01/17/2023, alkaline phosphatase 86, total bilirubin 1.0.  Lipase 54.  Serum ethanol level 47.  CBC notable for white blood cell count 11,200, hemoglobin 15.1 compared to most recent prior hemoglobin data point of 13.5 on 01/17/2023 completely count 340.  IV screen ordered.  Urinalysis ordered, Therazole currently pending.  Per my interpretation, EKG in ED demonstrated the following: Sinus tachycardia with heart rate 103, normal intervals, nonspecific T wave inversion in aVL, nonspecific less than 1 mm ST elevation in V1, otherwise no evidence of ST changes.  Imaging in the ED, per corresponding formal radiology read, was notable for the following: CT chest, abdomen, pelvis, with contrast, showed severe diffuse esophageal wall thickening with surrounding edema, most consistent with severe esophagitis, without evidence of esophageal perforation and no evidence of extravasation of contrast.  This imaging also showed evidence of hepatic steatosis: Demonstrate no  evidence of acute cholecystitis or any evidence of common bile duct dilation or any evidence of choledocholithiasis.  EDP contacted on-call [facility], Dr. Marina Goodell, requesting  consultation for acute upper GIB.   While in the ED, the following were administered: Thiamine 100 mg IV x 1, Protonix 40 mg IV x 1, Zofran 4 mg IV x 2, morphine 4 mg IV x 1, fentanyl 50 mcg IV x 1, normal saline x 1 L bolus.  Subsequently, the patient was admitted for further evaluation and management of presenting acute upper gastrointestinal bleed in the setting of presenting nausea/vomiting, with presenting labs also notable for acute kidney injury as well as mild leukocytosis.      Review of Systems: As per HPI otherwise 10 point review of systems negative.   Past Medical History:  Diagnosis Date   Anxiety    [employer] 10/21/2020   Depression    Substance abuse (HCC)    [employer] 10/21/2020    No past surgical history on file.  Social History:  reports that he has been smoking cigarettes. He has never used smokeless tobacco. He reports current alcohol use of about 2.0 standard drinks of alcohol per week. He reports that he does not currently use drugs after having used the following drugs: Marijuana.   No Known Allergies  Family History  Problem Relation Age of Onset   Hypertension Mother     Family history reviewed and not pertinent    Prior to Admission medications   Medication Sig Start Date End Date Taking? Authorizing Provider  gabapentin (NEURONTIN) 100 MG capsule Take 2 capsules (200 mg total) by mouth 2 (two) times daily. 01/20/23 02/19/23  Park Pope, MD  melatonin 5 MG TABS Take 1 tablet (5 mg total) by mouth at bedtime. 01/20/23   Park Pope, MD  traZODone (DESYREL) 100 MG tablet Take 1 tablet (100 mg total) by mouth at bedtime as needed for sleep. 01/20/23   Park Pope, MD     Objective    Physical Exam: Vitals:   04/05/23 1945 04/05/23 1947 04/06/23 0000  BP:  (!) 147/92 (!) 153/100  Pulse:  (!) 124 (!) 116  Resp:  20 (!) 21  Temp:  98 F (36.7 C) 98.3 F (36.8 C)  TempSrc:  Oral Oral  SpO2: 100% 100% 100%    General: appears to be stated age;  alert, oriented Skin: warm, dry, no rash Head:  AT/ Mouth:  Oral mucosa membranes appear dry, normal dentition Neck: supple; trachea midline Heart: Mildly tachycardic, but regular; did not appreciate any M/R/G Lungs: CTAB, did not appreciate any wheezes, rales, or rhonchi Abdomen: + BS; soft, ND, mild tenderness to palpation over the epigastrium, in the absence of guarding, rigidity, or rebound tenderness Vascular: 2+ pedal pulses b/l; 2+ radial pulses b/l Extremities: no peripheral edema, no muscle wasting Neuro: strength and sensation intact in upper and lower extremities b/l    Labs on Admission: I have personally reviewed following labs and imaging studies  CBC: Recent Labs  Lab 04/05/23 2000  WBC 11.2*  HGB 15.1  HCT 44.8  MCV 95.1  PLT 340   Basic Metabolic Panel: Recent Labs  Lab 04/05/23 2000  NA 133*  K 5.1  CL 91*  CO2 <7*  GLUCOSE 222*  BUN 13  CREATININE 1.44*  CALCIUM 8.6*   GFR: CrCl cannot be calculated (Unknown ideal weight.). Liver Function Tests: Recent Labs  Lab 04/05/23 2000  AST 133*  ALT 86*  ALKPHOS 86  BILITOT  1.0  PROT 7.8  ALBUMIN 4.4   Recent Labs  Lab 04/05/23 2000  LIPASE 54*   No results for input(s): "AMMONIA" in the last 168 hours. Coagulation Profile: No results for input(s): "INR", "PROTIME" in the last 168 hours. Cardiac Enzymes: No results for input(s): "CKTOTAL", "CKMB", "CKMBINDEX", "TROPONINI" in the last 168 hours. BNP (last 3 results) No results for input(s): "PROBNP" in the last 8760 hours. HbA1C: No results for input(s): "HGBA1C" in the last 72 hours. CBG: No results for input(s): "GLUCAP" in the last 168 hours. Lipid Profile: No results for input(s): "CHOL", "HDL", "LDLCALC", "TRIG", "CHOLHDL", "LDLDIRECT" in the last 72 hours. Thyroid Function Tests: No results for input(s): "TSH", "T4TOTAL", "FREET4", "T3FREE", "THYROIDAB" in the last 72 hours. Anemia Panel: No results for input(s): "VITAMINB12",  "FOLATE", "FERRITIN", "TIBC", "IRON", "RETICCTPCT" in the last 72 hours. Urine analysis: No results found for: "COLORURINE", "APPEARANCEUR", "LABSPEC", "PHURINE", "GLUCOSEU", "HGBUR", "BILIRUBINUR", "KETONESUR", "PROTEINUR", "UROBILINOGEN", "NITRITE", "LEUKOCYTESUR"  Radiological Exams on Admission: CT CHEST ABDOMEN PELVIS W CONTRAST  Result Date: 04/05/2023 CLINICAL DATA:  Varicose veins EXAM: CT CHEST, ABDOMEN, AND PELVIS WITH CONTRAST TECHNIQUE: Multidetector CT imaging of the chest, abdomen and pelvis was performed following the standard protocol during bolus administration of intravenous contrast. RADIATION DOSE REDUCTION: This exam was performed according to the departmental dose-optimization program which includes automated exposure control, adjustment of the mA and/or kV according to patient size and/or use of iterative reconstruction technique. CONTRAST:  75mL OMNIPAQUE IOHEXOL 350 MG/ML SOLN COMPARISON:  None Available. FINDINGS: CT CHEST FINDINGS Cardiovascular: Heart is normal size. Aorta is normal caliber. Mediastinum/Nodes: No mediastinal, hilar, or axillary adenopathy. Trachea and thyroid unremarkable. Diffusely thickened esophagus with paraesophageal edema/stranding most compatible with severe esophagitis. Lungs/Pleura: Lungs are clear. No focal airspace opacities or suspicious nodules. Trace right pleural effusion. Musculoskeletal: Chest wall soft tissues are unremarkable. No acute bony abnormality. CT ABDOMEN PELVIS FINDINGS Hepatobiliary: Diffuse low-density throughout the liver compatible with fatty infiltration. No focal abnormality. Gallbladder unremarkable. Pancreas: No focal abnormality or ductal dilatation. Spleen: No focal abnormality.  Normal size. Adrenals/Urinary Tract: No adrenal abnormality. No focal renal abnormality. No stones or hydronephrosis. Urinary bladder is unremarkable. Stomach/Bowel: Stomach is distended with fluid and gas. Large and small bowel decompressed,  unremarkable. Normal appendix. Vascular/Lymphatic: No evidence of aneurysm or adenopathy. Venous structures widely patent. Reproductive: No visible focal abnormality. Other: No free fluid or free air. Musculoskeletal: No acute bony abnormality. IMPRESSION: Severe diffuse esophageal wall thickening with surrounding edema most compatible with severe esophagitis. Mild distention of the stomach with air and fluid. Trace right pleural effusion. Hepatic steatosis Electronically Signed   By: Charlett Nose M.D.   On: 04/05/2023 23:05      Assessment/Plan   Principal Problem:   Acute upper GI bleed Active Problems:   Nausea & vomiting   AKI (acute kidney injury) (HCC)   Transaminitis   Leukocytosis   Chronic alcohol abuse   Tobacco abuse        #) Acute Upper GI Bleed: diagnosis on the basis of 3-4 episodes of hematemesis over the course of the last day, with elevated BUN relative to most recent prior.  Suspect contribution from severe esophagitis given history that is notable for hematemesis starting after first 2 episodes of nausea/vomiting, as well as today CT chest, abdomen, pelvis, showing evidence of severe esophagitis without evidence of esophageal perforation, and or any evidence of contrast extravasation.  Presentation, including imaging, appear less suggestive of Mallory-Weiss/Boerhaave.  In the setting of his  chronic alcohol abuse, differential also includes gastritis versus peptic ulcer disease.  Not on any blood thinners as an outpatient, including no aspirin. Denies NSAID use. No known history of known underlying liver disease, abdomen/pelvis shows no evidence of hepatic steatosis in the context of mild chronic transaminitis.  The patient has a history of chronic alcohol abuse, increasing risk for esophageal varices, although history and clinical presentation appear less suggestive of acute variceal bleed.  Consequently, we will refrain from initiation of octreotide at this time.  In  the absence of reported chronic liver disease, initiation of SBP prophylaxis does not appear to be warranted.  Given suspected upper GI source, will initiate Protonix drip.    At this time, the patient appears hemodynamically stable, with normotensive blood pressures as well as resolution of mild initial tachycardia with gentle initial IV fluids .  Hemoglobin appears stable, with presenting hemoglobin noted to be 15.1 compared to most recent prior value of 13.5, likely representative an element of interval relative hemoconcentration in the context of presenting dehydration.  At this time, empiric initiation of PVC transfusion does not appear to be indicated.   EDP contacted on-call [facility], Dr. Marina Goodell, requesting consultation for acute upper GIB.     Plan: NPO. Refraining from pharmacologic DVT prophylaxis. Monitor on telemetry. Monitor continuous pulse-ox. Maintain at least 2 large bore IV's. Check INR/PTT. Q4H H&H's have been ordered through 1300 on 7/25 . Will closely monitor these ensuing Hgb levels and correlate these data points with the patient's overall clinical picture including vital signs to determine need for ensuing transfusion. On-call GI consulted, as above, with additional recs pending at this time. CMP in the AM. Protonix drip. LR at 75 cc/hr x 12 hours. Prn Zofran.  As needed IV Phenergan for nausea/vomiting refractory to prn Zofran.                  #) Acute Kidney Injury:  as quantified above.  This appears to be prerenal in nature as a consequence of intravascular depletion stemming from increased GI losses in the form of recent nausea/vomiting, concomitant with decline in oral intake over the last day.  Urinalysis with microscopy ordered, with result currently pending.  Plan: monitor strict I's & O's and daily weights. Attempt to avoid nephrotoxic agents. Refrain from NSAIDs. Repeat CMP in the morning. Check serum magnesium level.  Follow-up for results of urinalysis with  microscopy.  Add-on random urine sodium and random urine creatinine.  IV fluids, as above.  Further evaluation management of presenting acute upper gastrointestinal bleed associated with recurrent nausea/vomiting, as above.  Urinary drug screen ordered.                #) Chronic transaminitis: Mild elevation in liver enzymes, with AST predominance, appears consistent with patient's history of chronic alcohol abuse, with CT abdomen/pelvis showing evidence of hepatic steatosis.  No evidence of cholestatic pattern on presenting labs, nor any evidence of biliary obstruction on today CT abdomen/pelvis.  No clinical or radiographic evidence to suggest acute cholecystitis at this time.  Plan: Repeat CMP in the morning.  Further evaluation management of chronic alcohol abuse, as below.  Check INR, PTT.  Urinary drug screen pending.                   #) Leukocytosis: Presenting CBC reflects mildly elevated white cell count of 11,200. Suspect an element of hemoconcentration in the setting of presenting nausea/vomiting and associated decline in oral intake over the  last day. This may also be reactive in nature in the setting of presenting nausea/vomiting, with CT evidence of esophagitis. No evidence to suggest underlying infectious process at this time, including CT chest, abdomen, pelvis, which shows no evidence of overt underlying infectious process.  Of note, urinalysis is currently pending.  In the absence of overt underlying infectious process, and in the absence of fever or white blood cell count greater than 12,000, criteria for sepsis not currently met.  appears hemodynamically stable.  Therefore, will refrain from initiation of antibiotics at this time.  Plan: Repeat CBC with diff in the morning.  Monitor strict I's and O's, daily weights.  Follow-up for results of urinalysis.  IV fluids, as above.               #) Chronic Alcohol Abuse: the patient reports typical  daily alcohol consumption of 5-6 shots of liquor per day, although he is now giving recent decrease to 2-3 shots of liquor per day. Denies any interest in alcohol consumption discontinuation at this time. Most recent alcohol consumption occurred on 04/04/2023, while noting that presenting serum ethanol level is 47. Close monitoring for development of evidence of alcohol withdrawal, including close attention to trend in vital signs, as well as close monitoring of electrolytes, as described below.  He was initially tachycardic, although this appears to be more so on the basis of dehydration, given interval improvement with IV fluids.  Overall, no overt evidence of current alcohol withdrawal.  He is status post receipt of thiamine 100 mg IV in the ED today.  Plan: counseled the patient on the importance of reduction in alcohol consumption. Consult to transition of care team placed. Close monitoring of ensuing BP and HR via routine VS. Symptoms-based CIWA protocol with prn Ativan ordered. Seizure precautions. Telemetry. Add-on serum Mg level. Check serum phosphorus level. Repeat CMP in the morning. Check INR.  UDS.  Daily thiamine, folic acid, multivitamin.                #) Chronic tobacco abuse: Patient conveys that they are a current smoker, having smoked 0.5 ppd for 5 years.   Plan: Counseled the patient for less than 2 minutes on the importance of complete smoking discontinuation.  Order placed for prn nicotine patch for use during this hospitalization.      DVT prophylaxis: SCD's   Code Status: Full code Family Communication: case d/w mother, as above Disposition Plan: Per Rounding Team Consults called: EDP contacted on-call [facility], Dr. Marina Goodell, requesting consultation for acute upper GIB.  Admission status: observation     I SPENT GREATER THAN 75  MINUTES IN CLINICAL CARE TIME/MEDICAL DECISION-MAKING IN COMPLETING THIS ADMISSION.      Chaney Born Lorie Cleckley DO [facility]  From 7PM - 7AM   04/06/2023, 12:10 AM

## 2023-04-07 ENCOUNTER — Encounter (HOSPITAL_COMMUNITY): Payer: Self-pay | Admitting: Family Medicine

## 2023-04-07 ENCOUNTER — Encounter (HOSPITAL_COMMUNITY)
Admission: EM | Disposition: A | Discharge status: Home / Self Care | Payer: Self-pay | Source: Home / Self Care | Attending: Family Medicine

## 2023-04-07 ENCOUNTER — Inpatient Hospital Stay (HOSPITAL_COMMUNITY): Payer: Medicaid Other | Admitting: Certified Registered"

## 2023-04-07 ENCOUNTER — Other Ambulatory Visit (HOSPITAL_COMMUNITY): Payer: Self-pay

## 2023-04-07 DIAGNOSIS — R1013 Epigastric pain: Principal | ICD-10-CM

## 2023-04-07 DIAGNOSIS — K2920 Alcoholic gastritis without bleeding: Secondary | ICD-10-CM

## 2023-04-07 DIAGNOSIS — K449 Diaphragmatic hernia without obstruction or gangrene: Secondary | ICD-10-CM

## 2023-04-07 DIAGNOSIS — K2970 Gastritis, unspecified, without bleeding: Secondary | ICD-10-CM

## 2023-04-07 DIAGNOSIS — K920 Hematemesis: Secondary | ICD-10-CM | POA: Diagnosis not present

## 2023-04-07 DIAGNOSIS — K700 Alcoholic fatty liver: Secondary | ICD-10-CM | POA: Diagnosis not present

## 2023-04-07 DIAGNOSIS — F1010 Alcohol abuse, uncomplicated: Secondary | ICD-10-CM | POA: Diagnosis not present

## 2023-04-07 DIAGNOSIS — K209 Esophagitis, unspecified without bleeding: Secondary | ICD-10-CM | POA: Diagnosis not present

## 2023-04-07 HISTORY — PX: BIOPSY: SHX5522

## 2023-04-07 HISTORY — PX: ESOPHAGOGASTRODUODENOSCOPY: SHX5428

## 2023-04-07 LAB — CBC WITH DIFFERENTIAL/PLATELET: WBC: 6.5 10*3/uL (ref 4.0–10.5)

## 2023-04-07 SURGERY — EGD (ESOPHAGOGASTRODUODENOSCOPY)
Anesthesia: Monitor Anesthesia Care

## 2023-04-07 MED ORDER — DEXMEDETOMIDINE HCL IN NACL 80 MCG/20ML IV SOLN
INTRAVENOUS | Status: DC | PRN
  Administered 2023-04-07 (×2): 4 ug via INTRAVENOUS

## 2023-04-07 MED ORDER — FENTANYL CITRATE (PF) 100 MCG/2ML IJ SOLN: 25.0000 ug | INTRAMUSCULAR | Status: DC | PRN

## 2023-04-07 MED ORDER — LACTATED RINGERS IV SOLN: INTRAVENOUS | Status: DC

## 2023-04-07 MED ORDER — PROPOFOL 10 MG/ML IV BOLUS
INTRAVENOUS | Status: DC | PRN
Start: 2023-04-07 — End: 2023-04-07
  Administered 2023-04-07: 50 mg via INTRAVENOUS

## 2023-04-07 MED ORDER — OXYCODONE HCL 5 MG/5ML PO SOLN: 5.0000 mg | Freq: Once | ORAL | Status: DC | PRN

## 2023-04-07 MED ORDER — AMLODIPINE BESYLATE 5 MG PO TABS
5.0000 mg | ORAL_TABLET | Freq: Every day | ORAL | 0 refills | Status: DC
  Filled 2023-04-07: qty 90, 90d supply, fill #0

## 2023-04-07 MED ORDER — ONDANSETRON HCL 4 MG/2ML IJ SOLN: 4.0000 mg | Freq: Four times a day (QID) | INTRAMUSCULAR | Status: DC | PRN

## 2023-04-07 MED ORDER — OMEPRAZOLE 40 MG PO CPDR
40.0000 mg | DELAYED_RELEASE_CAPSULE | Freq: Two times a day (BID) | ORAL | 1 refills | Status: DC
  Filled 2023-04-07: qty 60, 30d supply, fill #0

## 2023-04-07 MED ORDER — PROPOFOL 500 MG/50ML IV EMUL
INTRAVENOUS | Status: DC | PRN
  Administered 2023-04-07: 175 ug/kg/min via INTRAVENOUS

## 2023-04-07 MED ORDER — OXYCODONE HCL 5 MG PO TABS: 5.0000 mg | ORAL_TABLET | Freq: Once | ORAL | Status: DC | PRN

## 2023-04-07 MED ORDER — LIDOCAINE 2% (20 MG/ML) 5 ML SYRINGE
INTRAMUSCULAR | Status: DC | PRN
  Administered 2023-04-07: 80 mg via INTRAVENOUS

## 2023-04-07 NOTE — Discharge Summary (Signed)
Physician Discharge Summary  Xavier Ramsey WGN:[id number] DOB: 07/15/1991 DOA: 04/05/2023  PCP: Patient, No Pcp Per  Admit date: 04/05/2023 Discharge date: 04/07/2023 30 Day Unplanned Readmission Risk Score    Flowsheet Row ED to Hosp-Admission (Current) from 04/05/2023 in [facility]  30 Day Unplanned Readmission Risk Score (%) 8.74 Filed at 04/07/2023 0801       This score is the patient's risk of an unplanned readmission within 30 days of being discharged (0 -100%). The score is based on dignosis, age, lab data, medications, orders, and past utilization.   Low:  0-14.9   Medium: 15-21.9   High: 22-29.9   Extreme: 30 and above          Admitted From: Home Disposition: Home  Recommendations for Outpatient Follow-up:  Follow up with PCP in 1-2 weeks Please obtain BMP/CBC in one week Follow-up with GI in 4 to 8 weeks for repeat EGD Please follow up with your PCP on the following pending results: Unresulted Labs (From admission, onward)     Start     Ordered   04/06/23 0202  Rapid urine drug screen (hospital performed)  Add-on,   AD        04/06/23 0202   04/06/23 0202  Sodium, urine, random  Add-on,   AD        04/06/23 0202   04/06/23 0202  Creatinine, urine, random  Add-on,   AD        04/06/23 0202   04/05/23 1953  Urinalysis, Routine w reflex microscopic -Urine, Clean Catch  Once,   URGENT       Question:  Specimen Source  Answer:  Urine, Clean Catch   04/05/23 1952              Home Health: None Equipment/Devices: None  Discharge Condition: Stable CODE STATUS: Full code Diet recommendation: Regular but avoid soda, coffee, alcohol, NSAIDs and tobacco  Subjective: Seen and examined.  No complaints.  Has not had any vomiting/hematemesis since admission.  Brief/Interim Summary: This is a 32 year old gentleman with history of alcohol abuse who presented with couple of episodes of hematemesis and was admitted under hospital service for  upper GI bleed.  Started on Protonix drip, GI consulted.  Hemoglobin 15.1 upon presentation and now 13.0, still not anemic and no requirement of blood transfusion.  Underwent EGD today and was found to have LA Grade D reflux esophagitis with no bleeding and alcoholic gastritis.  GI recommended omeprazole 40 p.o. twice daily for 8 weeks and repeat EGD in 8 weeks and cleared the patient for discharge.  Currently on regular diet, if tolerated, he will be discharged home.  He has been advised to avoid alcohol, coffee, soda, NSAIDs and tobacco.  Hypertension: No previous history of hypertension, perhaps because he is not following any physician.  However his blood pressure remained elevated for most part here and based on that, he is being discharged on amlodipine 5 mg p.o. daily.  Discharge plan was discussed with patient and/or family member and they verbalized understanding and agreed with it.  Discharge Diagnoses:  Principal Problem:   Acute upper GI bleed Active Problems:   Nausea & vomiting   AKI (acute kidney injury) (HCC)   Transaminitis   Leukocytosis   Chronic alcohol abuse   Tobacco abuse   GIB (gastrointestinal bleeding)   Epigastric pain   Acute esophagitis   Gastritis and gastroduodenitis    Discharge Instructions   Allergies as of  04/07/2023   No Known Allergies      Medication List     TAKE these medications    omeprazole 40 MG capsule Commonly known as: PRILOSEC Take 1 capsule (40 mg total) by mouth 2 (two) times daily.        Follow-up Information     PCP Follow up in 1 week(s).                 No Known Allergies  Consultations: GI   Procedures/Studies: CT CHEST ABDOMEN PELVIS W CONTRAST  Result Date: 04/05/2023 CLINICAL DATA:  Varicose veins EXAM: CT CHEST, ABDOMEN, AND PELVIS WITH CONTRAST TECHNIQUE: Multidetector CT imaging of the chest, abdomen and pelvis was performed following the standard protocol during bolus administration of  intravenous contrast. RADIATION DOSE REDUCTION: This exam was performed according to the departmental dose-optimization program which includes automated exposure control, adjustment of the mA and/or kV according to patient size and/or use of iterative reconstruction technique. CONTRAST:  75mL OMNIPAQUE IOHEXOL 350 MG/ML SOLN COMPARISON:  None Available. FINDINGS: CT CHEST FINDINGS Cardiovascular: Heart is normal size. Aorta is normal caliber. Mediastinum/Nodes: No mediastinal, hilar, or axillary adenopathy. Trachea and thyroid unremarkable. Diffusely thickened esophagus with paraesophageal edema/stranding most compatible with severe esophagitis. Lungs/Pleura: Lungs are clear. No focal airspace opacities or suspicious nodules. Trace right pleural effusion. Musculoskeletal: Chest wall soft tissues are unremarkable. No acute bony abnormality. CT ABDOMEN PELVIS FINDINGS Hepatobiliary: Diffuse low-density throughout the liver compatible with fatty infiltration. No focal abnormality. Gallbladder unremarkable. Pancreas: No focal abnormality or ductal dilatation. Spleen: No focal abnormality.  Normal size. Adrenals/Urinary Tract: No adrenal abnormality. No focal renal abnormality. No stones or hydronephrosis. Urinary bladder is unremarkable. Stomach/Bowel: Stomach is distended with fluid and gas. Large and small bowel decompressed, unremarkable. Normal appendix. Vascular/Lymphatic: No evidence of aneurysm or adenopathy. Venous structures widely patent. Reproductive: No visible focal abnormality. Other: No free fluid or free air. Musculoskeletal: No acute bony abnormality. IMPRESSION: Severe diffuse esophageal wall thickening with surrounding edema most compatible with severe esophagitis. Mild distention of the stomach with air and fluid. Trace right pleural effusion. Hepatic steatosis Electronically Signed   By: Charlett Nose M.D.   On: 04/05/2023 23:05     Discharge Exam: Vitals:   04/07/23 1105 04/07/23 1132  BP: (!)  134/92 (!) 137/102  Pulse: 64 71  Resp: 16 18  Temp:  98.1 F (36.7 C)  SpO2: 100% 100%   Vitals:   04/07/23 1050 04/07/23 1100 04/07/23 1105 04/07/23 1132  BP: (!) 132/92 (!) 137/90 (!) 134/92 (!) 137/102  Pulse: 77 64 64 71  Resp: 18 18 16 18   Temp: 97.7 F (36.5 C)   98.1 F (36.7 C)  TempSrc:    Oral  SpO2: 100% 100% 100% 100%  Weight:      Height:        General: Pt is alert, awake, not in acute distress Cardiovascular: RRR, S1/S2 +, no rubs, no gallops Respiratory: CTA bilaterally, no wheezing, no rhonchi Abdominal: Soft, NT, ND, bowel sounds + Extremities: no edema, no cyanosis    The results of significant diagnostics from this hospitalization (including imaging, microbiology, ancillary and laboratory) are listed below for reference.     Microbiology: No results found for this or any previous visit (from the past 240 hour(s)).   Labs: BNP (last 3 results) No results for input(s): "BNP" in the last 8760 hours. Basic Metabolic Panel: Recent Labs  Lab 04/05/23 2000 04/06/23 0911  NA 133* 134*  K 5.1 3.7  CL 91* 98  CO2 <7* 24  GLUCOSE 222* 119*  BUN 13 8  CREATININE 1.44* 1.13  CALCIUM 8.6* 8.9  MG  --  1.8  PHOS  --  2.7   Liver Function Tests: Recent Labs  Lab 04/05/23 2000 04/06/23 0911  AST 133* 82*  ALT 86* 66*  ALKPHOS 86 61  BILITOT 1.0 2.9*  PROT 7.8 6.2*  ALBUMIN 4.4 3.6   Recent Labs  Lab 04/05/23 2000  LIPASE 54*   No results for input(s): "AMMONIA" in the last 168 hours. CBC: Recent Labs  Lab 04/05/23 2000 04/06/23 0911 04/06/23 1315 04/07/23 0405  WBC 11.2* 5.9  --  6.5  NEUTROABS  --  4.2  --  4.1  HGB 15.1 12.6* 12.3* 13.0  HCT 44.8 34.8* 34.6* 35.2*  MCV 95.1 91.6  --  91.4  PLT 340 235  --  204   Cardiac Enzymes: No results for input(s): "CKTOTAL", "CKMB", "CKMBINDEX", "TROPONINI" in the last 168 hours. BNP: Invalid input(s): "POCBNP" CBG: No results for input(s): "GLUCAP" in the last 168  hours. D-Dimer No results for input(s): "DDIMER" in the last 72 hours. Hgb A1c No results for input(s): "HGBA1C" in the last 72 hours. Lipid Profile No results for input(s): "CHOL", "HDL", "LDLCALC", "TRIG", "CHOLHDL", "LDLDIRECT" in the last 72 hours. Thyroid function studies No results for input(s): "TSH", "T4TOTAL", "T3FREE", "THYROIDAB" in the last 72 hours.  Invalid input(s): "FREET3" Anemia work up No results for input(s): "VITAMINB12", "FOLATE", "FERRITIN", "TIBC", "IRON", "RETICCTPCT" in the last 72 hours. Urinalysis No results found for: "COLORURINE", "APPEARANCEUR", "LABSPEC", "PHURINE", "GLUCOSEU", "HGBUR", "BILIRUBINUR", "KETONESUR", "PROTEINUR", "UROBILINOGEN", "NITRITE", "LEUKOCYTESUR" Sepsis Labs Recent Labs  Lab 04/05/23 2000 04/06/23 0911 04/07/23 0405  WBC 11.2* 5.9 6.5   Microbiology No results found for this or any previous visit (from the past 240 hour(s)).  FURTHER DISCHARGE INSTRUCTIONS:   Get Medicines reviewed and adjusted: Please take all your medications with you for your next visit with your Primary MD   Laboratory/radiological data: Please request your Primary MD to go over all hospital tests and procedure/radiological results at the follow up, please ask your Primary MD to get all Hospital records sent to his/her office.   In some cases, they will be blood work, cultures and biopsy results pending at the time of your discharge. Please request that your primary care M.D. goes through all the records of your hospital data and follows up on these results.   Also Note the following: If you experience worsening of your admission symptoms, develop shortness of breath, life threatening emergency, suicidal or homicidal thoughts you must seek medical attention immediately by calling 911 or calling your MD immediately  if symptoms less severe.   You must read complete instructions/literature along with all the possible adverse reactions/side effects for all  the Medicines you take and that have been prescribed to you. Take any new Medicines after you have completely understood and accpet all the possible adverse reactions/side effects.    Do not drive when taking Pain medications or sleeping medications (Benzodaizepines)   Do not take more than prescribed Pain, Sleep and Anxiety Medications. It is not advisable to combine anxiety,sleep and pain medications without talking with your primary care practitioner   Special Instructions: If you have smoked or chewed Tobacco  in the last 2 yrs please stop smoking, stop any regular Alcohol  and or any Recreational drug use.   Wear Seat belts while driving.   Please  note: You were cared for by a hospitalist during your hospital stay. Once you are discharged, your primary care physician will handle any further medical issues. Please note that NO REFILLS for any discharge medications will be authorized once you are discharged, as it is imperative that you return to your primary care physician (or establish a relationship with a primary care physician if you do not have one) for your post hospital discharge needs so that they can reassess your need for medications and monitor your lab values  Time coordinating discharge: Over 30 minutes  SIGNED:   Hughie Closs, MD  [facility] 04/07/2023, 11:41 AM *Please note that this is a verbal dictation therefore any spelling or grammatical errors are due to the "[employer]agon [employer] One" system interpretation. If 7PM-7AM, please contact night-coverage [email or web address]

## 2023-04-07 NOTE — Interval H&P Note (Signed)
History and Physical Interval Note:  04/07/2023 10:22 AM  Xavier Ramsey  has presented today for surgery, with the diagnosis of hematemesis, esophagitis.  The various methods of treatment have been discussed with the patient and family. After consideration of risks, benefits and other options for treatment, the patient has consented to  Procedure(s): ESOPHAGOGASTRODUODENOSCOPY (EGD) (N/A) as a surgical intervention.  The patient's history has been reviewed, patient examined, no change in status, stable for surgery.  I have reviewed the patient's chart and labs.  Questions were answered to the patient's satisfaction.     Jenel Lucks

## 2023-04-07 NOTE — Progress Notes (Signed)
Pt asked to be unplugged for shower. As pt on several sedating meds, NPO and cont IV infusion, will give bed bath today.

## 2023-04-07 NOTE — Transfer of Care (Signed)
Immediate Anesthesia Transfer of Care Note  Patient: Xavier Ramsey  Procedure(s) Performed: ESOPHAGOGASTRODUODENOSCOPY (EGD) BIOPSY  Patient Location: PACU  Anesthesia Type:MAC  Level of Consciousness: awake and alert   Airway & Oxygen Therapy: Patient Spontanous Breathing  Post-op Assessment: Report given to RN and Post -op Vital signs reviewed and stable  Post vital signs: Reviewed and stable  Last Vitals:  Vitals Value Taken Time  BP 132/92 04/07/23 1050  Temp 36.5 C 04/07/23 1050  Pulse 70 04/07/23 1052  Resp 18 04/07/23 1052  SpO2 100 % 04/07/23 1052  Vitals shown include unfiled device data.  Last Pain:  Vitals:   04/07/23 0922  TempSrc: Temporal  PainSc: 9          Complications: There were no known notable events for this encounter.

## 2023-04-07 NOTE — Progress Notes (Signed)
Pt picked up by endo. Pt has been NPO. VSS. Bath with CHG given this AM.   Consuella Lose RN  [phone]

## 2023-04-07 NOTE — Op Note (Signed)
[facility] Patient Name: Xavier Ramsey Procedure Date : 04/07/2023 MRN: [id number] Attending MD: Dub Amis. Tomasa Rand , MD, [id number] Date of Birth: 1991/04/12 CSN: [id number] Age: 32 Admit Type: Inpatient Procedure:                Upper GI endoscopy Indications:              Hematemesis Providers:                Lorin Picket E. Tomasa Rand, MD, Doristine Mango, RN,                            Priscella Mann, Technician Referring MD:              Medicines:                Monitored Anesthesia Care Complications:            No immediate complications. Estimated Blood Loss:     Estimated blood loss was minimal. Procedure:                Pre-Anesthesia Assessment:                           - Prior to the procedure, a History and Physical                            was performed, and patient medications and                            allergies were reviewed. The patient's tolerance of                            previous anesthesia was also reviewed. The risks                            and benefits of the procedure and the sedation                            options and risks were discussed with the patient.                            All questions were answered, and informed consent                            was obtained. Prior Anticoagulants: The patient has                            taken no anticoagulant or antiplatelet agents. ASA                            Grade Assessment: II - A patient with mild systemic                            disease. After reviewing the risks and benefits,  the patient was deemed in satisfactory condition to                            undergo the procedure.                           After obtaining informed consent, the endoscope was                            passed under direct vision. Throughout the                            procedure, the patient's blood pressure, pulse, and                            oxygen  saturations were monitored continuously. The                            [id number]-[id number] ([id number]) Olympus endoscope was introduced                            through the mouth, and advanced to the second part                            of duodenum. The upper GI endoscopy was                            accomplished without difficulty. The patient                            tolerated the procedure well. Scope In: Scope Out: Findings:      The examined portions of the nasopharynx, oropharynx and larynx were       normal.      LA Grade D (one or more mucosal breaks involving at least 75% of       esophageal circumference) esophagitis with no bleeding was found.       Biopsies were taken with a cold forceps for histology. Estimated blood       loss was minimal.      The exam of the esophagus was otherwise normal.      A 3 cm hiatal hernia was present.      Segmental moderate inflammation characterized by erythema was found in       the gastric fundus and in the gastric body. Biopsies were taken with a       cold forceps for Helicobacter pylori testing. Estimated blood loss was       minimal.      The exam of the stomach was otherwise normal.      The examined duodenum was normal. Impression:               - The examined portions of the nasopharynx,                            oropharynx and larynx were normal.                           -  LA Grade D reflux esophagitis with no bleeding.                            Biopsied.                           - 3 cm hiatal hernia.                           - Alcoholic gastritis. Biopsied.                           - Normal examined duodenum.                           - The hematemesis was most likely secondary to                            severe esophagitis. No evidence of varices, Mallory                            Weiss tear or peptic ulcer disease. Moderate Sedation:      N/A Recommendation:           - Return patient to hospital ward for possible                             discharge same day.                           - Resume previous diet.                           - Use Protonix (pantoprazole) 40 mg PO BID for 8                            weeks.                           - Await pathology results.                           - Recommend repeat EGD sometime after 8 weeks to                            ensure healing/resolution of esophagitis. We will                            arrange routine outpatient follow up.                           - Avoid alcohol, tobacco and NSAIDs which can                            cause/worsen gastritis                           -  GI will sign off at this time. Procedure Code(s):        --- Professional ---                           [id number], Esophagogastroduodenoscopy, flexible,                            transoral; with biopsy, single or multiple Diagnosis Code(s):        --- Professional ---                           K21.00, Gastro-esophageal reflux disease with                            esophagitis, without bleeding                           K44.9, Diaphragmatic hernia without obstruction or                            gangrene                           K29.20, Alcoholic gastritis without bleeding                           K92.0, Hematemesis CPT copyright 2022 American Medical Association. All rights reserved. The codes documented in this report are preliminary and upon coder review may  be revised to meet current compliance requirements. Raeshawn Vo E. Tomasa Rand, MD 04/07/2023 10:55:20 AM This report has been signed electronically. Number of Addenda: 0

## 2023-04-07 NOTE — Anesthesia Preprocedure Evaluation (Signed)
Anesthesia Evaluation  Patient identified by MRN, date of birth, ID band Patient awake    Reviewed: Allergy & Precautions, H&P , NPO status , Patient's Chart, lab work & pertinent test results  Airway Mallampati: II   Neck ROM: full    Dental   Pulmonary Current Smoker   breath sounds clear to auscultation       Cardiovascular  Rhythm:regular Rate:Normal     Neuro/Psych  PSYCHIATRIC DISORDERS Anxiety Depression       GI/Hepatic ,,,(+)     substance abuse  alcohol useGI bleeding   Endo/Other    Renal/GU      Musculoskeletal   Abdominal   Peds  Hematology   Anesthesia Other Findings   Reproductive/Obstetrics                             Anesthesia Physical Anesthesia Plan  ASA: 2  Anesthesia Plan: MAC   Post-op Pain Management:    Induction: Intravenous  PONV Risk Score and Plan: 0 and Propofol infusion and Treatment may vary due to age or medical condition  Airway Management Planned: Nasal Cannula  Additional Equipment:   Intra-op Plan:   Post-operative Plan:   Informed Consent: I have reviewed the patients History and Physical, chart, labs and discussed the procedure including the risks, benefits and alternatives for the proposed anesthesia with the patient or authorized representative who has indicated his/her understanding and acceptance.     Dental advisory given  Plan Discussed with: CRNA, Anesthesiologist and Surgeon  Anesthesia Plan Comments:        Anesthesia Quick Evaluation

## 2023-04-07 NOTE — Progress Notes (Signed)
Pt d/c'd to home with mother. D/C instructions discussed with pt and his mother. Pt IV d/c'd. Pt wheeled to mother's car by RN. AVS given to pt, all questions answered. Meds (omiprazole and norvasc) given to pt from [facility] pharmacy.

## 2023-04-08 NOTE — Anesthesia Postprocedure Evaluation (Signed)
Anesthesia Post Note  Patient: Xavier Ramsey  Procedure(s) Performed: ESOPHAGOGASTRODUODENOSCOPY (EGD) BIOPSY     Patient location during evaluation: Endoscopy Anesthesia Type: MAC Level of consciousness: awake and alert Pain management: pain level controlled Vital Signs Assessment: post-procedure vital signs reviewed and stable Respiratory status: spontaneous breathing, nonlabored ventilation, respiratory function stable and patient connected to nasal cannula oxygen Cardiovascular status: stable and blood pressure returned to baseline Postop Assessment: no apparent nausea or vomiting Anesthetic complications: no   There were no known notable events for this encounter.  Last Vitals:  Vitals:   04/07/23 1400 04/07/23 1415  BP:    Pulse: 81 (!) 105  Resp:    Temp:    SpO2: 97% 98%    Last Pain:  Vitals:   04/07/23 1300  TempSrc:   PainSc: 0-No pain                 Kingdom Vanzanten S

## 2023-04-10 ENCOUNTER — Encounter (HOSPITAL_COMMUNITY): Payer: Self-pay | Admitting: Gastroenterology

## 2023-04-18 NOTE — Progress Notes (Signed)
Xavier Ramsey,  The biopsies taken from your stomach were notable for mild chronic gastritis (inflammation) which is a common finding, but there was no evidence of Helicobacter pylori infection. There is no specific treatment or further evaluation recommended for gastritis, other than to avoid insults such as tobacco and alcohol. The biopsies of your esophagus showed inflammatory changes, most likely related to stomach acid.  No evidence of infection or malignancy was seen. Please take the Protonix twice daily for 8 weeks, then once daily until your repeat EGD.  [name],  Can you please get Xavier Ramsey scheduled for a repeat EGD in the [facility] in 8-12 weeks to assess healing of severe esophagitis?

## 2023-04-25 ENCOUNTER — Other Ambulatory Visit: Payer: Self-pay

## 2023-04-25 DIAGNOSIS — K209 Esophagitis, unspecified without bleeding: Secondary | ICD-10-CM

## 2023-04-25 NOTE — Progress Notes (Signed)
Pt scheduled for repeat EGD in the [facility] 06/16/23 at 8:30am. Pt scheduled to see Dr. Tomasa Rand 07/04/23 at 1:30pm. Amb ref in epic. Pt mailed instructions and appt letter as we have not been able to reach pt by phone.

## 2023-05-10 ENCOUNTER — Other Ambulatory Visit: Payer: Self-pay

## 2023-05-10 ENCOUNTER — Encounter (HOSPITAL_COMMUNITY): Payer: Self-pay | Admitting: Emergency Medicine

## 2023-05-10 ENCOUNTER — Emergency Department (HOSPITAL_COMMUNITY)
Admission: EM | Admit: 2023-05-10 | Discharge: 2023-05-10 | Disposition: A | Discharge status: Home / Self Care | Payer: MEDICAID | Attending: Emergency Medicine | Admitting: Emergency Medicine

## 2023-05-10 DIAGNOSIS — E876 Hypokalemia: Secondary | ICD-10-CM | POA: Diagnosis not present

## 2023-05-10 DIAGNOSIS — R109 Unspecified abdominal pain: Secondary | ICD-10-CM | POA: Diagnosis present

## 2023-05-10 DIAGNOSIS — K2920 Alcoholic gastritis without bleeding: Secondary | ICD-10-CM | POA: Insufficient documentation

## 2023-05-10 DIAGNOSIS — F17210 Nicotine dependence, cigarettes, uncomplicated: Secondary | ICD-10-CM | POA: Diagnosis not present

## 2023-05-10 LAB — LIPASE, BLOOD: Lipase: 33 U/L (ref 11–51)

## 2023-05-10 LAB — COMPREHENSIVE METABOLIC PANEL
ALT: 31 U/L (ref 0–44)
AST: 32 U/L (ref 15–41)
Albumin: 4.2 g/dL (ref 3.5–5.0)
Alkaline Phosphatase: 85 U/L (ref 38–126)
Anion gap: 13 (ref 5–15)
BUN: <5 mg/dL — ABNORMAL LOW (ref 6–20)
CO2: 23 mmol/L (ref 22–32)
Calcium: 8.8 mg/dL — ABNORMAL LOW (ref 8.9–10.3)
Chloride: 104 mmol/L (ref 98–111)
Creatinine, Ser: 1.06 mg/dL (ref 0.61–1.24)
GFR, Estimated: >60 mL/min (ref >60)
Glucose, Bld: 153 mg/dL — ABNORMAL HIGH (ref 70–99)
Potassium: 3 mmol/L — ABNORMAL LOW (ref 3.5–5.1)
Sodium: 140 mmol/L (ref 135–145)
Total Bilirubin: 1.3 mg/dL — ABNORMAL HIGH (ref 0.3–1.2)
Total Protein: 7.4 g/dL (ref 6.5–8.1)

## 2023-05-10 LAB — CBC
HCT: 41.6 % (ref 39.0–52.0)
Hemoglobin: 14.9 g/dL (ref 13.0–17.0)
MCH: 32.7 pg (ref 26.0–34.0)
MCHC: 35.8 g/dL (ref 30.0–36.0)
MCV: 91.2 fL (ref 80.0–100.0)
Platelets: 254 10*3/uL (ref 150–400)
RBC: 4.56 MIL/uL (ref 4.22–5.81)
RDW: 15.6 % — ABNORMAL HIGH (ref 11.5–15.5)
WBC: 6.1 10*3/uL (ref 4.0–10.5)
nRBC: 0 % (ref 0.0–0.2)

## 2023-05-10 MED ORDER — POTASSIUM CHLORIDE CRYS ER 20 MEQ PO TBCR
40.0000 meq | EXTENDED_RELEASE_TABLET | Freq: Once | ORAL | Status: AC
  Administered 2023-05-10: 40 meq via ORAL
  Filled 2023-05-10: qty 2

## 2023-05-10 MED ORDER — MYLANTA MAXIMUM STRENGTH 400-400-40 MG/5ML PO SUSP: 15.0000 mL | Freq: Four times a day (QID) | ORAL | 0 refills | Status: DC | PRN

## 2023-05-10 MED ORDER — ONDANSETRON HCL 4 MG PO TABS: 4.0000 mg | ORAL_TABLET | Freq: Three times a day (TID) | ORAL | 0 refills | Status: DC | PRN

## 2023-05-10 MED ORDER — SODIUM CHLORIDE 0.9 % IV BOLUS
1000.0000 mL | Freq: Once | INTRAVENOUS | Status: AC
  Administered 2023-05-10: 1000 mL via INTRAVENOUS

## 2023-05-10 MED ORDER — LIDOCAINE VISCOUS HCL 2 % MT SOLN
15.0000 mL | Freq: Once | OROMUCOSAL | Status: AC
  Administered 2023-05-10: 15 mL via ORAL
  Filled 2023-05-10: qty 15

## 2023-05-10 MED ORDER — ALUM & MAG HYDROXIDE-SIMETH 200-200-20 MG/5ML PO SUSP
30.0000 mL | Freq: Once | ORAL | Status: AC
  Administered 2023-05-10: 30 mL via ORAL
  Filled 2023-05-10: qty 30

## 2023-05-10 MED ORDER — ONDANSETRON HCL 4 MG/2ML IJ SOLN
4.0000 mg | Freq: Once | INTRAMUSCULAR | Status: AC
  Administered 2023-05-10: 4 mg via INTRAVENOUS
  Filled 2023-05-10: qty 2

## 2023-05-10 MED ORDER — FAMOTIDINE IN NACL 20-0.9 MG/50ML-% IV SOLN
20.0000 mg | Freq: Once | INTRAVENOUS | Status: AC
  Administered 2023-05-10: 20 mg via INTRAVENOUS
  Filled 2023-05-10: qty 50

## 2023-05-10 MED ORDER — OMEPRAZOLE 40 MG PO CPDR: 40.0000 mg | DELAYED_RELEASE_CAPSULE | Freq: Two times a day (BID) | ORAL | 1 refills | Status: DC

## 2023-05-10 NOTE — Discharge Instructions (Addendum)
We evaluated you for your abdominal pain and vomiting.  Your symptoms are likely due to continued alcohol use.  This can cause irritation and inflammation to your stomach and esophagus.   Your laboratory testing was reassuring.  Your potassium was slightly low likely because you have been vomiting.  Please eat high potassium foods such as potatoes and bananas for the next few days.  Please stop drinking alcohol.  This is only going to make your symptoms worse.  I have prescribed you an antiacid medication called omeprazole which you should take twice daily.  When you were in the hospital in July, the gastroenterologists wanted to see you in their clinic to make sure that your symptoms were getting better.  Please call them to help schedule follow-up.  If you have any worsening symptoms such as increasing pain, fevers, uncontrolled vomiting, lightheadedness or dizziness, fainting, vomiting large amounts of blood, or any other concerning symptoms, please return to the emergency department so we can reassess you.

## 2023-05-10 NOTE — ED Notes (Signed)
Discharge instructions discussed with pt. Verbalized understanding. VSS. No questions or concerns regarding discharge  

## 2023-05-10 NOTE — ED Triage Notes (Signed)
Pt presents with complaint of vomiting blood.  Pt states he has been seen for same about a month ago.  He reports he was diagnosed with an "inflamed esophagus"  He reports symptoms do worsen with drinking alcohol.

## 2023-05-10 NOTE — ED Provider Notes (Signed)
[place] EMERGENCY DEPARTMENT AT [facility] Provider Note  CSN: [id number] Arrival date & time: 05/10/23 1516  Chief Complaint(s) Hematemesis  HPI Xavier Ramsey is a 32 y.o. male history of alcohol use, esophagitis presenting to the emergency department with abdominal pain and vomiting.  Patient reports that he has had symptoms intermittently for the past month, today symptoms are similar.  They are worse with drinking alcohol.  Today had some blood in his vomit.  He was told to pick up a prescription for antacids when he was seen in hospital recently however he never picked this medicine up.  Denies NSAID use, smoking.  No black stools.  No blood thinner usage.  No fevers or chills.  No chest pain.  No difficulty breathing.   Past Medical History Past Medical History:  Diagnosis Date   Anxiety    Phreesia 10/21/2020   Depression    Substance abuse (HCC)    Phreesia 10/21/2020   Patient Active Problem List   Diagnosis Date Noted   Epigastric pain 04/07/2023   Acute esophagitis 04/07/2023   Gastritis and gastroduodenitis 04/07/2023   Acute upper GI bleed 04/06/2023   Nausea & vomiting 04/06/2023   AKI (acute kidney injury) (HCC) 04/06/2023   Transaminitis 04/06/2023   Leukocytosis 04/06/2023   Chronic alcohol abuse 04/06/2023   Tobacco abuse 04/06/2023   GIB (gastrointestinal bleeding) 04/06/2023   Alcohol use disorder, severe, dependence (HCC) 01/17/2023   MDD (major depressive disorder), recurrent episode (HCC) 07/26/2022   Anxiety 07/26/2022   Postconcussive syndrome 07/26/2022   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 Authorizing Provider  alum & mag hydroxide-simeth (MYLANTA MAXIMUM STRENGTH) 400-400-40 MG/5ML suspension Take 15 mLs by mouth every 6 (six) hours as needed for indigestion. 05/10/23  Yes Lonell Grandchild, MD  ondansetron (ZOFRAN) 4 MG  tablet Take 1 tablet (4 mg total) by mouth every 8 (eight) hours as needed for nausea or vomiting. 05/10/23  Yes Lonell Grandchild, MD  amLODipine (NORVASC) 5 MG tablet Take 1 tablet (5 mg total) by mouth daily. 04/07/23 07/06/23  Hughie Closs, MD  omeprazole (PRILOSEC) 40 MG capsule Take 1 capsule (40 mg total) by mouth 2 (two) times daily. 05/10/23 07/09/23  Lonell Grandchild, MD                                                                                                                                    Past Surgical History Past Surgical History:  Procedure Laterality Date   BIOPSY  04/07/2023   Procedure: BIOPSY;  Surgeon: Jenel Lucks, MD;  Location: [facility] ENDOSCOPY;  Service: Gastroenterology;;   ESOPHAGOGASTRODUODENOSCOPY N/A 04/07/2023   Procedure: ESOPHAGOGASTRODUODENOSCOPY (EGD);  Surgeon: Jenel Lucks, MD;  Location: [facility] ENDOSCOPY;  Service: Gastroenterology;  Laterality: N/A;   Family History Family History  Problem Relation Age of Onset   Hypertension Mother     Social History Social History   Tobacco Use   Smoking status: Every Day    Current packs/day: 0.50    Types: Cigarettes   Smokeless tobacco: Never  Vaping Use   Vaping status: Never Used  Substance Use Topics   Alcohol use: Yes    Alcohol/week: 2.0 standard drinks of alcohol    Types: 2 Shots of liquor per week    Comment: weekends   Drug use: Not Currently    Types: Marijuana   Allergies Patient has no known allergies.  Review of Systems Review of Systems  All other systems reviewed and are negative.   Physical Exam Vital Signs  I have reviewed the triage vital signs BP 122/89   Pulse (!) 105   Temp 98.5 F (36.9 C)   Resp 18   SpO2 100%  Physical Exam Vitals and nursing note reviewed.  Constitutional:      General: He is not in acute distress.    Appearance: Normal appearance.  HENT:     Mouth/Throat:     Mouth: Mucous membranes are moist.  Eyes:      Conjunctiva/sclera: Conjunctivae normal.  Cardiovascular:     Rate and Rhythm: Normal rate and regular rhythm.  Pulmonary:     Effort: Pulmonary effort is normal. No respiratory distress.     Breath sounds: Normal breath sounds.  Abdominal:     General: Abdomen is flat.     Palpations: Abdomen is soft.     Tenderness: There is no abdominal tenderness.  Musculoskeletal:     Right lower leg: No edema.     Left lower leg: No edema.  Skin:    General: Skin is warm and dry.     Capillary Refill: Capillary refill takes less than 2 seconds.  Neurological:     Mental Status: He is alert and oriented to person, place, and time. Mental status is at baseline.  Psychiatric:        Mood and Affect: Mood normal.        Behavior: Behavior normal.     ED Results and Treatments Labs (all labs ordered are listed, but only abnormal results are displayed) Labs Reviewed  COMPREHENSIVE METABOLIC PANEL - Abnormal; Notable for the following components:      Result Value   Potassium 3.0 (*)    Glucose, Bld 153 (*)    BUN <5 (*)    Calcium 8.8 (*)    Total Bilirubin 1.3 (*)    All other components within normal limits  CBC - Abnormal; Notable for the following components:   RDW 15.6 (*)    All other components within normal limits  LIPASE, BLOOD  URINALYSIS, ROUTINE W REFLEX MICROSCOPIC                                                                                                                          Radiology No  results found.  Pertinent labs & imaging results that were available during my care of the patient were reviewed by me and considered in my medical decision making (see MDM for details).  Medications Ordered in ED Medications  sodium chloride 0.9 % bolus 1,000 mL (0 mLs Intravenous Stopped 05/10/23 1932)  ondansetron (ZOFRAN) injection 4 mg (4 mg Intravenous Given 05/10/23 1802)  alum & mag hydroxide-simeth (MAALOX/MYLANTA) 200-200-20 MG/5ML suspension 30 mL (30 mLs Oral Given  05/10/23 1756)    And  lidocaine (XYLOCAINE) 2 % viscous mouth solution 15 mL (15 mLs Oral Given 05/10/23 1756)  famotidine (PEPCID) IVPB 20 mg premix (0 mg Intravenous Stopped 05/10/23 1849)  potassium chloride SA (KLOR-CON M) CR tablet 40 mEq (40 mEq Oral Given 05/10/23 1849)                                                                                                                                     Procedures Procedures  (including critical care time)  Medical Decision Making / ED Course   MDM:  32 year old male presenting to the emergency department with nausea, vomiting abdominal pain.  Patient well-appearing, physical exam without focal abdominal tenderness.  Suspect recurrent alcoholic gastritis and esophagitis.  Patient received Maalox, Pepcid, fluids, Zofran with resolution of symptoms.  He reports he feels much better and is tolerating p.o. vital signs improved with fluids.  He reports his symptoms are very similar to when he was admitted previously and had an EGD showing esophagitis although less severe.  Doubt pancreatitis with normal lipase.  Doubt biliary pathology with no right upper quadrant tenderness nor localizing symptoms.  No other abdominal tenderness to suggest appendicitis, perforation.  Doubt mediastinitis or esophageal perforation,, patient very well-appearing,, lungs clear, no chest pain, symptoms have also been ongoing for a month intermittently.  Patient complained of some blood in vomit but hemoglobin is reassuring.  Advised alcohol cessation and strict return precautions.  Advised following up with GI as he was supposed to earlier as well as starting the PPI that was prescribed which he did not pick up.  Will discharge patient to home. All questions answered. Patient comfortable with plan of discharge. Return precautions discussed with patient and specified on the after visit summary.         Additional history obtained: -Additional history obtained  from family -External records from outside source obtained and reviewed including: Chart review including previous notes, labs, imaging, consultation notes including recent admission for similar   Lab Tests: -I ordered, reviewed, and interpreted labs.   The pertinent results include:   Labs Reviewed  COMPREHENSIVE METABOLIC PANEL - Abnormal; Notable for the following components:      Result Value   Potassium 3.0 (*)    Glucose, Bld 153 (*)    BUN <5 (*)    Calcium 8.8 (*)    Total Bilirubin 1.3 (*)    All other components within  normal limits  CBC - Abnormal; Notable for the following components:   RDW 15.6 (*)    All other components within normal limits  LIPASE, BLOOD  URINALYSIS, ROUTINE W REFLEX MICROSCOPIC    Notable for normal hemoglobin, mild hypokalemia, normal lipase   Medicines ordered and prescription drug management: Meds ordered this encounter  Medications   sodium chloride 0.9 % bolus 1,000 mL   ondansetron (ZOFRAN) injection 4 mg   AND Linked Order Group    alum & mag hydroxide-simeth (MAALOX/MYLANTA) 200-200-20 MG/5ML suspension 30 mL    lidocaine (XYLOCAINE) 2 % viscous mouth solution 15 mL   famotidine (PEPCID) IVPB 20 mg premix   potassium chloride SA (KLOR-CON M) CR tablet 40 mEq   omeprazole (PRILOSEC) 40 MG capsule    Sig: Take 1 capsule (40 mg total) by mouth 2 (two) times daily.    Dispense:  60 capsule    Refill:  1   ondansetron (ZOFRAN) 4 MG tablet    Sig: Take 1 tablet (4 mg total) by mouth every 8 (eight) hours as needed for nausea or vomiting.    Dispense:  12 tablet    Refill:  0   alum & mag hydroxide-simeth (MYLANTA MAXIMUM STRENGTH) 400-400-40 MG/5ML suspension    Sig: Take 15 mLs by mouth every 6 (six) hours as needed for indigestion.    Dispense:  355 mL    Refill:  0    -I have reviewed the patients home medicines and have made adjustments as needed    Social Determinants of Health:  Diagnosis or treatment significantly limited  by social determinants of health: alcohol use   Reevaluation: After the interventions noted above, I reevaluated the patient and found that their symptoms have improved  Co morbidities that complicate the patient evaluation  Past Medical History:  Diagnosis Date   Anxiety    Phreesia 10/21/2020   Depression    Substance abuse (HCC)    Phreesia 10/21/2020      Dispostion: Disposition decision including need for hospitalization was considered, and patient discharged from emergency department.    Final Clinical Impression(s) / ED Diagnoses Final diagnoses:  Chronic alcoholic gastritis, presence of bleeding unspecified     This chart was dictated using voice recognition software.  Despite best efforts to proofread,  errors can occur which can change the documentation meaning.    Lonell Grandchild, MD 05/10/23 [phone]

## 2023-05-12 ENCOUNTER — Telehealth: Payer: Self-pay | Admitting: Gastroenterology

## 2023-05-12 NOTE — Telephone Encounter (Signed)
There is an EGD opening on Xavier Ramsey's schedule in the [facility] for 05/25/23 at 10am. Please offer this appt.

## 2023-05-12 NOTE — Telephone Encounter (Signed)
Pt is scheduled for a endo on 06/16/23.  He has been seen in the ED a couple times and going back this morning for chest pains.  Mother is wanting to know if he can have his procedure sooner than 06/16/23.

## 2023-05-25 ENCOUNTER — Telehealth: Payer: Self-pay | Admitting: Gastroenterology

## 2023-05-25 ENCOUNTER — Encounter: Payer: No Typology Code available for payment source | Admitting: Gastroenterology

## 2023-05-25 NOTE — Telephone Encounter (Signed)
Good morning Dr. Tomasa Rand,  Patients mother called stating that she needed to cancel patients EGD with you this morning at 10:00 due to patient having issues with his leg.   Patients mother stated she will call back at a later time to reschedule.

## 2023-06-16 ENCOUNTER — Encounter: Payer: No Typology Code available for payment source | Admitting: Gastroenterology

## 2023-07-04 ENCOUNTER — Ambulatory Visit: Payer: No Typology Code available for payment source | Admitting: Gastroenterology

## 2023-08-03 ENCOUNTER — Emergency Department (HOSPITAL_COMMUNITY): Payer: MEDICAID

## 2023-08-03 ENCOUNTER — Other Ambulatory Visit: Payer: Self-pay

## 2023-08-03 ENCOUNTER — Observation Stay (HOSPITAL_COMMUNITY)
Admission: EM | Admit: 2023-08-03 | Discharge: 2023-08-04 | Disposition: A | Discharge status: Home / Self Care | Payer: MEDICAID | Attending: Emergency Medicine | Admitting: Emergency Medicine

## 2023-08-03 ENCOUNTER — Observation Stay (HOSPITAL_COMMUNITY): Payer: MEDICAID

## 2023-08-03 ENCOUNTER — Encounter (HOSPITAL_COMMUNITY): Payer: Self-pay

## 2023-08-03 DIAGNOSIS — F17210 Nicotine dependence, cigarettes, uncomplicated: Secondary | ICD-10-CM | POA: Diagnosis not present

## 2023-08-03 DIAGNOSIS — Z23 Encounter for immunization: Secondary | ICD-10-CM | POA: Insufficient documentation

## 2023-08-03 DIAGNOSIS — S27321A Contusion of lung, unilateral, initial encounter: Secondary | ICD-10-CM | POA: Insufficient documentation

## 2023-08-03 DIAGNOSIS — S2242XA Multiple fractures of ribs, left side, initial encounter for closed fracture: Principal | ICD-10-CM | POA: Insufficient documentation

## 2023-08-03 DIAGNOSIS — Z79899 Other long term (current) drug therapy: Secondary | ICD-10-CM | POA: Insufficient documentation

## 2023-08-03 DIAGNOSIS — Y9301 Activity, walking, marching and hiking: Secondary | ICD-10-CM | POA: Insufficient documentation

## 2023-08-03 DIAGNOSIS — T1490XA Injury, unspecified, initial encounter: Secondary | ICD-10-CM | POA: Diagnosis present

## 2023-08-03 DIAGNOSIS — S270XXA Traumatic pneumothorax, initial encounter: Secondary | ICD-10-CM | POA: Diagnosis not present

## 2023-08-03 LAB — I-STAT CG4 LACTIC ACID, ED: Lactic Acid, Venous: 7.4 mmol/L (ref 0.5–1.9)

## 2023-08-03 LAB — SAMPLE TO BLOOD BANK

## 2023-08-03 LAB — I-STAT CHEM 8, ED
BUN: 6 mg/dL (ref 6–20)
Calcium, Ion: 1.03 mmol/L — ABNORMAL LOW (ref 1.15–1.40)
Chloride: 103 mmol/L (ref 98–111)
Creatinine, Ser: 1.5 mg/dL — ABNORMAL HIGH (ref 0.61–1.24)
Glucose, Bld: 120 mg/dL — ABNORMAL HIGH (ref 70–99)
HCT: 49 % (ref 39.0–52.0)
Hemoglobin: 16.7 g/dL (ref 13.0–17.0)
Potassium: 4 mmol/L (ref 3.5–5.1)
Sodium: 140 mmol/L (ref 135–145)
TCO2: 21 mmol/L — ABNORMAL LOW (ref 22–32)

## 2023-08-03 LAB — RAPID URINE DRUG SCREEN, HOSP PERFORMED
Amphetamines: NOT DETECTED
Barbiturates: NOT DETECTED
Benzodiazepines: NOT DETECTED
Cocaine: NOT DETECTED
Opiates: NOT DETECTED
Tetrahydrocannabinol: POSITIVE — AB

## 2023-08-03 LAB — URINALYSIS, ROUTINE W REFLEX MICROSCOPIC
Bilirubin Urine: NEGATIVE
Glucose, UA: NEGATIVE mg/dL
Ketones, ur: 5 mg/dL — AB
Leukocytes,Ua: NEGATIVE
Nitrite: NEGATIVE
Protein, ur: 100 mg/dL — AB
Specific Gravity, Urine: >1.046 — ABNORMAL HIGH (ref 1.005–1.030)
pH: 5 (ref 5.0–8.0)

## 2023-08-03 LAB — CBC
HCT: 47.3 % (ref 39.0–52.0)
Hemoglobin: 15.9 g/dL (ref 13.0–17.0)
MCH: 32.9 pg (ref 26.0–34.0)
MCHC: 33.6 g/dL (ref 30.0–36.0)
MCV: 97.7 fL (ref 80.0–100.0)
Platelets: 275 10*3/uL (ref 150–400)
RBC: 4.84 MIL/uL (ref 4.22–5.81)
RDW: 13.3 % (ref 11.5–15.5)
WBC: 7.8 10*3/uL (ref 4.0–10.5)
nRBC: 0 % (ref 0.0–0.2)

## 2023-08-03 LAB — COMPREHENSIVE METABOLIC PANEL
ALT: 92 U/L — ABNORMAL HIGH (ref 0–44)
AST: 230 U/L — ABNORMAL HIGH (ref 15–41)
Albumin: 4.5 g/dL (ref 3.5–5.0)
Alkaline Phosphatase: 83 U/L (ref 38–126)
Anion gap: 18 — ABNORMAL HIGH (ref 5–15)
BUN: 7 mg/dL (ref 6–20)
CO2: 21 mmol/L — ABNORMAL LOW (ref 22–32)
Calcium: 9.4 mg/dL (ref 8.9–10.3)
Chloride: 101 mmol/L (ref 98–111)
Creatinine, Ser: 1.35 mg/dL — ABNORMAL HIGH (ref 0.61–1.24)
GFR, Estimated: >60 mL/min (ref >60)
Glucose, Bld: 122 mg/dL — ABNORMAL HIGH (ref 70–99)
Potassium: 4 mmol/L (ref 3.5–5.1)
Sodium: 140 mmol/L (ref 135–145)
Total Bilirubin: 1.4 mg/dL — ABNORMAL HIGH (ref <1.2)
Total Protein: 7.8 g/dL (ref 6.5–8.1)

## 2023-08-03 LAB — ETHANOL: Alcohol, Ethyl (B): 152 mg/dL — ABNORMAL HIGH (ref <10)

## 2023-08-03 LAB — PROTIME-INR
INR: 1.1 (ref 0.8–1.2)
Prothrombin Time: 14 s (ref 11.4–15.2)

## 2023-08-03 MED ORDER — ENOXAPARIN SODIUM 30 MG/0.3ML IJ SOSY: 30.0000 mg | PREFILLED_SYRINGE | Freq: Two times a day (BID) | INTRAMUSCULAR | Status: DC

## 2023-08-03 MED ORDER — LORAZEPAM 2 MG/ML IJ SOLN: 1.0000 mg | INTRAMUSCULAR | Status: DC | PRN

## 2023-08-03 MED ORDER — ONDANSETRON 4 MG PO TBDP: 4.0000 mg | ORAL_TABLET | Freq: Four times a day (QID) | ORAL | Status: DC | PRN

## 2023-08-03 MED ORDER — METHOCARBAMOL 500 MG PO TABS
500.0000 mg | ORAL_TABLET | Freq: Three times a day (TID) | ORAL | Status: DC
  Administered 2023-08-03 – 2023-08-04 (×2): 500 mg via ORAL
  Filled 2023-08-03 (×3): qty 1

## 2023-08-03 MED ORDER — HYDROMORPHONE HCL 1 MG/ML IJ SOLN
0.5000 mg | INTRAMUSCULAR | Status: DC | PRN
  Administered 2023-08-03: 0.5 mg via INTRAVENOUS
  Filled 2023-08-03: qty 1

## 2023-08-03 MED ORDER — IOHEXOL 350 MG/ML SOLN
75.0000 mL | Freq: Once | INTRAVENOUS | Status: AC | PRN
  Administered 2023-08-03: 75 mL via INTRAVENOUS

## 2023-08-03 MED ORDER — PHENOBARBITAL 32.4 MG PO TABS: 32.4000 mg | ORAL_TABLET | Freq: Three times a day (TID) | ORAL | Status: DC

## 2023-08-03 MED ORDER — HYDROMORPHONE HCL 1 MG/ML IJ SOLN
1.0000 mg | INTRAMUSCULAR | Status: DC | PRN
  Administered 2023-08-04: 1 mg via INTRAVENOUS
  Filled 2023-08-03: qty 1

## 2023-08-03 MED ORDER — THIAMINE HCL 100 MG/ML IJ SOLN
100.0000 mg | Freq: Every day | INTRAMUSCULAR | Status: DC
Start: 2023-08-04 — End: 2023-08-04

## 2023-08-03 MED ORDER — ADULT MULTIVITAMIN W/MINERALS CH
1.0000 | ORAL_TABLET | Freq: Every day | ORAL | Status: DC
  Administered 2023-08-04: 1 via ORAL
  Filled 2023-08-03: qty 1

## 2023-08-03 MED ORDER — HYDRALAZINE HCL 20 MG/ML IJ SOLN: 10.0000 mg | INTRAMUSCULAR | Status: DC | PRN

## 2023-08-03 MED ORDER — TETANUS-DIPHTH-ACELL PERTUSSIS 5-2.5-18.5 LF-MCG/0.5 IM SUSY
0.5000 mL | PREFILLED_SYRINGE | Freq: Once | INTRAMUSCULAR | Status: AC
Start: 2023-08-03 — End: 2023-08-03
  Administered 2023-08-03: 0.5 mL via INTRAMUSCULAR

## 2023-08-03 MED ORDER — KCL IN DEXTROSE-NACL 20-5-0.45 MEQ/L-%-% IV SOLN
INTRAVENOUS | Status: DC
  Filled 2023-08-03 (×3): qty 1000

## 2023-08-03 MED ORDER — METHOCARBAMOL 1000 MG/10ML IJ SOLN: 500.0000 mg | Freq: Three times a day (TID) | INTRAMUSCULAR | Status: DC

## 2023-08-03 MED ORDER — PHENOBARBITAL 32.4 MG PO TABS
97.2000 mg | ORAL_TABLET | Freq: Three times a day (TID) | ORAL | Status: DC
  Administered 2023-08-03 – 2023-08-04 (×2): 97.2 mg via ORAL
  Filled 2023-08-03 (×2): qty 3

## 2023-08-03 MED ORDER — PHENOBARBITAL 32.4 MG PO TABS: 64.8000 mg | ORAL_TABLET | Freq: Three times a day (TID) | ORAL | Status: DC

## 2023-08-03 MED ORDER — POLYETHYLENE GLYCOL 3350 17 G PO PACK: 17.0000 g | PACK | Freq: Every day | ORAL | Status: DC | PRN

## 2023-08-03 MED ORDER — HYDROMORPHONE HCL 1 MG/ML IJ SOLN
1.0000 mg | Freq: Once | INTRAMUSCULAR | Status: AC
  Administered 2023-08-03: 1 mg via INTRAVENOUS
  Filled 2023-08-03: qty 1

## 2023-08-03 MED ORDER — FOLIC ACID 1 MG PO TABS
1.0000 mg | ORAL_TABLET | Freq: Every day | ORAL | Status: DC
  Administered 2023-08-04: 1 mg via ORAL
  Filled 2023-08-03: qty 1

## 2023-08-03 MED ORDER — OXYCODONE HCL 5 MG PO TABS
5.0000 mg | ORAL_TABLET | ORAL | Status: DC | PRN
  Administered 2023-08-03: 5 mg via ORAL
  Filled 2023-08-03: qty 1

## 2023-08-03 MED ORDER — OXYCODONE HCL 5 MG PO TABS
5.0000 mg | ORAL_TABLET | ORAL | Status: DC | PRN
  Administered 2023-08-04 (×2): 10 mg via ORAL
  Filled 2023-08-03 (×2): qty 2

## 2023-08-03 MED ORDER — CALCIUM GLUCONATE-NACL 2-0.675 GM/100ML-% IV SOLN
2.0000 g | Freq: Once | INTRAVENOUS | Status: AC
  Administered 2023-08-03: 2000 mg via INTRAVENOUS
  Filled 2023-08-03: qty 100

## 2023-08-03 MED ORDER — ACETAMINOPHEN 500 MG PO TABS
1000.0000 mg | ORAL_TABLET | Freq: Four times a day (QID) | ORAL | Status: DC
  Administered 2023-08-03 – 2023-08-04 (×3): 1000 mg via ORAL
  Filled 2023-08-03 (×4): qty 2

## 2023-08-03 MED ORDER — FENTANYL CITRATE PF 50 MCG/ML IJ SOSY
50.0000 ug | PREFILLED_SYRINGE | Freq: Once | INTRAMUSCULAR | Status: AC
  Administered 2023-08-03: 50 ug via INTRAVENOUS
  Filled 2023-08-03: qty 1

## 2023-08-03 MED ORDER — LORAZEPAM 1 MG PO TABS: 1.0000 mg | ORAL_TABLET | ORAL | Status: DC | PRN

## 2023-08-03 MED ORDER — THIAMINE MONONITRATE 100 MG PO TABS
100.0000 mg | ORAL_TABLET | Freq: Every day | ORAL | Status: DC
Start: 2023-08-04 — End: 2023-08-04
  Administered 2023-08-04: 100 mg via ORAL
  Filled 2023-08-03: qty 1

## 2023-08-03 MED ORDER — DOCUSATE SODIUM 100 MG PO CAPS
100.0000 mg | ORAL_CAPSULE | Freq: Two times a day (BID) | ORAL | Status: DC
  Administered 2023-08-03: 100 mg via ORAL
  Filled 2023-08-03: qty 1

## 2023-08-03 MED ORDER — ONDANSETRON HCL 4 MG/2ML IJ SOLN: 4.0000 mg | Freq: Four times a day (QID) | INTRAMUSCULAR | Status: DC | PRN
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
## 2023-08-03 NOTE — TOC CAGE-AID Note (Signed)
Transition of Care [facility]) - CAGE-AID Screening   Patient Details  Name: Xavier Ramsey MRN: [id number] Date of Birth: 01-31-1991  [facility] of Care [facility]) CM/SW Contact:    Katha Hamming, RN Phone Number: 08/03/2023, 7:37 PM   Clinical Narrative:  States "I'm an alcoholic" reports regular alcohol use, doesn't drink on the days that he has his son. Two shots of liquor today. Denies drug use.  CAGE-AID Screening:    Have You Ever Felt You Ought to Cut Down on Your Drinking or Drug Use?: Yes Have People Annoyed You By Critizing Your Drinking Or Drug Use?: Yes Have You Felt Bad Or Guilty About Your Drinking Or Drug Use?: Yes Have You Ever Had a Drink or Used Drugs First Thing In The Morning to Steady Your Nerves or to Get Rid of a Hangover?: Yes CAGE-AID Score: 4  Substance Abuse Education Offered: Yes (declines resources)
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
## 2023-08-03 NOTE — ED Notes (Signed)
Phoned CCMD to place pt on transport monitor [id number] for transport to room.
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
## 2023-08-04 ENCOUNTER — Other Ambulatory Visit (HOSPITAL_COMMUNITY): Payer: Self-pay

## 2023-08-04 LAB — HEPATIC FUNCTION PANEL
ALT: 73 U/L — ABNORMAL HIGH (ref 0–44)
AST: 134 U/L — ABNORMAL HIGH (ref 15–41)
Albumin: 3.6 g/dL (ref 3.5–5.0)
Alkaline Phosphatase: 62 U/L (ref 38–126)
Bilirubin, Direct: 0.3 mg/dL — ABNORMAL HIGH (ref 0.0–0.2)
Indirect Bilirubin: 1.3 mg/dL — ABNORMAL HIGH (ref 0.3–0.9)
Total Bilirubin: 1.6 mg/dL — ABNORMAL HIGH (ref <1.2)
Total Protein: 6.3 g/dL — ABNORMAL LOW (ref 6.5–8.1)

## 2023-08-04 LAB — BASIC METABOLIC PANEL
Anion gap: 11 (ref 5–15)
BUN: 7 mg/dL (ref 6–20)
CO2: 24 mmol/L (ref 22–32)
Calcium: 8.7 mg/dL — ABNORMAL LOW (ref 8.9–10.3)
Chloride: 98 mmol/L (ref 98–111)
Creatinine, Ser: 0.95 mg/dL (ref 0.61–1.24)
GFR, Estimated: >60 mL/min (ref >60)
Glucose, Bld: 134 mg/dL — ABNORMAL HIGH (ref 70–99)
Potassium: 3.8 mmol/L (ref 3.5–5.1)
Sodium: 133 mmol/L — ABNORMAL LOW (ref 135–145)

## 2023-08-04 LAB — CBC
HCT: 37 % — ABNORMAL LOW (ref 39.0–52.0)
Hemoglobin: 12.9 g/dL — ABNORMAL LOW (ref 13.0–17.0)
MCH: 33 pg (ref 26.0–34.0)
MCHC: 34.9 g/dL (ref 30.0–36.0)
MCV: 94.6 fL (ref 80.0–100.0)
Platelets: 211 10*3/uL (ref 150–400)
RBC: 3.91 MIL/uL — ABNORMAL LOW (ref 4.22–5.81)
RDW: 13.1 % (ref 11.5–15.5)
WBC: 9.2 10*3/uL (ref 4.0–10.5)
nRBC: 0 % (ref 0.0–0.2)

## 2023-08-04 LAB — HIV ANTIBODY (ROUTINE TESTING W REFLEX): HIV Screen 4th Generation wRfx: NONREACTIVE

## 2023-08-04 LAB — PHOSPHORUS: Phosphorus: 3.8 mg/dL (ref 2.5–4.6)

## 2023-08-04 LAB — MAGNESIUM: Magnesium: 1.5 mg/dL — ABNORMAL LOW (ref 1.7–2.4)

## 2023-08-04 MED ORDER — SPIRITUS FRUMENTI
1.0000 | Freq: Three times a day (TID) | ORAL | Status: DC
  Filled 2023-08-04 (×3): qty 1

## 2023-08-04 MED ORDER — METHOCARBAMOL 500 MG PO TABS
500.0000 mg | ORAL_TABLET | Freq: Three times a day (TID) | ORAL | 0 refills | Status: DC | PRN
  Filled 2023-08-04: qty 50, 17d supply, fill #0

## 2023-08-04 MED ORDER — ACETAMINOPHEN 500 MG PO TABS: 1000.0000 mg | ORAL_TABLET | Freq: Four times a day (QID) | ORAL | Status: DC | PRN

## 2023-08-04 MED ORDER — HYDROMORPHONE HCL 1 MG/ML IJ SOLN: 0.5000 mg | INTRAMUSCULAR | Status: DC | PRN

## 2023-08-04 MED ORDER — OXYCODONE HCL 5 MG PO TABS
5.0000 mg | ORAL_TABLET | ORAL | 0 refills | Status: DC | PRN
  Filled 2023-08-04: qty 30, 3d supply, fill #0
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
## 2023-08-04 NOTE — Plan of Care (Signed)
  Problem: Education: Goal: Knowledge of General Education information will improve Description: Including pain rating scale, medication(s)/side effects and non-pharmacologic comfort measures 08/04/2023 1241 by Dannielle Burn, RN Outcome: Adequate for Discharge 08/04/2023 0919 by Dannielle Burn, RN Outcome: Progressing   Problem: Health Behavior/Discharge Planning: Goal: Ability to manage health-related needs will improve 08/04/2023 1241 by Dannielle Burn, RN Outcome: Adequate for Discharge 08/04/2023 0919 by Dannielle Burn, RN Outcome: Progressing   Problem: Clinical Measurements: Goal: Ability to maintain clinical measurements within normal limits will improve 08/04/2023 1241 by Dannielle Burn, RN Outcome: Adequate for Discharge 08/04/2023 0919 by Dannielle Burn, RN Outcome: Progressing Goal: Will remain free from infection 08/04/2023 1241 by Dannielle Burn, RN Outcome: Adequate for Discharge 08/04/2023 0919 by Dannielle Burn, RN Outcome: Progressing Goal: Diagnostic test results will improve 08/04/2023 1241 by Dannielle Burn, RN Outcome: Adequate for Discharge 08/04/2023 0919 by Dannielle Burn, RN Outcome: Progressing Goal: Respiratory complications will improve 08/04/2023 1241 by Dannielle Burn, RN Outcome: Adequate for Discharge 08/04/2023 0919 by Dannielle Burn, RN Outcome: Progressing Goal: Cardiovascular complication will be avoided 08/04/2023 1241 by Dannielle Burn, RN Outcome: Adequate for Discharge 08/04/2023 0919 by Dannielle Burn, RN Outcome: Progressing   Problem: Activity: Goal: Risk for activity intolerance will decrease 08/04/2023 1241 by Dannielle Burn, RN Outcome: Adequate for Discharge 08/04/2023 0919 by Dannielle Burn, RN Outcome: Progressing   Problem: Nutrition: Goal: Adequate nutrition will be maintained 08/04/2023 1241 by Dannielle Burn, RN Outcome: Adequate for Discharge 08/04/2023 0919 by Dannielle Burn, RN Outcome: Progressing   Problem: Coping: Goal: Level of anxiety will decrease 08/04/2023 1241 by Dannielle Burn, RN Outcome: Adequate for Discharge 08/04/2023 0919 by Dannielle Burn, RN Outcome: Progressing   Problem: Elimination: Goal: Will not experience complications related to bowel motility 08/04/2023 1241 by Dannielle Burn, RN Outcome: Adequate for Discharge 08/04/2023 0919 by Dannielle Burn, RN Outcome: Progressing Goal: Will not experience complications related to urinary retention 08/04/2023 1241 by Dannielle Burn, RN Outcome: Adequate for Discharge 08/04/2023 0919 by Dannielle Burn, RN Outcome: Progressing   Problem: Pain Management: Goal: General experience of comfort will improve 08/04/2023 1241 by Dannielle Burn, RN Outcome: Adequate for Discharge 08/04/2023 0919 by Dannielle Burn, RN Outcome: Progressing   Problem: Safety: Goal: Ability to remain free from injury will improve 08/04/2023 1241 by Dannielle Burn, RN Outcome: Adequate for Discharge 08/04/2023 0919 by Dannielle Burn, RN Outcome: Progressing   Problem: Skin Integrity: Goal: Risk for impaired skin integrity will decrease 08/04/2023 1241 by Dannielle Burn, RN Outcome: Adequate for Discharge 08/04/2023 0919 by Dannielle Burn, RN Outcome: Progressing
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
## 2023-08-04 NOTE — Plan of Care (Signed)

## 2023-08-04 NOTE — Progress Notes (Addendum)
On call paged this shift re: severe pain unrelieved after transfer to unit, see emar for one time Dilaudid - otherwise prn pain regiment adequate  Conveyed to MD pt states drinks 1 pint to 1/5 of liquor every day/every other day. Phenobarb taper added w/ CIWA  UOP adequate for intake, fluids encouraged, pt not drinking much at this time, is fatigued/lethargic, continues to ask for more sleeping medication - pt admitted after taking a reported 3 pills of ambien with alcohol (0615: Pt slept well when observed during rounding)  Pt notified mother of his admission, mother at the bedside for visit this shift

## 2023-09-24 ENCOUNTER — Ambulatory Visit (HOSPITAL_COMMUNITY)
Admission: EM | Admit: 2023-09-24 | Discharge: 2023-09-28 | Payer: MEDICAID | Attending: Psychiatry | Admitting: Psychiatry

## 2023-09-24 DIAGNOSIS — F332 Major depressive disorder, recurrent severe without psychotic features: Secondary | ICD-10-CM | POA: Diagnosis not present

## 2023-09-24 DIAGNOSIS — F1994 Other psychoactive substance use, unspecified with psychoactive substance-induced mood disorder: Secondary | ICD-10-CM

## 2023-09-24 DIAGNOSIS — Z560 Unemployment, unspecified: Secondary | ICD-10-CM | POA: Insufficient documentation

## 2023-09-24 DIAGNOSIS — F4310 Post-traumatic stress disorder, unspecified: Secondary | ICD-10-CM | POA: Diagnosis not present

## 2023-09-24 DIAGNOSIS — F419 Anxiety disorder, unspecified: Secondary | ICD-10-CM | POA: Insufficient documentation

## 2023-09-24 DIAGNOSIS — Z79899 Other long term (current) drug therapy: Secondary | ICD-10-CM | POA: Insufficient documentation

## 2023-09-24 DIAGNOSIS — F17210 Nicotine dependence, cigarettes, uncomplicated: Secondary | ICD-10-CM | POA: Insufficient documentation

## 2023-09-24 DIAGNOSIS — Y906 Blood alcohol level of 120-199 mg/100 ml: Secondary | ICD-10-CM | POA: Insufficient documentation

## 2023-09-24 DIAGNOSIS — I456 Pre-excitation syndrome: Secondary | ICD-10-CM | POA: Insufficient documentation

## 2023-09-24 DIAGNOSIS — F1010 Alcohol abuse, uncomplicated: Secondary | ICD-10-CM

## 2023-09-24 DIAGNOSIS — F1014 Alcohol abuse with alcohol-induced mood disorder: Secondary | ICD-10-CM | POA: Insufficient documentation

## 2023-09-24 LAB — CBC WITH DIFFERENTIAL/PLATELET
Abs Immature Granulocytes: 0.01 10*3/uL (ref 0.00–0.07)
Basophils Absolute: 0 10*3/uL (ref 0.0–0.1)
Basophils Relative: 1 %
Eosinophils Absolute: 0 10*3/uL (ref 0.0–0.5)
Eosinophils Relative: 0 %
HCT: 44.4 % (ref 39.0–52.0)
Hemoglobin: 15.8 g/dL (ref 13.0–17.0)
Immature Granulocytes: 0 %
Lymphocytes Relative: 53 %
Lymphs Abs: 2.5 10*3/uL (ref 0.7–4.0)
MCH: 32 pg (ref 26.0–34.0)
MCHC: 35.6 g/dL (ref 30.0–36.0)
MCV: 89.9 fL (ref 80.0–100.0)
Monocytes Absolute: 0.4 10*3/uL (ref 0.1–1.0)
Monocytes Relative: 9 %
Neutro Abs: 1.7 10*3/uL (ref 1.7–7.7)
Neutrophils Relative %: 37 %
Platelets: 313 10*3/uL (ref 150–400)
RBC: 4.94 MIL/uL (ref 4.22–5.81)
RDW: 13.3 % (ref 11.5–15.5)
WBC: 4.7 10*3/uL (ref 4.0–10.5)
nRBC: 0 % (ref 0.0–0.2)

## 2023-09-24 LAB — POCT URINE DRUG SCREEN - MANUAL ENTRY (I-SCREEN)
POC Amphetamine UR: NOT DETECTED
POC Buprenorphine (BUP): NOT DETECTED
POC Cocaine UR: NOT DETECTED
POC Marijuana UR: NOT DETECTED
POC Methadone UR: NOT DETECTED
POC Methamphetamine UR: NOT DETECTED
POC Morphine: NOT DETECTED
POC Oxazepam (BZO): NOT DETECTED
POC Oxycodone UR: NOT DETECTED
POC Secobarbital (BAR): NOT DETECTED

## 2023-09-24 LAB — COMPREHENSIVE METABOLIC PANEL
ALT: 39 U/L (ref 0–44)
AST: 51 U/L — ABNORMAL HIGH (ref 15–41)
Albumin: 4.5 g/dL (ref 3.5–5.0)
Alkaline Phosphatase: 124 U/L (ref 38–126)
Anion gap: 10 (ref 5–15)
BUN: 8 mg/dL (ref 6–20)
CO2: 28 mmol/L (ref 22–32)
Calcium: 10 mg/dL (ref 8.9–10.3)
Chloride: 103 mmol/L (ref 98–111)
Creatinine, Ser: 0.94 mg/dL (ref 0.61–1.24)
GFR, Estimated: >60 mL/min (ref >60)
Glucose, Bld: 87 mg/dL (ref 70–99)
Potassium: 4.3 mmol/L (ref 3.5–5.1)
Sodium: 141 mmol/L (ref 135–145)
Total Bilirubin: 1.4 mg/dL — ABNORMAL HIGH (ref 0.0–1.2)
Total Protein: 7.8 g/dL (ref 6.5–8.1)

## 2023-09-24 LAB — LIPID PANEL
Cholesterol: 127 mg/dL (ref 0–200)
HDL: 63 mg/dL (ref >40)
LDL Cholesterol: 3 mg/dL (ref 0–99)
Total CHOL/HDL Ratio: 2 {ratio}
Triglycerides: 303 mg/dL — ABNORMAL HIGH (ref <150)
VLDL: 61 mg/dL — ABNORMAL HIGH (ref 0–40)

## 2023-09-24 LAB — HEMOGLOBIN A1C
Hgb A1c MFr Bld: 5.2 % (ref 4.8–5.6)
Mean Plasma Glucose: 102.54 mg/dL

## 2023-09-24 LAB — TSH: TSH: 1.228 u[IU]/mL (ref 0.350–4.500)

## 2023-09-24 LAB — ETHANOL: Alcohol, Ethyl (B): 179 mg/dL — ABNORMAL HIGH (ref <10)

## 2023-09-24 MED ORDER — TRAZODONE HCL 50 MG PO TABS
50.0000 mg | ORAL_TABLET | Freq: Every evening | ORAL | Status: DC | PRN
  Administered 2023-09-24 – 2023-09-26 (×3): 50 mg via ORAL
  Filled 2023-09-24 (×3): qty 1

## 2023-09-24 MED ORDER — ADULT MULTIVITAMIN W/MINERALS CH
1.0000 | ORAL_TABLET | Freq: Every day | ORAL | Status: DC
  Administered 2023-09-24 – 2023-09-28 (×5): 1 via ORAL
  Filled 2023-09-24 (×5): qty 1

## 2023-09-24 MED ORDER — THIAMINE HCL 100 MG/ML IJ SOLN
100.0000 mg | Freq: Once | INTRAMUSCULAR | Status: AC
  Administered 2023-09-24: 100 mg via INTRAMUSCULAR
  Filled 2023-09-24: qty 2

## 2023-09-24 MED ORDER — MAGNESIUM HYDROXIDE 400 MG/5ML PO SUSP
30.0000 mL | Freq: Every day | ORAL | Status: DC | PRN
Start: 2023-09-24 — End: 2023-09-28

## 2023-09-24 MED ORDER — LOPERAMIDE HCL 2 MG PO CAPS: 2.0000 mg | ORAL_CAPSULE | ORAL | Status: AC | PRN

## 2023-09-24 MED ORDER — LORAZEPAM 1 MG PO TABS
1.0000 mg | ORAL_TABLET | Freq: Four times a day (QID) | ORAL | Status: AC | PRN
Start: 2023-09-24 — End: 2023-09-27
  Administered 2023-09-24: 1 mg via ORAL
  Filled 2023-09-24: qty 1

## 2023-09-24 MED ORDER — ACETAMINOPHEN 325 MG PO TABS
650.0000 mg | ORAL_TABLET | Freq: Four times a day (QID) | ORAL | Status: DC | PRN
  Administered 2023-09-24 – 2023-09-26 (×3): 650 mg via ORAL
  Filled 2023-09-24 (×3): qty 2

## 2023-09-24 MED ORDER — HYDROXYZINE HCL 25 MG PO TABS
25.0000 mg | ORAL_TABLET | Freq: Four times a day (QID) | ORAL | Status: AC | PRN
  Administered 2023-09-26: 25 mg via ORAL
  Filled 2023-09-24: qty 1

## 2023-09-24 MED ORDER — ONDANSETRON 4 MG PO TBDP: 4.0000 mg | ORAL_TABLET | Freq: Four times a day (QID) | ORAL | Status: AC | PRN

## 2023-09-24 MED ORDER — THIAMINE MONONITRATE 100 MG PO TABS
100.0000 mg | ORAL_TABLET | Freq: Every day | ORAL | Status: DC
  Administered 2023-09-25 – 2023-09-28 (×4): 100 mg via ORAL
  Filled 2023-09-24 (×4): qty 1

## 2023-09-24 MED ORDER — ALUM & MAG HYDROXIDE-SIMETH 200-200-20 MG/5ML PO SUSP
30.0000 mL | ORAL | Status: DC | PRN
Start: 2023-09-24 — End: 2023-09-28

## 2023-09-24 NOTE — ED Notes (Signed)
 Patient is sleeping. Respirations equal and unlabored, skin warm and dry. No change in assessment or acuity. Routine safety checks conducted according to facility protocol. Will continue to monitor for safety.

## 2023-09-24 NOTE — BH Assessment (Signed)
 Comprehensive Clinical Assessment (CCA) Note   09/24/2023 Xavier Ramsey [id number]  Disposition: Xavier Show, NP recommends inpatient hospitalization.   The patient demonstrates the following risk factors for suicide: Chronic risk factors for suicide include: substance use disorder. Acute risk factors for suicide include: unemployment. Protective factors for this patient include: positive social support. Considering these factors, the overall suicide risk at this point appears to be low. Patient is not appropriate for outpatient follow up.   Pt is 33 yo male who presents  [facility] voluntarily, accompanied by his mother requesting alcohol  detox. Pt reports that he recieved inpatient treatment at [facility] in May 2024 and his drinking has decrease but he is still struggling with his drinking and mental health. Pt reports that he has hx of Depression, PTSD, and Anxiety Disorder. Pt reports that he is currently drinking a 1/5 of liquoe and 8-10 beers approx. 3-4 days. Pt reports that he has been drinking today. Pt denies SI, HI, and AVH.   Chief Complaint:  Chief Complaint  Patient presents with   Addiction Problem   Visit Diagnosis:  Depression Anxiety  Alcohol  Use Disorder     CCA Screening, Triage and Referral (STR)  Patient Reported Information How did you hear about us ? Self  What Is the Reason for Your Visit/Call Today? Pt is 33 yo male who presents  [facility] voluntarily, accompanied by his mother requesting alcohol  detox. Pt reports that he recieved inpatient treatment at [facility] in May 2024 and his drinking has decrease but he is still struggling with his drinking and mental health. Pt reports that he has hx of Depression, PTSD, and Anxiety Disorder. Pt reports that he is currently drinking a 1/5 of liquoe and 8-10 beers approx. 3-4 days. Pt reports that he has been drinking today. Pt denies SI, HI, and AVH.  How Long Has This Been Causing You Problems? > than 6 months  What Do You Feel  Would Help You the Most Today? Alcohol  or Drug Use Treatment; Medication(s); Treatment for Depression or other mood problem   Have You Recently Had Any Thoughts About Hurting Yourself? No  Are You Planning to Commit Suicide/Harm Yourself At This time? No   Flowsheet Row ED from 09/24/2023 in [facility] ED to Hosp-Admission (Discharged) from 08/03/2023 in [place] [facility] from 05/10/2023 in [facility] Department [facility]  C-SSRS RISK CATEGORY No Risk No Risk No Risk       Have you Recently Had Thoughts About Hurting Someone [place]? No  Are You Planning to Harm Someone at This Time? No  Explanation: Pt denies HI.   Have You Used Any Alcohol  or Drugs in the Past 24 Hours? Yes  What Did You Use and How Much? Alcohol   8-10 beers   Do You Currently Have a Therapist/Psychiatrist? No  Name of Therapist/Psychiatrist: Name of Therapist/Psychiatrist: Pt reports no outpatient services   Have You Been Recently Discharged From Any Office Practice or Programs? No  Explanation of Discharge From Practice/Program: N/A     CCA Screening Triage Referral Assessment Type of Contact: Face-to-Face  Telemedicine Service Delivery:   Is this Initial or Reassessment?   Date Telepsych consult ordered in CHL:    Time Telepsych consult ordered in CHL:    Location of Assessment: [facility] Assessment Services  Provider Location: [facility] Assessment Services   Collateral Involvement: None   Does Patient Have a Automotive Engineer Guardian? No  Legal Guardian  Contact Information: n/a  Copy of Legal Guardianship Form: -- (n/a)  Legal Guardian Notified of Arrival: -- (n/a)  Legal Guardian Notified of Pending Discharge: -- (n/a)  If Minor and Not Living with Parent(s), Who has Custody? n/a  Is CPS involved or ever been involved? Never  Is APS involved or ever been involved? Never   Patient Determined To Be At Risk  for Harm To Self or Others Based on Review of Patient Reported Information or Presenting Complaint? No  Method: No Plan  Availability of Means: No access or NA  Intent: Vague intent or NA  Notification Required: No need or identified person  Additional Information for Danger to Others Potential: -- (n/a)  Additional Comments for Danger to Others Potential: n/a  Are There Guns or Other Weapons in Your Home? No  Types of Guns/Weapons: Pt denies access to guns/weapons  Are These Weapons Safely Secured?                            No  Who Could Verify You Are Able To Have These Secured: n/a  Do You Have any Outstanding Charges, Pending Court Dates, Parole/Probation? Pt denies pending legal charges  Contacted To Inform of Risk of Harm To Self or Others: -- (n/a)    Does Patient Present under Involuntary Commitment? No    [place] of Residence: Xavier Ramsey   Patient Currently Receiving the Following Services: Not Receiving Services   Determination of Need: Urgent (48 hours)   Options For Referral: Facility-Based Crisis     CCA Biopsychosocial Patient Reported Schizophrenia/Schizoaffective Diagnosis in Past: No   Strengths: Patient seeking treatment for alcohol  abuse   Mental Health Symptoms Depression:  Change in energy/activity; Hopelessness   Duration of Depressive symptoms: Duration of Depressive Symptoms: Greater than two weeks   Mania:  None   Anxiety:   Restlessness; Worrying   Psychosis:  None   Duration of Psychotic symptoms:    Trauma:  None   Obsessions:  None   Compulsions:  None   Inattention:  None   Hyperactivity/Impulsivity:  None   Oppositional/Defiant Behaviors:  None   Emotional Irregularity:  None   Other Mood/Personality Symptoms:  N/A    Mental Status Exam Appearance and self-care  Stature:  Average   Weight:  Average weight   Clothing:  Casual   Grooming:  Normal   Cosmetic use:  None   Posture/gait:  Normal    Motor activity:  Not Remarkable   Sensorium  Attention:  Normal   Concentration:  Normal   Orientation:  X5   Recall/memory:  Normal   Affect and Mood  Affect:  Appropriate   Mood:  Euthymic   Relating  Eye contact:  Normal   Facial expression:  Responsive   Attitude toward examiner:  Cooperative   Thought and Language  Speech flow: Normal   Thought content:  Appropriate to Mood and Circumstances   Preoccupation:  None   Hallucinations:  None   Organization:  Goal-directed; Linear; Coherent   Executive Iac/interactivecorp of Knowledge:  Good   Intelligence:  Average   Abstraction:  Normal   Judgement:  Good   Reality Testing:  Realistic   Insight:  Good   Decision Making:  Normal   Social Functioning  Social Maturity:  Responsible   Social Judgement:  Normal   Stress  Stressors:  Other (Comment) (Patient denies current stressors)   Coping Ability:  Normal  Skill Deficits:  None   Supports:  Family     Religion: Religion/Spirituality Are You A Religious Person?: Yes What is Your Religious Affiliation?: Christian How Might This Affect Treatment?: N/A  Leisure/Recreation: Leisure / Recreation Do You Have Hobbies?: No  Exercise/Diet: Exercise/Diet Do You Exercise?: No Have You Gained or Lost A Significant Amount of Weight in the Past Six Months?: No Do You Follow a Special Diet?: No Do You Have Any Trouble Sleeping?: Yes Explanation of Sleeping Difficulties: Pt reports having a difficult time falling asleep at night   CCA Employment/Education Employment/Work Situation: Employment / Work Situation Employment Situation: Unemployed Patient's Job has Been Impacted by Current Illness: No Has Patient ever Been in Equities Trader?: No  Education: Education Is Patient Currently Attending School?: No Last Grade Completed: 12 Did You Product Manager?: Yes What Type of College Degree Do you Have?: UTA Did You Have An Individualized  Education Program (IIEP): No Did You Have Any Difficulty At School?: No Patient's Education Has Been Impacted by Current Illness: No   CCA Family/Childhood History Family and Relationship History: Family history Marital status: Single Does patient have children?: Yes How many children?: 1 How is patient's relationship with their children?: Pt reports that she is an active father.  Childhood History:  Childhood History By whom was/is the patient raised?: Mother Did patient suffer any verbal/emotional/physical/sexual abuse as a child?: No Did patient suffer from severe childhood neglect?: No Has patient ever been sexually abused/assaulted/raped as an adolescent or adult?: No Was the patient ever a victim of a crime or a disaster?: No Witnessed domestic violence?: No Has patient been affected by domestic violence as an adult?: No       CCA Substance Use Alcohol /Drug Use: Alcohol  / Drug Use Pain Medications: See MAR Prescriptions: See MAR Over the Counter: See MAR History of alcohol  / drug use?: Yes Longest period of sobriety (when/how long): UTA Negative Consequences of Use: Personal relationships, Financial Withdrawal Symptoms: Sweats, Nausea / Vomiting, Tremors Substance #1 Name of Substance 1: ETOH 1 - Age of First Use: UTA 1 - Amount (size/oz): 1/5 liquor and 8-10 beers 1 - Frequency: 3-4 times per week 1 - Duration: UTA 1 - Last Use / Amount: 09/24/23 1 - Method of Aquiring: UTA 1- Route of Use: UTA                       ASAM's:  Six Dimensions of Multidimensional Assessment  Dimension 1:  Acute Intoxication and/or Withdrawal Potential:   Dimension 1:  Description of individual's past and current experiences of substance use and withdrawal: Patient reports withdrawal symptoms to include shaking, sweaking, and sickness/nausea.  Dimension 2:  Biomedical Conditions and Complications:   Dimension 2:  Description of patient's biomedical conditions and   complications: Patient reports an old head injury. Denies additional health or physical conditions.  Dimension 3:  Emotional, Behavioral, or Cognitive Conditions and Complications:  Dimension 3:  Description of emotional, behavioral, or cognitive conditions and complications: Patient has a diagnosis of MDD, anxiety and mood disorder.  Dimension 4:  Readiness to Change:  Dimension 4:  Description of Readiness to Change criteria: Patient interested in engaging in long-term treatment  Dimension 5:  Relapse, Continued use, or Continued Problem Potential:  Dimension 5:  Relapse, continued use, or continued problem potential critiera description: Patient was sober 1 month, 1 week before relapsing  Dimension 6:  Recovery/Living Environment:  Dimension 6:  Recovery/Iiving environment criteria description: Patient lives  with his mother who is supportive  ASAM Severity Score: ASAM's Severity Rating Score: 4  ASAM Recommended Level of Treatment: ASAM Recommended Level of Treatment: Level I Outpatient Treatment   Substance use Disorder (SUD) Substance Use Disorder (SUD)  Checklist Symptoms of Substance Use: Continued use despite persistent or recurrent social, interpersonal problems, caused or exacerbated by use, Persistent desire or unsuccessful efforts to cut down or control use, Recurrent use that results in a failure to fulfill major role obligations (work, school, home)  Recommendations for Services/Supports/Treatments: Recommendations for Services/Supports/Treatments Recommendations For Services/Supports/Treatments: Facility Based Crisis  Disposition Recommendation per psychiatric provider: We recommend inpatient psychiatric hospitalization.  DSM5 Diagnoses: Patient Active Problem List   Diagnosis Date Noted   Trauma 08/03/2023   Epigastric pain 04/07/2023   Acute esophagitis 04/07/2023   Gastritis and gastroduodenitis 04/07/2023   Acute upper GI bleed 04/06/2023   Nausea & vomiting 04/06/2023    AKI (acute kidney injury) (HCC) 04/06/2023   Transaminitis 04/06/2023   Leukocytosis 04/06/2023   Chronic alcohol  abuse 04/06/2023   Tobacco abuse 04/06/2023   GIB (gastrointestinal bleeding) 04/06/2023   Alcohol  use disorder, severe, dependence (HCC) 01/17/2023   MDD (major depressive disorder), recurrent episode (HCC) 07/26/2022   Anxiety 07/26/2022   Postconcussive syndrome 07/26/2022   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

## 2023-09-25 MED ORDER — CHLORDIAZEPOXIDE HCL 25 MG PO CAPS
25.0000 mg | ORAL_CAPSULE | Freq: Three times a day (TID) | ORAL | Status: AC
  Administered 2023-09-26 (×3): 25 mg via ORAL
  Filled 2023-09-25 (×3): qty 1

## 2023-09-25 MED ORDER — CHLORDIAZEPOXIDE HCL 25 MG PO CAPS
25.0000 mg | ORAL_CAPSULE | ORAL | Status: AC
  Administered 2023-09-27 (×2): 25 mg via ORAL
  Filled 2023-09-25 (×2): qty 1

## 2023-09-25 MED ORDER — CHLORDIAZEPOXIDE HCL 25 MG PO CAPS
25.0000 mg | ORAL_CAPSULE | Freq: Four times a day (QID) | ORAL | Status: AC
  Administered 2023-09-25 (×4): 25 mg via ORAL
  Filled 2023-09-25 (×4): qty 1

## 2023-09-25 MED ORDER — SERTRALINE HCL 25 MG PO TABS
25.0000 mg | ORAL_TABLET | Freq: Every day | ORAL | Status: DC
  Administered 2023-09-25 – 2023-09-28 (×4): 25 mg via ORAL
  Filled 2023-09-25 (×4): qty 1
  Filled 2023-09-25: qty 14

## 2023-09-25 MED ORDER — CHLORDIAZEPOXIDE HCL 25 MG PO CAPS: 25.0000 mg | ORAL_CAPSULE | Freq: Every day | ORAL | Status: DC

## 2023-09-25 NOTE — Progress Notes (Signed)
 Patient has been awake during the day shift hours.  He has spent his time in the day room watching television.  He has been calm and cooperative with care.  He received PRN Tylenol  650 mg PO earlier due to complaint of a headache and reported headache gone on reassessment.

## 2023-09-25 NOTE — ED Notes (Signed)
 Pt eating lunch

## 2023-09-25 NOTE — Discharge Instructions (Addendum)
 Patient will be discharging to [facility] on Monday 09/28/2023 by 9:00am with transportation provided via Taxi. Address is [address] [place], [place] [address].    [facility] [address], [place], [address] [phone] phone  New Patient Assessment/Therapy Walk-Ins:  Monday and Wednesday: 8 am until slots are full. Every 1st and 2nd Fridays of the month: 1 pm - 5 pm.  NO ASSESSMENT/THERAPY WALK-INS ON TUESDAYS OR THURSDAYS  New Patient Assessment/Medication Management Walk-Ins:  Monday - Friday:  8 am - 11 am.  For all walk-ins, we ask that you arrive by 7:30 am because patients will be seen in the order of arrival.  Availability is limited; therefore, you may not be seen on the same day that you walk-in.  Our goal is to serve and meet the needs of our community to the best of our [place] ability.  SUBSTANCE USE TREATMENT for Medicaid and State Funded/IPRS  [facility] [address] [place] [address], [place], [address] [phone] phone NOTE: [facility] is no longer offering IOP services.  Serves those who are low-income or have no insurance.  [facility] [address] [place], [place], [address] [phone] phone [phone] fax NOTE: Does have Substance Abuse-Intensive Outpatient Program [facility]) as well as transitional housing if eligible.  [facility] [address] [place], [place], [address] [phone] phone [phone] fax  [facility] [phone] [address] [place], [place], [address] [phone] phone [phone] fax  HALFWAY HOUSES:  [facility] [phone]  [employer].oxfordvacancies.com  12 STEP PROGRAMS:  Alcoholics Anonymous of [place] softwarechalet.be  Narcotics Anonymous of [place] hitprotect.dk  Al-Anon of [place], [place] www.[place]alanon.org/find-meetings.html  Nar-Anon  https://nar-anon.org/find-a-meetin  List of Residential placements:   [facility] in [place]: [phone]  [facility]: [phone]  [place], [place] [phone]: Male and male facility; 30-day program: (uninsured and Medicaid such as [place], [place], [name], partners)  [facility]: [phone]; men and women's facility; 28 days; Can have Medicaid tailored plan Tour Manager or Partners)  [facility]: [phone] [place] or [place]; 28 day program; must be fully detox; tailored Medicaid or no insurance  [address] in [place], [place]; [phone]; 28 day all males program; no insurance accepted  [facility] Referral in [place]: [place] [phone] (no insurance or Medicaid only); 90 days; outpatient services but provide housing in apartments downtown [place]  [facility] Admission: [phone]: Patient must complete phone screening for placement: [place], [place]; 6 month program; uninsured, Medicaid, and Western & southern financial.   [facility]: no insurance required; [phone]  [facility]: [phone]; Intake: [place]; Must fill out application online; Steffan Delay [phone] x [address] [facility] in [place], [place]: [phone]; Admissions Coordinators Mr. Marinda or Alm Eagles; 90 day program.  [facility]: [place], [place] [phone]; Co-Ed 9 month to a year program; Online application; Men entry fee is $500 (6-65months);  [employer]: [address] [place], [place] [address]; no fee or insurance required; minimum of 2 years; Highly structured; work based; Intake Coordinator is [place] [phone]  [facility] in [place], [place]: [phone]; Fax number is [phone]; website: www.[facility].org; Requires 3-6 page autobiography; 2 year program (18 months and then 63month transitional housing); Admission fee is $300; no insurance needed; work  Automotive Engineer in [place], [place]: [employer] Desk Staff: [place] [phone]: They have a Men's Regenerations Program 6-27months. Free program; There is an initial $300 fee however, they  are willing to work with patients regarding that. Application is online.  [facility]: Admissions [phone] [place] [phone]; Any 7-90 day program is out of pocket; 12 month program is free of charge; there is a $275 entry fee; Patient is responsible for own transportation

## 2023-09-25 NOTE — Tx Team (Signed)
 LCSW and Resident met with patient to assess current mood, affect, physical state, and inquire about needs/goals while here in [facility] and after discharge. Patient reports he presented due to needing help with detox and find residential placement. Patient reports being admitted into the [facility] back in May of 2024. Patient reports successfully completing the residential program at [facility] and reports maintaining sobriety for about three months after discharge. Patient reports he then went around the same environment and returned to use. Patient reports he currently struggles with alcohol  and reports drinking about a 5th of alcohol  a day. Patient reports family is a motivating factor for change. Patient denies having any current withdrawals. Patient reports he lives at home with his mother. Patient reports having a good deal of support from family including his grandparents. Patient at this time denies SI/HI/AVH. Patient reports at this time he would like to look into long-term residential placement. Patient reports a need to really get away from his environment and create a fresh start for himself. LCSW discussed a few options with the patient and patient reports he wants to weigh all options at this time.  Patient aware that LCSW will send referrals out for review and will follow up to provide updates as received. Patient expressed understanding and appreciation of LCSW assistance. No other needs were reported at this time by patient.    Patient has [place] Medicaid [employer] of [place]. Referral has been sent to [facility] for review. Patient will also be provided a list of longer term facilities that he can follow up with regarding bed availability. LCSW will follow up to provide updates as received.    Merlynn Lazier, LCSW Clinical Social Worker [facility] Ph: [phone]

## 2023-09-25 NOTE — ED Notes (Signed)
 Pt is in the dayroom watching TV. Pt denies SI/HI/AVH. Pt has no further complain.No acute distress noted. Will continue to monitor for safety and provide support.

## 2023-09-25 NOTE — ED Notes (Signed)
 Patient is sleeping. Respirations equal and unlabored, skin warm and dry. No change in assessment or acuity. Routine safety checks conducted according to facility protocol. Will continue to monitor for safety.

## 2023-09-25 NOTE — ED Notes (Signed)
 Pt awake and alert.  Flat affect that brightens with engagement.  Mood Congruent.  Denies SI, HI or AVH.  Pt visible in milieu and ate breakfast.  Currently attending AA group.  No distress noted.  Staff will cont to monitor for safety.

## 2023-09-25 NOTE — ED Provider Notes (Signed)
 Behavioral Health Progress Note  Date and Time: 09/25/2023 8:29 AM Name: Xavier Ramsey MRN:  [id number]  Subjective:  Xavier Ramsey is a 33 year old male with reported psychiatric history of alcohol  abuse, PTSD, and depression. He presented voluntarily to [facility] reporting depressive symptoms and requesting alcohol  detox.   Patient evaluated during treatment team. [facility] with transition to [facility] 30 day program. He reports he completed [facility] and remained sober for 3 months before he relapsed. Reports his son motivated him to stay sober for those 3 months. He states  I got caught up in the same routine from before and relapsed, doing the old things that I used to do. Patient reports he is living in [place] with his mom, who is a source of support. He is unemployed. He reports his mother drove him to the [facility]. He reports he has a DUI court date, he is unable to recall but reports it was some months ago.   Patient admits he has been resistant to being on medications in the past.   He had some therapy sessions in the past, but has not had outpatient follow up since last April of last year. Patient's goal once stabilized is following through with residential rehabilitation for at least 1 year. Reports sleep and appetite have been adequate. He is experiencing some depression, rates it 5/10 today, where 10 is the worst. He notes no significant cravings or withdrawals.   On interview, suicidal ideations are not present. Thoughts of self harm are not present. Homicidal ideations are not present.   There are no auditory hallucinations, visual hallucinations, paranoid ideations, or delusional thought processes. There are no withdrawals reported today.    Diagnosis:  Final diagnoses:  Severe episode of recurrent major depressive disorder, without psychotic features (HCC)  Alcohol  abuse  Substance induced mood disorder (HCC)    Total Time spent with patient: 1  hour  Pain Medications: See MAR Prescriptions: See MAR Over the Counter: See MAR History of alcohol  / drug use?: Yes Longest period of sobriety (when/how long): UTA Negative Consequences of Use: Personal relationships, Financial Withdrawal Symptoms: Sweats, Nausea / Vomiting, Tremors Name of Substance 1: ETOH 1 - Age of First Use: UTA 1 - Amount (size/oz): 1/5 liquor and 8-10 beers 1 - Frequency: 3-4 times per week 1 - Duration: UTA 1 - Last Use / Amount: 09/24/23 1 - Method of Aquiring: UTA 1- Route of Use: UTA     Current Medications:  Current Facility-Administered Medications  Medication Dose Route Frequency Provider Last Rate Last Admin   acetaminophen  (TYLENOL ) tablet 650 mg  650 mg Oral Q6H PRN Ajibola, Ene A, NP   650 mg at 09/24/23 2235   alum & mag hydroxide-simeth (MAALOX/MYLANTA) 200-200-20 MG/5ML suspension 30 mL  30 mL Oral Q4H PRN Ajibola, Ene A, NP       hydrOXYzine  (ATARAX ) tablet 25 mg  25 mg Oral Q6H PRN Ajibola, Ene A, NP       loperamide  (IMODIUM ) capsule 2-4 mg  2-4 mg Oral PRN Ajibola, Ene A, NP       LORazepam  (ATIVAN ) tablet 1 mg  1 mg Oral Q6H PRN Ajibola, Ene A, NP   1 mg at 09/24/23 2235   magnesium  hydroxide (MILK OF MAGNESIA) suspension 30 mL  30 mL Oral Daily PRN Ajibola, Ene A, NP       multivitamin with minerals tablet 1 tablet  1 tablet Oral Daily Ajibola, Ene A, NP  1 tablet at 09/24/23 2232   ondansetron  (ZOFRAN -ODT) disintegrating tablet 4 mg  4 mg Oral Q6H PRN Ajibola, Ene A, NP       sertraline  (ZOLOFT ) tablet 25 mg  25 mg Oral Daily Ajibola, Ene A, NP       thiamine  (VITAMIN B1) tablet 100 mg  100 mg Oral Daily Ajibola, Ene A, NP       traZODone  (DESYREL ) tablet 50 mg  50 mg Oral QHS PRN Ajibola, Ene A, NP   50 mg at 09/24/23 2235   Current Outpatient Medications  Medication Sig Dispense Refill   acetaminophen  (TYLENOL ) 500 MG tablet Take 2 tablets (1,000 mg total) by mouth every 6 (six) hours as needed.     methocarbamol  (ROBAXIN ) 500 MG  tablet Take 1 tablet (500 mg total) by mouth every 8 (eight) hours as needed for muscle spasms. 50 tablet 0   oxyCODONE  (OXY IR/ROXICODONE ) 5 MG immediate release tablet Take 1-2 tablets (5-10 mg total) by mouth every 4 (four) hours as needed for moderate pain (pain score 4-6) or severe pain (pain score 7-10) (5 moderate, 10 severe). 30 tablet 0    Labs  Lab Results:  Admission on 09/24/2023  Component Date Value Ref Range Status   WBC 09/24/2023 4.7  4.0 - 10.5 K/uL Final   RBC 09/24/2023 4.94  4.22 - 5.81 MIL/uL Final   Hemoglobin 09/24/2023 15.8  13.0 - 17.0 g/dL Final   HCT 98/87/7974 44.4  39.0 - 52.0 % Final   MCV 09/24/2023 89.9  80.0 - 100.0 fL Final   MCH 09/24/2023 32.0  26.0 - 34.0 pg Final   MCHC 09/24/2023 35.6  30.0 - 36.0 g/dL Final   RDW 98/87/7974 13.3  11.5 - 15.5 % Final   Platelets 09/24/2023 313  150 - 400 K/uL Final   nRBC 09/24/2023 0.0  0.0 - 0.2 % Final   Neutrophils Relative % 09/24/2023 37  % Final   Neutro Abs 09/24/2023 1.7  1.7 - 7.7 K/uL Final   Lymphocytes Relative 09/24/2023 53  % Final   Lymphs Abs 09/24/2023 2.5  0.7 - 4.0 K/uL Final   Monocytes Relative 09/24/2023 9  % Final   Monocytes Absolute 09/24/2023 0.4  0.1 - 1.0 K/uL Final   Eosinophils Relative 09/24/2023 0  % Final   Eosinophils Absolute 09/24/2023 0.0  0.0 - 0.5 K/uL Final   Basophils Relative 09/24/2023 1  % Final   Basophils Absolute 09/24/2023 0.0  0.0 - 0.1 K/uL Final   Immature Granulocytes 09/24/2023 0  % Final   Abs Immature Granulocytes 09/24/2023 0.01  0.00 - 0.07 K/uL Final   Performed at [facility], [address], [place], [place] [address]   Sodium 09/24/2023 141  135 - 145 mmol/L Final   Potassium 09/24/2023 4.3  3.5 - 5.1 mmol/L Final   Chloride 09/24/2023 103  98 - 111 mmol/L Final   CO2 09/24/2023 28  22 - 32 mmol/L Final   Glucose, Bld 09/24/2023 87  70 - 99 mg/dL Final   Glucose reference range applies only to samples taken after fasting for at least 8  hours.   BUN 09/24/2023 8  6 - 20 mg/dL Final   Creatinine, Ser 09/24/2023 0.94  0.61 - 1.24 mg/dL Final   Calcium  09/24/2023 10.0  8.9 - 10.3 mg/dL Final   Total Protein 98/87/7974 7.8  6.5 - 8.1 g/dL Final   Albumin 98/87/7974 4.5  3.5 - 5.0 g/dL Final  AST 09/24/2023 51 (H)  15 - 41 U/L Final   ALT 09/24/2023 39  0 - 44 U/L Final   Alkaline Phosphatase 09/24/2023 124  38 - 126 U/L Final   Total Bilirubin 09/24/2023 1.4 (H)  0.0 - 1.2 mg/dL Final   GFR, Estimated 09/24/2023 >60  >60 mL/min Final   Comment: (NOTE) Calculated using the CKD-EPI Creatinine Equation (2021)    Anion gap 09/24/2023 10  5 - 15 Final   Performed at [facility], [address] [place], [place] [address]   Hgb A1c MFr Bld 09/24/2023 5.2  4.8 - 5.6 % Final   Comment: (NOTE) Pre diabetes:          5.7%-6.4%  Diabetes:              >6.4%  Glycemic control for   <7.0% adults with diabetes    Mean Plasma Glucose 09/24/2023 102.54  mg/dL Final   Performed at [facility], [address] [place], [place] [address]   Alcohol , Ethyl (B) 09/24/2023 179 (H)  <10 mg/dL Final   Comment: (NOTE) Lowest detectable limit for serum alcohol  is 10 mg/dL.  For medical purposes only. Performed at [facility], [address] [place], [place] [address]    Cholesterol 09/24/2023 127  0 - 200 mg/dL Final   Triglycerides 98/87/7974 303 (H)  <150 mg/dL Final   HDL 98/87/7974 63  >40 mg/dL Final   Total CHOL/HDL Ratio 09/24/2023 2.0  RATIO Final   VLDL 09/24/2023 61 (H)  0 - 40 mg/dL Final   LDL Cholesterol 09/24/2023 3  0 - 99 mg/dL Final   Comment:        Total Cholesterol/HDL:CHD Risk Coronary Heart Disease Risk Table                     Men   Women  1/2 Average Risk   3.4   3.3  Average Risk       5.0   4.4  2 X Average Risk   9.6   7.1  3 X Average Risk  23.4   11.0        Use the calculated Patient Ratio above and the CHD Risk Table to determine the patient's CHD Risk.        ATP III  CLASSIFICATION (LDL):  <100     mg/dL   Optimal  899-870  mg/dL   Near or Above                    Optimal  130-159  mg/dL   Borderline  839-810  mg/dL   High  >809     mg/dL   Very High Performed at [facility], [address], [place], [place] [address]    TSH 09/24/2023 1.228  0.350 - 4.500 uIU/mL Final   Comment: Performed by a 3rd Generation assay with a functional sensitivity of <=0.01 uIU/mL. Performed at [facility], [address] [place], [place] [address]    POC Amphetamine UR 09/24/2023 None Detected  NONE DETECTED (Cut Off Level 1000 ng/mL) Final   POC Secobarbital (BAR) 09/24/2023 None Detected  NONE DETECTED (Cut Off Level 300 ng/mL) Final   POC Buprenorphine (BUP) 09/24/2023 None Detected  NONE DETECTED (Cut Off Level 10 ng/mL) Final   POC Oxazepam (BZO) 09/24/2023 None Detected  NONE DETECTED (Cut Off Level 300 ng/mL) Final   POC Cocaine UR 09/24/2023 None  Detected  NONE DETECTED (Cut Off Level 300 ng/mL) Final   POC Methamphetamine UR 09/24/2023 None Detected  NONE DETECTED (Cut Off Level 1000 ng/mL) Final   POC Morphine  09/24/2023 None Detected  NONE DETECTED (Cut Off Level 300 ng/mL) Final   POC Methadone UR 09/24/2023 None Detected  NONE DETECTED (Cut Off Level 300 ng/mL) Final   POC Oxycodone  UR 09/24/2023 None Detected  NONE DETECTED (Cut Off Level 100 ng/mL) Final   POC Marijuana UR 09/24/2023 None Detected  NONE DETECTED (Cut Off Level 50 ng/mL) Final  Admission on 08/03/2023, Discharged on 08/04/2023  Component Date Value Ref Range Status   Sodium 08/03/2023 140  135 - 145 mmol/L Final   Potassium 08/03/2023 4.0  3.5 - 5.1 mmol/L Final   Chloride 08/03/2023 101  98 - 111 mmol/L Final   CO2 08/03/2023 21 (L)  22 - 32 mmol/L Final   Glucose, Bld 08/03/2023 122 (H)  70 - 99 mg/dL Final   Glucose reference range applies only to samples taken after fasting for at least 8 hours.   BUN 08/03/2023 7  6 - 20 mg/dL Final   Creatinine, Ser 08/03/2023 1.35  (H)  0.61 - 1.24 mg/dL Final   Calcium  08/03/2023 9.4  8.9 - 10.3 mg/dL Final   Total Protein 88/78/7975 7.8  6.5 - 8.1 g/dL Final   Albumin 88/78/7975 4.5  3.5 - 5.0 g/dL Final   AST 88/78/7975 230 (H)  15 - 41 U/L Final   ALT 08/03/2023 92 (H)  0 - 44 U/L Final   Alkaline Phosphatase 08/03/2023 83  38 - 126 U/L Final   Total Bilirubin 08/03/2023 1.4 (H)  <1.2 mg/dL Final   GFR, Estimated 08/03/2023 >60  >60 mL/min Final   Comment: (NOTE) Calculated using the CKD-EPI Creatinine Equation (2021)    Anion gap 08/03/2023 18 (H)  5 - 15 Final   Performed at [facility], [address], [place], [place] [address]   Sodium 08/03/2023 140  135 - 145 mmol/L Final   Potassium 08/03/2023 4.0  3.5 - 5.1 mmol/L Final   Chloride 08/03/2023 103  98 - 111 mmol/L Final   BUN 08/03/2023 6  6 - 20 mg/dL Final   Creatinine, Ser 08/03/2023 1.50 (H)  0.61 - 1.24 mg/dL Final   Glucose, Bld 88/78/7975 120 (H)  70 - 99 mg/dL Final   Glucose reference range applies only to samples taken after fasting for at least 8 hours.   Calcium , Ion 08/03/2023 1.03 (L)  1.15 - 1.40 mmol/L Final   TCO2 08/03/2023 21 (L)  22 - 32 mmol/L Final   Hemoglobin 08/03/2023 16.7  13.0 - 17.0 g/dL Final   HCT 88/78/7975 49.0  39.0 - 52.0 % Final   WBC 08/03/2023 7.8  4.0 - 10.5 K/uL Final   RBC 08/03/2023 4.84  4.22 - 5.81 MIL/uL Final   Hemoglobin 08/03/2023 15.9  13.0 - 17.0 g/dL Final   HCT 88/78/7975 47.3  39.0 - 52.0 % Final   MCV 08/03/2023 97.7  80.0 - 100.0 fL Final   MCH 08/03/2023 32.9  26.0 - 34.0 pg Final   MCHC 08/03/2023 33.6  30.0 - 36.0 g/dL Final   RDW 88/78/7975 13.3  11.5 - 15.5 % Final   Platelets 08/03/2023 275  150 - 400 K/uL Final   nRBC 08/03/2023 0.0  0.0 - 0.2 % Final   Performed at [facility], [address] [place], [place] [address]   Alcohol ,  Ethyl (B) 08/03/2023 152 (H)  <10 mg/dL Final   Comment: (NOTE) Lowest detectable limit for serum alcohol  is 10 mg/dL.  For medical  purposes only. Performed at [facility], [address], [place], [place] [address]    Color, Urine 08/03/2023 YELLOW  YELLOW Final   APPearance 08/03/2023 CLEAR  CLEAR Final   Specific Gravity, Urine 08/03/2023 >1.046 (H)  1.005 - 1.030 Final   pH 08/03/2023 5.0  5.0 - 8.0 Final   Glucose, UA 08/03/2023 NEGATIVE  NEGATIVE mg/dL Final   Hgb urine dipstick 08/03/2023 MODERATE (A)  NEGATIVE Final   Bilirubin Urine 08/03/2023 NEGATIVE  NEGATIVE Final   Ketones, ur 08/03/2023 5 (A)  NEGATIVE mg/dL Final   Protein, ur 88/78/7975 100 (A)  NEGATIVE mg/dL Final   Nitrite 88/78/7975 NEGATIVE  NEGATIVE Final   Leukocytes,Ua 08/03/2023 NEGATIVE  NEGATIVE Final   RBC / HPF 08/03/2023 0-5  0 - 5 RBC/hpf Final   WBC, UA 08/03/2023 0-5  0 - 5 WBC/hpf Final   Bacteria, UA 08/03/2023 RARE (A)  NONE SEEN Final   Squamous Epithelial / HPF 08/03/2023 0-5  0 - 5 /HPF Final   Mucus 08/03/2023 PRESENT   Final   Hyaline Casts, UA 08/03/2023 PRESENT   Final   Performed at [facility], [address], [place], [place] [address]   Lactic Acid, Venous 08/03/2023 7.4 (HH)  0.5 - 1.9 mmol/L Final   Comment 08/03/2023 NOTIFIED PHYSICIAN   Final   Prothrombin Time 08/03/2023 14.0  11.4 - 15.2 seconds Final   INR 08/03/2023 1.1  0.8 - 1.2 Final   Comment: (NOTE) INR goal varies based on device and disease states. Performed at [facility], [address] [place], [place] [address]    Blood Bank Specimen 08/03/2023 SAMPLE AVAILABLE FOR TESTING   Final   Sample Expiration 08/03/2023    Final                   Value:08/06/2023,2359 Performed at [facility], [address], [place], [place] [address]    Opiates 08/03/2023 NONE DETECTED  NONE DETECTED Final   Cocaine 08/03/2023 NONE DETECTED  NONE DETECTED Final   Benzodiazepines 08/03/2023 NONE DETECTED  NONE DETECTED Final   Amphetamines 08/03/2023 NONE DETECTED  NONE DETECTED Final   Tetrahydrocannabinol 08/03/2023 POSITIVE (A)  NONE  DETECTED Final   Barbiturates 08/03/2023 NONE DETECTED  NONE DETECTED Final   Comment: (NOTE) DRUG SCREEN FOR MEDICAL PURPOSES ONLY.  IF CONFIRMATION IS NEEDED FOR ANY PURPOSE, NOTIFY LAB WITHIN 5 DAYS.  LOWEST DETECTABLE LIMITS FOR URINE DRUG SCREEN Drug Class                     Cutoff (ng/mL) Amphetamine and metabolites    1000 Barbiturate and metabolites    200 Benzodiazepine                 200 Opiates and metabolites        300 Cocaine and metabolites        300 THC                            50 Performed at [facility], [address], [place], [place] [address]    WBC 08/04/2023 9.2  4.0 - 10.5 K/uL Final   RBC 08/04/2023 3.91 (L)  4.22 - 5.81 MIL/uL Final   Hemoglobin 08/04/2023  12.9 (L)  13.0 - 17.0 g/dL Final   HCT 88/77/7975 37.0 (L)  39.0 - 52.0 % Final   MCV 08/04/2023 94.6  80.0 - 100.0 fL Final   MCH 08/04/2023 33.0  26.0 - 34.0 pg Final   MCHC 08/04/2023 34.9  30.0 - 36.0 g/dL Final   RDW 88/77/7975 13.1  11.5 - 15.5 % Final   Platelets 08/04/2023 211  150 - 400 K/uL Final   nRBC 08/04/2023 0.0  0.0 - 0.2 % Final   Performed at [facility], [address], [place], [place] [address]   Sodium 08/04/2023 133 (L)  135 - 145 mmol/L Final   Comment: DELTA CHECK NOTED REPEATED TO VERIFY    Potassium 08/04/2023 3.8  3.5 - 5.1 mmol/L Final   Chloride 08/04/2023 98  98 - 111 mmol/L Final   CO2 08/04/2023 24  22 - 32 mmol/L Final   Glucose, Bld 08/04/2023 134 (H)  70 - 99 mg/dL Final   Glucose reference range applies only to samples taken after fasting for at least 8 hours.   BUN 08/04/2023 7  6 - 20 mg/dL Final   Creatinine, Ser 08/04/2023 0.95  0.61 - 1.24 mg/dL Final   Calcium  08/04/2023 8.7 (L)  8.9 - 10.3 mg/dL Final   GFR, Estimated 08/04/2023 >60  >60 mL/min Final   Comment: (NOTE) Calculated using the CKD-EPI Creatinine Equation (2021)    Anion gap 08/04/2023 11  5 - 15 Final   Performed at [facility], [address],  [place], [place] [address]   Magnesium  08/04/2023 1.5 (L)  1.7 - 2.4 mg/dL Final   Performed at [facility], [address], [place], [place] [address]   Phosphorus 08/04/2023 3.8  2.5 - 4.6 mg/dL Final   Performed at [facility], [address] [place], [place] [address]   HIV Screen 4th Generation wRfx 08/04/2023 Non Reactive  Non Reactive Final   Performed at [facility], [address], [place], [place] [address]   Total Protein 08/04/2023 6.3 (L)  6.5 - 8.1 g/dL Final   Albumin 88/77/7975 3.6  3.5 - 5.0 g/dL Final   AST 88/77/7975 134 (H)  15 - 41 U/L Final   ALT 08/04/2023 73 (H)  0 - 44 U/L Final   Alkaline Phosphatase 08/04/2023 62  38 - 126 U/L Final   Total Bilirubin 08/04/2023 1.6 (H)  <1.2 mg/dL Final   Bilirubin, Direct 08/04/2023 0.3 (H)  0.0 - 0.2 mg/dL Final   Indirect Bilirubin 08/04/2023 1.3 (H)  0.3 - 0.9 mg/dL Final   Performed at [facility], [address] [place], [place] [address]  Admission on 05/10/2023, Discharged on 05/10/2023  Component Date Value Ref Range Status   Lipase 05/10/2023 33  11 - 51 U/L Final   Performed at [facility], [address] [place], [place] [address]   Sodium 05/10/2023 140  135 - 145 mmol/L Final   Potassium 05/10/2023 3.0 (L)  3.5 - 5.1 mmol/L Final   Chloride 05/10/2023 104  98 - 111 mmol/L Final   CO2 05/10/2023 23  22 - 32 mmol/L Final   Glucose, Bld 05/10/2023 153 (H)  70 - 99 mg/dL Final   Glucose reference range applies only to samples taken after fasting for at least 8 hours.   BUN 05/10/2023 <5 (L)  6 - 20 mg/dL Final   Creatinine, Ser 05/10/2023 1.06  0.61 -  1.24 mg/dL Final   Calcium  05/10/2023 8.8 (L)  8.9 - 10.3 mg/dL Final   Total Protein 91/71/7975 7.4  6.5 - 8.1 g/dL Final   Albumin 91/71/7975 4.2  3.5 - 5.0 g/dL Final   AST 91/71/7975 32  15 - 41 U/L Final   ALT 05/10/2023 31  0 - 44 U/L Final   Alkaline Phosphatase 05/10/2023 85  38 - 126 U/L Final   Total Bilirubin 05/10/2023 1.3  (H)  0.3 - 1.2 mg/dL Final   GFR, Estimated 05/10/2023 >60  >60 mL/min Final   Comment: (NOTE) Calculated using the CKD-EPI Creatinine Equation (2021)    Anion gap 05/10/2023 13  5 - 15 Final   Performed at [facility], [address], [place], [place] [address]   WBC 05/10/2023 6.1  4.0 - 10.5 K/uL Final   RBC 05/10/2023 4.56  4.22 - 5.81 MIL/uL Final   Hemoglobin 05/10/2023 14.9  13.0 - 17.0 g/dL Final   HCT 91/71/7975 41.6  39.0 - 52.0 % Final   MCV 05/10/2023 91.2  80.0 - 100.0 fL Final   MCH 05/10/2023 32.7  26.0 - 34.0 pg Final   MCHC 05/10/2023 35.8  30.0 - 36.0 g/dL Final   RDW 91/71/7975 15.6 (H)  11.5 - 15.5 % Final   Platelets 05/10/2023 254  150 - 400 K/uL Final   nRBC 05/10/2023 0.0  0.0 - 0.2 % Final   Performed at [facility], [address], [place], [place] [address]  Admission on 04/05/2023, Discharged on 04/07/2023  Component Date Value Ref Range Status   Lipase 04/05/2023 54 (H)  11 - 51 U/L Final   Performed at [facility], [address], [place], [place] [address]   Sodium 04/05/2023 133 (L)  135 - 145 mmol/L Final   Potassium 04/05/2023 5.1  3.5 - 5.1 mmol/L Final   HEMOLYSIS AT THIS LEVEL MAY AFFECT RESULT   Chloride 04/05/2023 91 (L)  98 - 111 mmol/L Final   CO2 04/05/2023 <7 (L)  22 - 32 mmol/L Final   Glucose, Bld 04/05/2023 222 (H)  70 - 99 mg/dL Final   Glucose reference range applies only to samples taken after fasting for at least 8 hours.   BUN 04/05/2023 13  6 - 20 mg/dL Final   Creatinine, Ser 04/05/2023 1.44 (H)  0.61 - 1.24 mg/dL Final   Calcium  04/05/2023 8.6 (L)  8.9 - 10.3 mg/dL Final   Total Protein 92/75/7975 7.8  6.5 - 8.1 g/dL Final   Albumin 92/75/7975 4.4  3.5 - 5.0 g/dL Final   AST 92/75/7975 133 (H)  15 - 41 U/L Final   HEMOLYSIS AT THIS LEVEL MAY AFFECT RESULT   ALT 04/05/2023 86 (H)  0 - 44 U/L Final   HEMOLYSIS AT THIS LEVEL MAY AFFECT RESULT   Alkaline Phosphatase 04/05/2023 86  38 - 126 U/L Final   Total  Bilirubin 04/05/2023 1.0  0.3 - 1.2 mg/dL Final   HEMOLYSIS AT THIS LEVEL MAY AFFECT RESULT   GFR, Estimated 04/05/2023 >60  >60 mL/min Final   Comment: (NOTE) Calculated using the CKD-EPI Creatinine Equation (2021)    Anion gap 04/05/2023 NOT CALCULATED  5 - 15 Final   Performed at [facility], [address], [place], [place] [address]   WBC 04/05/2023 11.2 (H)  4.0 - 10.5 K/uL Final   RBC 04/05/2023 4.71  4.22 - 5.81 MIL/uL Final   Hemoglobin 04/05/2023 15.1  13.0 - 17.0 g/dL Final   HCT 92/75/7975 44.8  39.0 - 52.0 % Final   MCV 04/05/2023 95.1  80.0 - 100.0 fL Final   MCH 04/05/2023 32.1  26.0 - 34.0 pg Final   MCHC 04/05/2023 33.7  30.0 - 36.0 g/dL Final   RDW 92/75/7975 14.3  11.5 - 15.5 % Final   Platelets 04/05/2023 340  150 - 400 K/uL Final   nRBC 04/05/2023 0.0  0.0 - 0.2 % Final   Performed at [facility], [address] [place], [place] [address]   Alcohol , Ethyl (B) 04/05/2023 47 (H)  <10 mg/dL Final   Comment: (NOTE) Lowest detectable limit for serum alcohol  is 10 mg/dL.  For medical purposes only. Performed at [facility], [address], [place], [place] [address]    Salicylate Lvl 04/05/2023 <7.0 (L)  7.0 - 30.0 mg/dL Final   Performed at [facility], [address], [place], [place] [address]   Acetaminophen  (Tylenol ), Serum 04/05/2023 <10 (L)  10 - 30 ug/mL Final   Comment: (NOTE) Therapeutic concentrations vary significantly. A range of 10-30 ug/mL  may be an effective concentration for many patients. However, some  are best treated at concentrations outside of this range. Acetaminophen  concentrations >150 ug/mL at 4 hours after ingestion  and >50 ug/mL at 12 hours after ingestion are often associated with  toxic reactions.  Performed at [facility] Lab, [address] [place], [place] [address]    ABO/RH(D) 04/05/2023 O POS   Final   Antibody Screen 04/05/2023 NEG   Final   Sample Expiration 04/05/2023    Final                    Value:04/08/2023,2359 Performed at [facility], [address], [place], [place] [address]    ABO/RH(D) 04/05/2023    Final                   Value:O POS Performed at [facility], [address] [place], [place] [address]    WBC 04/06/2023 5.9  4.0 - 10.5 K/uL Final   RBC 04/06/2023 3.80 (L)  4.22 - 5.81 MIL/uL Final   Hemoglobin 04/06/2023 12.6 (L)  13.0 - 17.0 g/dL Final   HCT 92/74/7975 34.8 (L)  39.0 - 52.0 % Final   MCV 04/06/2023 91.6  80.0 - 100.0 fL Final   MCH 04/06/2023 33.2  26.0 - 34.0 pg Final   MCHC 04/06/2023 36.2 (H)  30.0 - 36.0 g/dL Final   RDW 92/74/7975 14.1  11.5 - 15.5 % Final   Platelets 04/06/2023 235  150 - 400 K/uL Final   nRBC 04/06/2023 0.0  0.0 - 0.2 % Final   Neutrophils Relative % 04/06/2023 71  % Final   Neutro Abs 04/06/2023 4.2  1.7 - 7.7 K/uL Final   Lymphocytes Relative 04/06/2023 18  % Final   Lymphs Abs 04/06/2023 1.1  0.7 - 4.0 K/uL Final   Monocytes Relative 04/06/2023 11  % Final   Monocytes Absolute 04/06/2023 0.6  0.1 - 1.0 K/uL Final   Eosinophils Relative 04/06/2023 0  % Final   Eosinophils Absolute 04/06/2023 0.0  0.0 - 0.5 K/uL Final   Basophils Relative 04/06/2023 0  % Final   Basophils Absolute 04/06/2023 0.0  0.0 - 0.1 K/uL Final   Immature Granulocytes 04/06/2023 0  % Final   Abs Immature Granulocytes 04/06/2023 0.02  0.00 - 0.07  K/uL Final   Performed at [facility], [address] [place], [place] [address]   Sodium 04/06/2023 134 (L)  135 - 145 mmol/L Final   Potassium 04/06/2023 3.7  3.5 - 5.1 mmol/L Final   Chloride 04/06/2023 98  98 - 111 mmol/L Final   CO2 04/06/2023 24  22 - 32 mmol/L Final   Glucose, Bld 04/06/2023 119 (H)  70 - 99 mg/dL Final   Glucose reference range applies only to samples taken after fasting for at least 8 hours.   BUN 04/06/2023 8  6 - 20 mg/dL Final   Creatinine, Ser 04/06/2023 1.13  0.61 - 1.24 mg/dL Final   Calcium  04/06/2023 8.9  8.9 - 10.3 mg/dL Final   Total  Protein 04/06/2023 6.2 (L)  6.5 - 8.1 g/dL Final   Albumin 92/74/7975 3.6  3.5 - 5.0 g/dL Final   AST 92/74/7975 82 (H)  15 - 41 U/L Final   ALT 04/06/2023 66 (H)  0 - 44 U/L Final   Alkaline Phosphatase 04/06/2023 61  38 - 126 U/L Final   Total Bilirubin 04/06/2023 2.9 (H)  0.3 - 1.2 mg/dL Final   GFR, Estimated 04/06/2023 >60  >60 mL/min Final   Comment: (NOTE) Calculated using the CKD-EPI Creatinine Equation (2021)    Anion gap 04/06/2023 12  5 - 15 Final   Performed at [facility], [address] [place], [place] [address]   Magnesium  04/06/2023 1.8  1.7 - 2.4 mg/dL Final   Performed at [facility], [address], [place], [place] [address]   Phosphorus 04/06/2023 2.7  2.5 - 4.6 mg/dL Final   Performed at [facility], [address], [place], [place] [address]   Hemoglobin 04/06/2023 12.3 (L)  13.0 - 17.0 g/dL Final   HCT 92/74/7975 34.6 (L)  39.0 - 52.0 % Final   Performed at [facility], [address] [place], [place] [address]   aPTT 04/06/2023 23 (L)  24 - 36 seconds Final   Performed at [facility], [address] [place], [place] [address]   Prothrombin Time 04/06/2023 13.7  11.4 - 15.2 seconds Final   INR 04/06/2023 1.0  0.8 - 1.2 Final   Comment: (NOTE) INR goal varies based on device and disease states. Performed at [facility], [address] [place], [place] [address]    WBC 04/07/2023 6.5  4.0 - 10.5 K/uL Final   RBC 04/07/2023 3.85 (L)  4.22 - 5.81 MIL/uL Final   Hemoglobin 04/07/2023 13.0  13.0 - 17.0 g/dL Final   HCT 92/73/7975 35.2 (L)  39.0 - 52.0 % Final   MCV 04/07/2023 91.4  80.0 - 100.0 fL Final   MCH 04/07/2023 33.8  26.0 - 34.0 pg Final   MCHC 04/07/2023 36.9 (H)  30.0 - 36.0 g/dL Final   RDW 92/73/7975 13.9  11.5 - 15.5 % Final   Platelets 04/07/2023 204  150 - 400 K/uL Final   nRBC 04/07/2023 0.0  0.0 - 0.2 % Final   Neutrophils Relative % 04/07/2023 64  % Final   Neutro Abs 04/07/2023 4.1  1.7 - 7.7  K/uL Final   Lymphocytes Relative 04/07/2023 30  % Final   Lymphs Abs 04/07/2023 2.0  0.7 - 4.0 K/uL Final   Monocytes Relative 04/07/2023 6  % Final   Monocytes Absolute 04/07/2023 0.4  0.1 - 1.0 K/uL Final   Eosinophils Relative 04/07/2023 0  %  Final   Eosinophils Absolute 04/07/2023 0.0  0.0 - 0.5 K/uL Final   Basophils Relative 04/07/2023 0  % Final   Basophils Absolute 04/07/2023 0.0  0.0 - 0.1 K/uL Final   Immature Granulocytes 04/07/2023 0  % Final   Abs Immature Granulocytes 04/07/2023 0.01  0.00 - 0.07 K/uL Final   Performed at [facility], [address] [place], [place] [address]   SURGICAL PATHOLOGY 04/07/2023    Final-Edited                   Value:SURGICAL PATHOLOGY CASE: [id number] PATIENT: PENNE ABATE Surgical Pathology Report     Clinical History: Hematemesis, esophagitis; r/o H pylori and erosive esophagitis (nt)     FINAL MICROSCOPIC DIAGNOSIS:  A. STOMACH, BIOPSY: Fundic type gastric mucosa showing reactive gastropathy with minimal chronic gastritis Negative for H. pylori, intestinal metaplasia, dysplasia and carcinoma  B. ESOPHAGEAL BIOPSY: Acute esophagitis Negative for glandular epithelium, eosinophils, dysplasia and carcinoma Fungal stain negative (PASF, adequate control)   GROSS DESCRIPTION:  A. Received in formalin labeled with the patients name and Gastric bx is a 0.9 x 0.8 x 0.2 cm aggregate of tan soft tissue fragments, submitted in toto in a single cassette.  B. Received in formalin labeled with the patients name and Esophageal bx are three 0.1-0.4 cm pieces of tan soft tissue, submitted in toto in a single cassette.  (LEF 04/07/2023)   Final Diagnosis performed                          by Prentice Pitcher, MD.   Electronically signed 04/11/2023 Technical and / or Professional components performed at [facility]. [facility], [address] [place], [place] [address].  Immunohistochemistry Technical  component (if applicable) was performed at [facility]. [address], [place], [place] [address].   IMMUNOHISTOCHEMISTRY DISCLAIMER (if applicable): Some of these immunohistochemical stains may have been developed and the performance characteristics determine by [facility]. Some may not have been cleared or approved by the U.S. Food and Drug Administration. The FDA has determined that such clearance or approval is not necessary. This test is used for clinical purposes. It should not be regarded as investigational or for research. This laboratory is certified under the Clinical Laboratory Improvement Amendments of 1988 (CLIA-88) as qualified to perform high complexity clinical laboratory testing.  The controls                          stained appropriately.   IHC stains are performed on formalin fixed, paraffin embedded tissue using a 3,3diaminobenzidine (DAB) chromogen and Leica Bond Autostainer System. The staining intensity of the nucleus is score manually and is reported as the percentage of tumor cell nuclei demonstrating specific nuclear staining. The specimens are fixed in 10% Neutral Formalin for at least 6 hours and up to 72hrs. These tests are validated on decalcified tissue. Results should be interpreted with caution given the possibility of false negative results on decalcified specimens. Antibody Clones are as follows ER-clone 63F, PR-clone 16, Ki67- clone MM1. Some of these immunohistochemical stains may have been developed and the performance characteristics determined by [facility].     Blood Alcohol  level:  Lab Results  Component Value Date   ETH 179 (H) 09/24/2023   ETH 152 (H) 08/03/2023    Metabolic Disorder Labs: Lab Results  Component Value Date   HGBA1C  5.2 09/24/2023   MPG 102.54 09/24/2023   MPG 93.93 01/17/2023   No results found for: PROLACTIN Lab Results  Component Value Date   CHOL 127  09/24/2023   TRIG 303 (H) 09/24/2023   HDL 63 09/24/2023   CHOLHDL 2.0 09/24/2023   VLDL 61 (H) 09/24/2023   LDLCALC 3 09/24/2023   LDLCALC 68 01/17/2023    Therapeutic Lab Levels: No results found for: LITHIUM No results found for: VALPROATE No results found for: CBMZ  Physical Findings   CAGE-AID    Flowsheet Row ED to Hosp-Admission (Discharged) from 08/03/2023 in [place] [facility]  CAGE-AID Score 4      GAD-7    Flowsheet Row Video Visit from 07/06/2021 in [facility] at [facility] Office Visit from 10/21/2020 in [facility] Primary Care at [facility]  Total GAD-7 Score 16 0      PHQ2-9    Flowsheet Row ED from 01/16/2023 in [facility] Counselor from 07/26/2022 in [facility] Video Visit from 07/06/2021 in [facility] Primary Care at [facility] Office Visit from 01/12/2021 in [facility] Primary Care at [facility] Office Visit from 11/12/2020 in [facility]  PHQ-2 Total Score 4 5 2 2  0  PHQ-9 Total Score 6 20 14 9 4       Flowsheet Row ED from 09/24/2023 in [facility] ED to Hosp-Admission (Discharged) from 08/03/2023 in [place] [facility] ED from 05/10/2023 in [facility] Department at [facility]  C-SSRS RISK CATEGORY No Risk No Risk No Risk        Musculoskeletal  Strength & Muscle Tone: within normal limits Gait & Station: normal Patient leans: N/A  Psychiatric Specialty Exam  Presentation  General Appearance:  Appropriate for Environment  Eye Contact: Good  Speech: Clear and Coherent  Speech Volume: Normal  Handedness: Right   Mood and Affect  Mood: Depressed  Affect: Congruent   Thought Process  Thought Processes: Coherent  Descriptions of Associations:Intact  Orientation:Full (Time, Place and Person)  Thought Content:WDL  Diagnosis  of Schizophrenia or Schizoaffective disorder in past: No    Hallucinations:Hallucinations: None  Ideas of Reference:None  Suicidal Thoughts:Suicidal Thoughts: No  Homicidal Thoughts:Homicidal Thoughts: No   Sensorium  Memory: Immediate Good; Recent Good; Remote Good  Judgment: Fair  Insight: Good   Executive Functions  Concentration: Good  Attention Span: Good  Recall: Good  Fund of Knowledge: Good  Language: Good   Psychomotor Activity  Psychomotor Activity: Psychomotor Activity: Normal   Assets  Assets: Communication Skills; Desire for Improvement; Housing; Physical Health; Social Support   Sleep  Sleep: Sleep: Fair Number of Hours of Sleep: 4   Nutritional Assessment (For OBS and FBC admissions only) Has the patient had a weight loss or gain of 10 pounds or more in the last 3 months?: No Has the patient had a decrease in food intake/or appetite?: No Does the patient have dental problems?: No Does the patient have eating habits or behaviors that may be indicators of an eating disorder including binging or inducing vomiting?: No Has the patient recently lost weight without trying?: 0 Has the patient been eating poorly because of a decreased appetite?: 0 Malnutrition Screening Tool Score: 0    Physical Exam  Physical Exam Vitals and nursing note reviewed.  Constitutional:      General: He is not in acute distress.  Appearance: He is not ill-appearing.  HENT:     Head: Normocephalic and atraumatic.  Eyes:     Conjunctiva/sclera: Conjunctivae normal.  Pulmonary:     Effort: Pulmonary effort is normal. No respiratory distress.  Musculoskeletal:        General: Normal range of motion.  Skin:    General: Skin is warm and dry.    Review of Systems  All other systems reviewed and are negative.  Blood pressure 118/89, pulse 96, temperature 98.4 F (36.9 C), temperature source Oral, resp. rate 18, SpO2 99%. There is no height or weight  on file to calculate BMI.  Treatment Plan Summary: Daily contact with patient to assess and evaluate symptoms and progress in treatment and Medication management    Labs:  - UDS is negative - Ethanol level 179 on 09/24/2023 - TSH WNL - Lipid panel showing elevated TGs 303, VLDL 61 - EKG showing QTc 460, Wolff-Parkinson-White  - Continue sertraline  25 mg for depression - CMP showing elevation in AST 51, normal ALT 39, elevated total bilirubin 1.4 - A1c 5.2%  CIWA Protocol CIWA score is 7 at 7AM on 09/24/2022 - started librium  taper -lorazepam  1 mg every 6 hours prn for CIWA >10 -thiamine  100 mg daily for nutritional supplementation -hydroxyzine  25 mg every 6 hours prn for anxiety, CIWA < or = 10 -ondansetron  4 mg ODT every 6 hours prn nausea/vomiting -loperamide  2-4 mg capsule prn diarrhea or loose stools    Signed: Marlo Masson, MD 09/25/2023 8:29 AM

## 2023-09-25 NOTE — Progress Notes (Signed)
 Pt resting quietly in room. Awakens to verbal prompts. Denies Pain at this time. CIWA 7 at this time.

## 2023-09-25 NOTE — Group Note (Signed)
 Group Topic: Social Support  Group Date: 09/25/2023 Start Time: 1930 End Time: 2000 Facilitators: Joan Plowman B  Department: [facility]  Number of Participants: 2  Group Focus: check in Treatment Modality:  Leisure Development Interventions utilized were support Purpose: express feelings  Name: Xavier Ramsey Date of Birth: 07/13/1991  MR: [id number]    Level of Participation: active Quality of Participation: attentive and cooperative Interactions with others: gave feedback Mood/Affect: appropriate and brightens with interaction Triggers (if applicable): NA Cognition: coherent/clear Progress: Gaining insight Response: NA Plan: patient will be encouraged to keep going to groups.   Patients Problems:  Patient Active Problem List   Diagnosis Date Noted   Trauma 08/03/2023   Epigastric pain 04/07/2023   Acute esophagitis 04/07/2023   Gastritis and gastroduodenitis 04/07/2023   Acute upper GI bleed 04/06/2023   Nausea & vomiting 04/06/2023   AKI (acute kidney injury) (HCC) 04/06/2023   Transaminitis 04/06/2023   Leukocytosis 04/06/2023   Chronic alcohol  abuse 04/06/2023   Tobacco abuse 04/06/2023   GIB (gastrointestinal bleeding) 04/06/2023   Alcohol  use disorder, severe, dependence (HCC) 01/17/2023   MDD (major depressive disorder), recurrent episode (HCC) 07/26/2022   Anxiety 07/26/2022   Postconcussive syndrome 07/26/2022   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

## 2023-09-25 NOTE — ED Notes (Deleted)
 Pt resting quietly in room.  Awakens to verbal prompts.  Denies Pain at this time. CIWA 8 at this time.

## 2023-09-25 NOTE — ED Provider Notes (Addendum)
 Facility Based Crisis Admission H&P  Date: 09/25/23 Patient Name: Xavier Ramsey MRN: [id number] Chief Complaint:  I need help with my depression and alcohol  use  Diagnoses:  Final diagnoses:  Severe episode of recurrent major depressive disorder, without psychotic features (HCC)  Alcohol  abuse  Substance induced mood disorder (HCC)    HPI: Xavier Ramsey is a 33 year old male with reported psychiatric history of alcohol  abuse, PTSD, and depression. He presented voluntarily to [facility] reporting depressive symptoms and requesting alcohol  detox.  Patient is accompanied by his mother, Fronie Hollingsworth [phone]).  Patient gave verbal consent for his mother to remain present during his assessment.  Patient was evaluated face-to-face and his chart was reviewed by this nurse practitioner.  On assessment, patient reports long history of alcohol  abuse.  He reports that he started drinking alcohol  when he was [age] years old.  He reports alcohol  became a problem when he suffered multiple concussions and was unable to continue playing college football.  Patient reported he drinks approximately 1/5 of liquor and sometimes 4 to 5 cans of 12 ounce beer daily.  Patient report he was sober for a few weeks after participating in substance abuse program through [facility] in May 2024.  He reported he relapsed shortly after discharging from the program due to his mental health and environment. He reports he is depressed and uses alcohol  to cope with depressive symptoms.  He is endorsing depressive symptoms of sadness, hopelessness, worthlessness, irritability, isolation, crying spells, anhedonia, poor sleep, and low mood. Patient reports that he experienced passive suicidal ideation approximately 1 to 2 weeks ago.  He reports history of suicidal ideation with plan to hang himself when he was 33 y/o.  He says he has never attempted suicide.  He denies active suicidal ideation.  He denies homicidal ideation,  hallucination, and paranoia.  He admits to history of cocaine abuse in his late 13s but denies current/recent use. He reports smoking 1 marijuana blunt 1-2 times per week.   Patient denies history of alcohol  withdrawal seizure or DTs.  He reports alcohol  withdrawal symptoms of loss of appetite, nausea, sweats, shakiness, anxiety, and headaches  UDS on admission is 179mg /dl  During assessment, patient is alert and oriented x 4.  He is calm and cooperative.  Patient is speaking in a clear tone, moderate rate and volume.  Patient has good eye contact.  Patient's mood is depressed and his affect is congruent with his mood.  Patient's thought process is coherent and relevant.  His insight is intact, judgment is fair.  There is no objective evidence that patient is responding to any stimuli or experiencing any delusional thought content.  PHQ 2-9:  Flowsheet Row ED from 01/16/2023 in [facility] Counselor from 07/26/2022 in [facility] Video Visit from 07/06/2021 in [facility]  Thoughts that you would be better off dead, or of hurting yourself in some way Not at all Not at all Not at all  PHQ-9 Total Score 6 20 14        Flowsheet Row ED from 09/24/2023 in [facility] ED to Hosp-Admission (Discharged) from 08/03/2023 in [facility] ED from 05/10/2023 in [facility] Emergency Department at [facility]  C-SSRS RISK CATEGORY No Risk No Risk No Risk       Screenings    Flowsheet Row Most Recent Value  CIWA-Ar Total 11  Total Time spent with patient: 30 minutes  Musculoskeletal  Strength & Muscle Tone: within normal limits Gait & Station: normal Patient leans: Right  Psychiatric Specialty Exam  Presentation General Appearance:  Appropriate for Environment  Eye Contact: Good  Speech: Clear and Coherent  Speech  Volume: Normal  Handedness: Right   Mood and Affect  Mood: Depressed  Affect: Congruent   Thought Process  Thought Processes: Coherent  Descriptions of Associations:Intact  Orientation:Full (Time, Place and Person)  Thought Content:WDL  Diagnosis of Schizophrenia or Schizoaffective disorder in past: No   Hallucinations:Hallucinations: None  Ideas of Reference:None  Suicidal Thoughts:Suicidal Thoughts: No  Homicidal Thoughts:Homicidal Thoughts: No   Sensorium  Memory: Immediate Good; Recent Good; Remote Good  Judgment: Fair  Insight: Good   Executive Functions  Concentration: Good  Attention Span: Good  Recall: Good  Fund of Knowledge: Good  Language: Good   Psychomotor Activity  Psychomotor Activity: Psychomotor Activity: Normal   Assets  Assets: Communication Skills; Desire for Improvement; Housing; Physical Health; Social Support   Sleep  Sleep: Sleep: Fair Number of Hours of Sleep: 4   Nutritional Assessment (For OBS and FBC admissions only) Has the patient had a weight loss or gain of 10 pounds or more in the last 3 months?: No Has the patient had a decrease in food intake/or appetite?: No Does the patient have dental problems?: No Does the patient have eating habits or behaviors that may be indicators of an eating disorder including binging or inducing vomiting?: No Has the patient recently lost weight without trying?: 0 Has the patient been eating poorly because of a decreased appetite?: 0 Malnutrition Screening Tool Score: 0    Physical Exam Vitals and nursing note reviewed.  Constitutional:      General: He is not in acute distress.    Appearance: Normal appearance. He is well-developed. He is not ill-appearing.  HENT:     Head: Normocephalic and atraumatic.  Eyes:     Conjunctiva/sclera: Conjunctivae normal.  Cardiovascular:     Rate and Rhythm: Regular rhythm.  Pulmonary:     Effort: Pulmonary effort is  normal.  Abdominal:     Palpations: Abdomen is soft.     Tenderness: There is no abdominal tenderness.  Musculoskeletal:        General: Normal range of motion.     Cervical back: Normal range of motion.  Skin:    Capillary Refill: Capillary refill takes less than 2 seconds.  Neurological:     Mental Status: He is alert and oriented to person, place, and time.  Psychiatric:        Attention and Perception: Attention and perception normal.        Mood and Affect: Mood normal.        Speech: Speech normal.        Behavior: Behavior normal. Behavior is cooperative.        Thought Content: Thought content normal.        Cognition and Memory: Cognition normal.    Review of Systems  Constitutional: Negative.   HENT: Negative.    Eyes: Negative.   Respiratory: Negative.    Cardiovascular: Negative.   Gastrointestinal: Negative.   Genitourinary: Negative.   Musculoskeletal: Negative.   Skin: Negative.   Neurological: Negative.   Endo/Heme/Allergies: Negative.   Psychiatric/Behavioral:  Positive for depression and substance abuse. The patient is nervous/anxious.     Blood pressure 118/89, pulse 96, temperature 98.4 F (36.9 C), temperature source Oral, resp. rate  18, SpO2 99%. There is no height or weight on file to calculate BMI.  Past Psychiatric History:  Past Medical History:  Diagnosis Date   Anxiety    Phreesia 10/21/2020   Depression    Substance abuse (HCC)    Phreesia 10/21/2020      Is the patient at risk to self? No  Has the patient been a risk to self in the past 6 months? No .    Has the patient been a risk to self within the distant past? No   Is the patient a risk to others? No   Has the patient been a risk to others in the past 6 months? No   Has the patient been a risk to others within the distant past? No   Past Medical History:  Past Medical History:  Diagnosis Date   Anxiety    Phreesia 10/21/2020   Depression    Substance abuse (HCC)     Phreesia 10/21/2020    Family History:  Family History  Problem Relation Age of Onset   Hypertension Mother     Social History:  Social History   Tobacco Use   Smoking status: Every Day    Current packs/day: 0.50    Types: Cigarettes   Smokeless tobacco: Never  Vaping Use   Vaping status: Some Days   Substances: Nicotine   Substance Use Topics   Alcohol  use: Yes    Alcohol /week: 2.0 standard drinks of alcohol     Types: 2 Shots of liquor per week    Comment: weekends   Drug use: Not Currently    Types: Marijuana     Last Labs:  Admission on 09/24/2023  Component Date Value Ref Range Status   WBC 09/24/2023 4.7  4.0 - 10.5 K/uL Final   RBC 09/24/2023 4.94  4.22 - 5.81 MIL/uL Final   Hemoglobin 09/24/2023 15.8  13.0 - 17.0 g/dL Final   HCT 98/87/7974 44.4  39.0 - 52.0 % Final   MCV 09/24/2023 89.9  80.0 - 100.0 fL Final   MCH 09/24/2023 32.0  26.0 - 34.0 pg Final   MCHC 09/24/2023 35.6  30.0 - 36.0 g/dL Final   RDW 98/87/7974 13.3  11.5 - 15.5 % Final   Platelets 09/24/2023 313  150 - 400 K/uL Final   nRBC 09/24/2023 0.0  0.0 - 0.2 % Final   Neutrophils Relative % 09/24/2023 37  % Final   Neutro Abs 09/24/2023 1.7  1.7 - 7.7 K/uL Final   Lymphocytes Relative 09/24/2023 53  % Final   Lymphs Abs 09/24/2023 2.5  0.7 - 4.0 K/uL Final   Monocytes Relative 09/24/2023 9  % Final   Monocytes Absolute 09/24/2023 0.4  0.1 - 1.0 K/uL Final   Eosinophils Relative 09/24/2023 0  % Final   Eosinophils Absolute 09/24/2023 0.0  0.0 - 0.5 K/uL Final   Basophils Relative 09/24/2023 1  % Final   Basophils Absolute 09/24/2023 0.0  0.0 - 0.1 K/uL Final   Immature Granulocytes 09/24/2023 0  % Final   Abs Immature Granulocytes 09/24/2023 0.01  0.00 - 0.07 K/uL Final   Performed at [facility], [address], [place], [place] [address]   Sodium 09/24/2023 141  135 - 145 mmol/L Final   Potassium 09/24/2023 4.3  3.5 - 5.1 mmol/L Final   Chloride 09/24/2023 103  98 - 111 mmol/L  Final   CO2 09/24/2023 28  22 - 32 mmol/L Final   Glucose, Bld 09/24/2023 87  70 - 99 mg/dL Final   Glucose reference range applies only to samples taken after fasting for at least 8 hours.   BUN 09/24/2023 8  6 - 20 mg/dL Final   Creatinine, Ser 09/24/2023 0.94  0.61 - 1.24 mg/dL Final   Calcium  09/24/2023 10.0  8.9 - 10.3 mg/dL Final   Total Protein 98/87/7974 7.8  6.5 - 8.1 g/dL Final   Albumin 98/87/7974 4.5  3.5 - 5.0 g/dL Final   AST 98/87/7974 51 (H)  15 - 41 U/L Final   ALT 09/24/2023 39  0 - 44 U/L Final   Alkaline Phosphatase 09/24/2023 124  38 - 126 U/L Final   Total Bilirubin 09/24/2023 1.4 (H)  0.0 - 1.2 mg/dL Final   GFR, Estimated 09/24/2023 >60  >60 mL/min Final   Comment: (NOTE) Calculated using the CKD-EPI Creatinine Equation (2021)    Anion gap 09/24/2023 10  5 - 15 Final   Performed at [facility], [address], [place], [place] [address]   Hgb A1c MFr Bld 09/24/2023 5.2  4.8 - 5.6 % Final   Comment: (NOTE) Pre diabetes:          5.7%-6.4%  Diabetes:              >6.4%  Glycemic control for   <7.0% adults with diabetes    Mean Plasma Glucose 09/24/2023 102.54  mg/dL Final   Performed at [facility], [address] [place], [place] [address]   Alcohol , Ethyl (B) 09/24/2023 179 (H)  <10 mg/dL Final   Comment: (NOTE) Lowest detectable limit for serum alcohol  is 10 mg/dL.  For medical purposes only. Performed at [facility], [address], [place], [place] [address]    Cholesterol 09/24/2023 127  0 - 200 mg/dL Final   Triglycerides 98/87/7974 303 (H)  <150 mg/dL Final   HDL 98/87/7974 63  >40 mg/dL Final   Total CHOL/HDL Ratio 09/24/2023 2.0  RATIO Final   VLDL 09/24/2023 61 (H)  0 - 40 mg/dL Final   LDL Cholesterol 09/24/2023 3  0 - 99 mg/dL Final   Comment:        Total Cholesterol/HDL:CHD Risk Coronary Heart Disease Risk Table                     Men   Women  1/2 Average Risk   3.4   3.3  Average Risk       5.0   4.4  2 X  Average Risk   9.6   7.1  3 X Average Risk  23.4   11.0        Use the calculated Patient Ratio above and the CHD Risk Table to determine the patient's CHD Risk.        ATP III CLASSIFICATION (LDL):  <100     mg/dL   Optimal  899-870  mg/dL   Near or Above                    Optimal  130-159  mg/dL   Borderline  839-810  mg/dL   High  >809     mg/dL   Very High Performed at [facility], [address], [place], [place] [address]    TSH 09/24/2023 1.228  0.350 - 4.500 uIU/mL Final   Comment: Performed by a 3rd Generation assay with a functional sensitivity of <=0.01 uIU/mL. Performed at [facility], [address] [place], [place] [address]    POC Amphetamine UR 09/24/2023 None Detected  NONE DETECTED (Cut Off Level 1000 ng/mL) Final   POC Secobarbital (BAR) 09/24/2023 None Detected  NONE DETECTED (Cut Off Level 300 ng/mL) Final   POC Buprenorphine (BUP) 09/24/2023 None Detected  NONE DETECTED (Cut Off Level 10 ng/mL) Final   POC Oxazepam (BZO) 09/24/2023 None Detected  NONE DETECTED (Cut Off Level 300 ng/mL) Final   POC Cocaine UR 09/24/2023 None Detected  NONE DETECTED (Cut Off Level 300 ng/mL) Final   POC Methamphetamine UR 09/24/2023 None Detected  NONE DETECTED (Cut Off Level 1000 ng/mL) Final   POC Morphine  09/24/2023 None Detected  NONE DETECTED (Cut Off Level 300 ng/mL) Final   POC Methadone UR 09/24/2023 None Detected  NONE DETECTED (Cut Off Level 300 ng/mL) Final   POC Oxycodone  UR 09/24/2023 None Detected  NONE DETECTED (Cut Off Level 100 ng/mL) Final   POC Marijuana UR 09/24/2023 None Detected  NONE DETECTED (Cut Off Level 50 ng/mL) Final  Admission on 08/03/2023, Discharged on 08/04/2023  Component Date Value Ref Range Status   Sodium 08/03/2023 140  135 - 145 mmol/L Final   Potassium 08/03/2023 4.0  3.5 - 5.1 mmol/L Final   Chloride 08/03/2023 101  98 - 111 mmol/L Final   CO2 08/03/2023 21 (L)  22 - 32 mmol/L Final   Glucose, Bld 08/03/2023 122 (H)  70  - 99 mg/dL Final   Glucose reference range applies only to samples taken after fasting for at least 8 hours.   BUN 08/03/2023 7  6 - 20 mg/dL Final   Creatinine, Ser 08/03/2023 1.35 (H)  0.61 - 1.24 mg/dL Final   Calcium  08/03/2023 9.4  8.9 - 10.3 mg/dL Final   Total Protein 88/78/7975 7.8  6.5 - 8.1 g/dL Final   Albumin 88/78/7975 4.5  3.5 - 5.0 g/dL Final   AST 88/78/7975 230 (H)  15 - 41 U/L Final   ALT 08/03/2023 92 (H)  0 - 44 U/L Final   Alkaline Phosphatase 08/03/2023 83  38 - 126 U/L Final   Total Bilirubin 08/03/2023 1.4 (H)  <1.2 mg/dL Final   GFR, Estimated 08/03/2023 >60  >60 mL/min Final   Comment: (NOTE) Calculated using the CKD-EPI Creatinine Equation (2021)    Anion gap 08/03/2023 18 (H)  5 - 15 Final   Performed at [facility], [address], [place], [place] [address]   Sodium 08/03/2023 140  135 - 145 mmol/L Final   Potassium 08/03/2023 4.0  3.5 - 5.1 mmol/L Final   Chloride 08/03/2023 103  98 - 111 mmol/L Final   BUN 08/03/2023 6  6 - 20 mg/dL Final   Creatinine, Ser 08/03/2023 1.50 (H)  0.61 - 1.24 mg/dL Final   Glucose, Bld 88/78/7975 120 (H)  70 - 99 mg/dL Final   Glucose reference range applies only to samples taken after fasting for at least 8 hours.   Calcium , Ion 08/03/2023 1.03 (L)  1.15 - 1.40 mmol/L Final   TCO2 08/03/2023 21 (L)  22 - 32 mmol/L Final   Hemoglobin 08/03/2023 16.7  13.0 - 17.0 g/dL Final   HCT 88/78/7975 49.0  39.0 - 52.0 % Final   WBC 08/03/2023 7.8  4.0 - 10.5 K/uL Final   RBC 08/03/2023 4.84  4.22 - 5.81 MIL/uL Final   Hemoglobin 08/03/2023 15.9  13.0 - 17.0 g/dL Final   HCT 88/78/7975 47.3  39.0 - 52.0 % Final   MCV  08/03/2023 97.7  80.0 - 100.0 fL Final   MCH 08/03/2023 32.9  26.0 - 34.0 pg Final   MCHC 08/03/2023 33.6  30.0 - 36.0 g/dL Final   RDW 88/78/7975 13.3  11.5 - 15.5 % Final   Platelets 08/03/2023 275  150 - 400 K/uL Final   nRBC 08/03/2023 0.0  0.0 - 0.2 % Final   Performed at [facility], [address], [place], [place] [address]   Alcohol , Ethyl (B) 08/03/2023 152 (H)  <10 mg/dL Final   Comment: (NOTE) Lowest detectable limit for serum alcohol  is 10 mg/dL.  For medical purposes only. Performed at [facility], [address], [place], [place] [address]    Color, Urine 08/03/2023 YELLOW  YELLOW Final   APPearance 08/03/2023 CLEAR  CLEAR Final   Specific Gravity, Urine 08/03/2023 >1.046 (H)  1.005 - 1.030 Final   pH 08/03/2023 5.0  5.0 - 8.0 Final   Glucose, UA 08/03/2023 NEGATIVE  NEGATIVE mg/dL Final   Hgb urine dipstick 08/03/2023 MODERATE (A)  NEGATIVE Final   Bilirubin Urine 08/03/2023 NEGATIVE  NEGATIVE Final   Ketones, ur 08/03/2023 5 (A)  NEGATIVE mg/dL Final   Protein, ur 88/78/7975 100 (A)  NEGATIVE mg/dL Final   Nitrite 88/78/7975 NEGATIVE  NEGATIVE Final   Leukocytes,Ua 08/03/2023 NEGATIVE  NEGATIVE Final   RBC / HPF 08/03/2023 0-5  0 - 5 RBC/hpf Final   WBC, UA 08/03/2023 0-5  0 - 5 WBC/hpf Final   Bacteria, UA 08/03/2023 RARE (A)  NONE SEEN Final   Squamous Epithelial / HPF 08/03/2023 0-5  0 - 5 /HPF Final   Mucus 08/03/2023 PRESENT   Final   Hyaline Casts, UA 08/03/2023 PRESENT   Final   Performed at [facility], [address] [place], [place] [address]   Lactic Acid, Venous 08/03/2023 7.4 (HH)  0.5 - 1.9 mmol/L Final   Comment 08/03/2023 NOTIFIED PHYSICIAN   Final   Prothrombin Time 08/03/2023 14.0  11.4 - 15.2 seconds Final   INR 08/03/2023 1.1  0.8 - 1.2 Final   Comment: (NOTE) INR goal varies based on device and disease states. Performed at [facility], [address], [place], [place] [address]    Blood Bank Specimen 08/03/2023 SAMPLE AVAILABLE FOR TESTING   Final   Sample Expiration 08/03/2023    Final                   Value:08/06/2023,2359 Performed at [facility], [address], [place], [place] [address]    Opiates 08/03/2023 NONE DETECTED  NONE DETECTED Final   Cocaine 08/03/2023 NONE DETECTED  NONE DETECTED Final    Benzodiazepines 08/03/2023 NONE DETECTED  NONE DETECTED Final   Amphetamines 08/03/2023 NONE DETECTED  NONE DETECTED Final   Tetrahydrocannabinol 08/03/2023 POSITIVE (A)  NONE DETECTED Final   Barbiturates 08/03/2023 NONE DETECTED  NONE DETECTED Final   Comment: (NOTE) DRUG SCREEN FOR MEDICAL PURPOSES ONLY.  IF CONFIRMATION IS NEEDED FOR ANY PURPOSE, NOTIFY LAB WITHIN 5 DAYS.  LOWEST DETECTABLE LIMITS FOR URINE DRUG SCREEN Drug Class                     Cutoff (ng/mL) Amphetamine and metabolites    1000 Barbiturate and metabolites    200 Benzodiazepine                 200 Opiates and metabolites        300  Cocaine and metabolites        300 THC                            50 Performed at [facility], [address], [place], [place] [address]    WBC 08/04/2023 9.2  4.0 - 10.5 K/uL Final   RBC 08/04/2023 3.91 (L)  4.22 - 5.81 MIL/uL Final   Hemoglobin 08/04/2023 12.9 (L)  13.0 - 17.0 g/dL Final   HCT 88/77/7975 37.0 (L)  39.0 - 52.0 % Final   MCV 08/04/2023 94.6  80.0 - 100.0 fL Final   MCH 08/04/2023 33.0  26.0 - 34.0 pg Final   MCHC 08/04/2023 34.9  30.0 - 36.0 g/dL Final   RDW 88/77/7975 13.1  11.5 - 15.5 % Final   Platelets 08/04/2023 211  150 - 400 K/uL Final   nRBC 08/04/2023 0.0  0.0 - 0.2 % Final   Performed at [facility], [address] [place], [place] [address]   Sodium 08/04/2023 133 (L)  135 - 145 mmol/L Final   Comment: DELTA CHECK NOTED REPEATED TO VERIFY    Potassium 08/04/2023 3.8  3.5 - 5.1 mmol/L Final   Chloride 08/04/2023 98  98 - 111 mmol/L Final   CO2 08/04/2023 24  22 - 32 mmol/L Final   Glucose, Bld 08/04/2023 134 (H)  70 - 99 mg/dL Final   Glucose reference range applies only to samples taken after fasting for at least 8 hours.   BUN 08/04/2023 7  6 - 20 mg/dL Final   Creatinine, Ser 08/04/2023 0.95  0.61 - 1.24 mg/dL Final   Calcium  08/04/2023 8.7 (L)  8.9 - 10.3 mg/dL Final   GFR, Estimated 08/04/2023 >60  >60 mL/min Final    Comment: (NOTE) Calculated using the CKD-EPI Creatinine Equation (2021)    Anion gap 08/04/2023 11  5 - 15 Final   Performed at [facility], [address], [place], [place] [address]   Magnesium  08/04/2023 1.5 (L)  1.7 - 2.4 mg/dL Final   Performed at [facility], [address] [place], [place] [address]   Phosphorus 08/04/2023 3.8  2.5 - 4.6 mg/dL Final   Performed at [facility], [address] [place], [place] [address]   HIV Screen 4th Generation wRfx 08/04/2023 Non Reactive  Non Reactive Final   Performed at [facility], [address] [place], [place] [address]   Total Protein 08/04/2023 6.3 (L)  6.5 - 8.1 g/dL Final   Albumin 88/77/7975 3.6  3.5 - 5.0 g/dL Final   AST 88/77/7975 134 (H)  15 - 41 U/L Final   ALT 08/04/2023 73 (H)  0 - 44 U/L Final   Alkaline Phosphatase 08/04/2023 62  38 - 126 U/L Final   Total Bilirubin 08/04/2023 1.6 (H)  <1.2 mg/dL Final   Bilirubin, Direct 08/04/2023 0.3 (H)  0.0 - 0.2 mg/dL Final   Indirect Bilirubin 08/04/2023 1.3 (H)  0.3 - 0.9 mg/dL Final   Performed at [facility], [address] [place], [place] [address]  Admission on 05/10/2023, Discharged on 05/10/2023  Component Date Value Ref Range Status   Lipase 05/10/2023 33  11 - 51 U/L Final   Performed at [facility], [address] [place], [place] [address]   Sodium 05/10/2023 140  135 - 145 mmol/L Final   Potassium 05/10/2023  3.0 (L)  3.5 - 5.1 mmol/L Final   Chloride 05/10/2023 104  98 - 111 mmol/L Final   CO2 05/10/2023 23  22 - 32 mmol/L Final   Glucose, Bld 05/10/2023 153 (H)  70 - 99 mg/dL Final   Glucose reference range applies only to samples taken after fasting for at least 8 hours.   BUN 05/10/2023 <5 (L)  6 - 20 mg/dL Final   Creatinine, Ser 05/10/2023 1.06  0.61 - 1.24 mg/dL Final   Calcium  05/10/2023 8.8 (L)  8.9 - 10.3 mg/dL Final   Total Protein 91/71/7975 7.4  6.5 - 8.1 g/dL Final   Albumin 91/71/7975 4.2  3.5 - 5.0 g/dL  Final   AST 91/71/7975 32  15 - 41 U/L Final   ALT 05/10/2023 31  0 - 44 U/L Final   Alkaline Phosphatase 05/10/2023 85  38 - 126 U/L Final   Total Bilirubin 05/10/2023 1.3 (H)  0.3 - 1.2 mg/dL Final   GFR, Estimated 05/10/2023 >60  >60 mL/min Final   Comment: (NOTE) Calculated using the CKD-EPI Creatinine Equation (2021)    Anion gap 05/10/2023 13  5 - 15 Final   Performed at [facility] Lab, [address], [place], [place] [address]   WBC 05/10/2023 6.1  4.0 - 10.5 K/uL Final   RBC 05/10/2023 4.56  4.22 - 5.81 MIL/uL Final   Hemoglobin 05/10/2023 14.9  13.0 - 17.0 g/dL Final   HCT 91/71/7975 41.6  39.0 - 52.0 % Final   MCV 05/10/2023 91.2  80.0 - 100.0 fL Final   MCH 05/10/2023 32.7  26.0 - 34.0 pg Final   MCHC 05/10/2023 35.8  30.0 - 36.0 g/dL Final   RDW 91/71/7975 15.6 (H)  11.5 - 15.5 % Final   Platelets 05/10/2023 254  150 - 400 K/uL Final   nRBC 05/10/2023 0.0  0.0 - 0.2 % Final   Performed at [facility] Lab, [address], [place], [place] [address]  Admission on 04/05/2023, Discharged on 04/07/2023  Component Date Value Ref Range Status   Lipase 04/05/2023 54 (H)  11 - 51 U/L Final   Performed at [facility], [address] [place], [place] [address]   Sodium 04/05/2023 133 (L)  135 - 145 mmol/L Final   Potassium 04/05/2023 5.1  3.5 - 5.1 mmol/L Final   HEMOLYSIS AT THIS LEVEL MAY AFFECT RESULT   Chloride 04/05/2023 91 (L)  98 - 111 mmol/L Final   CO2 04/05/2023 <7 (L)  22 - 32 mmol/L Final   Glucose, Bld 04/05/2023 222 (H)  70 - 99 mg/dL Final   Glucose reference range applies only to samples taken after fasting for at least 8 hours.   BUN 04/05/2023 13  6 - 20 mg/dL Final   Creatinine, Ser 04/05/2023 1.44 (H)  0.61 - 1.24 mg/dL Final   Calcium  04/05/2023 8.6 (L)  8.9 - 10.3 mg/dL Final   Total Protein 92/75/7975 7.8  6.5 - 8.1 g/dL Final   Albumin 92/75/7975 4.4  3.5 - 5.0 g/dL Final   AST 92/75/7975 133 (H)  15 - 41 U/L Final   HEMOLYSIS AT THIS  LEVEL MAY AFFECT RESULT   ALT 04/05/2023 86 (H)  0 - 44 U/L Final   HEMOLYSIS AT THIS LEVEL MAY AFFECT RESULT   Alkaline Phosphatase 04/05/2023 86  38 - 126 U/L Final   Total Bilirubin 04/05/2023 1.0  0.3 - 1.2 mg/dL Final   HEMOLYSIS AT THIS LEVEL  MAY AFFECT RESULT   GFR, Estimated 04/05/2023 >60  >60 mL/min Final   Comment: (NOTE) Calculated using the CKD-EPI Creatinine Equation (2021)    Anion gap 04/05/2023 NOT CALCULATED  5 - 15 Final   Performed at [facility], [address] [place], [place] [address]   WBC 04/05/2023 11.2 (H)  4.0 - 10.5 K/uL Final   RBC 04/05/2023 4.71  4.22 - 5.81 MIL/uL Final   Hemoglobin 04/05/2023 15.1  13.0 - 17.0 g/dL Final   HCT 92/75/7975 44.8  39.0 - 52.0 % Final   MCV 04/05/2023 95.1  80.0 - 100.0 fL Final   MCH 04/05/2023 32.1  26.0 - 34.0 pg Final   MCHC 04/05/2023 33.7  30.0 - 36.0 g/dL Final   RDW 92/75/7975 14.3  11.5 - 15.5 % Final   Platelets 04/05/2023 340  150 - 400 K/uL Final   nRBC 04/05/2023 0.0  0.0 - 0.2 % Final   Performed at [facility], [address], [place], [place] [address]   Alcohol , Ethyl (B) 04/05/2023 47 (H)  <10 mg/dL Final   Comment: (NOTE) Lowest detectable limit for serum alcohol  is 10 mg/dL.  For medical purposes only. Performed at [facility], [address], [place], [place] [address]    Salicylate Lvl 04/05/2023 <7.0 (L)  7.0 - 30.0 mg/dL Final   Performed at [facility], [address] [place], [place] [address]   Acetaminophen  (Tylenol ), Serum 04/05/2023 <10 (L)  10 - 30 ug/mL Final   Comment: (NOTE) Therapeutic concentrations vary significantly. A range of 10-30 ug/mL  may be an effective concentration for many patients. However, some  are best treated at concentrations outside of this range. Acetaminophen  concentrations >150 ug/mL at 4 hours after ingestion  and >50 ug/mL at 12 hours after ingestion are often associated with  toxic reactions.  Performed at [facility], [address] [place], [place] [address]    ABO/RH(D) 04/05/2023 O POS   Final   Antibody Screen 04/05/2023 NEG   Final   Sample Expiration 04/05/2023    Final                   Value:04/08/2023,2359 Performed at [facility], [address] [place], [place] [address]    ABO/RH(D) 04/05/2023    Final                   Value:O POS Performed at [facility], [address], [place], [place] [address]    WBC 04/06/2023 5.9  4.0 - 10.5 K/uL Final   RBC 04/06/2023 3.80 (L)  4.22 - 5.81 MIL/uL Final   Hemoglobin 04/06/2023 12.6 (L)  13.0 - 17.0 g/dL Final   HCT 92/74/7975 34.8 (L)  39.0 - 52.0 % Final   MCV 04/06/2023 91.6  80.0 - 100.0 fL Final   MCH 04/06/2023 33.2  26.0 - 34.0 pg Final   MCHC 04/06/2023 36.2 (H)  30.0 - 36.0 g/dL Final   RDW 92/74/7975 14.1  11.5 - 15.5 % Final   Platelets 04/06/2023 235  150 - 400 K/uL Final   nRBC 04/06/2023 0.0  0.0 - 0.2 % Final   Neutrophils Relative % 04/06/2023 71  % Final   Neutro Abs 04/06/2023 4.2  1.7 - 7.7 K/uL Final   Lymphocytes Relative 04/06/2023 18  % Final   Lymphs Abs 04/06/2023 1.1  0.7 - 4.0 K/uL Final  Monocytes Relative 04/06/2023 11  % Final   Monocytes Absolute 04/06/2023 0.6  0.1 - 1.0 K/uL Final   Eosinophils Relative 04/06/2023 0  % Final   Eosinophils Absolute 04/06/2023 0.0  0.0 - 0.5 K/uL Final   Basophils Relative 04/06/2023 0  % Final   Basophils Absolute 04/06/2023 0.0  0.0 - 0.1 K/uL Final   Immature Granulocytes 04/06/2023 0  % Final   Abs Immature Granulocytes 04/06/2023 0.02  0.00 - 0.07 K/uL Final   Performed at [facility], [address], [place], [place] [address]   Sodium 04/06/2023 134 (L)  135 - 145 mmol/L Final   Potassium 04/06/2023 3.7  3.5 - 5.1 mmol/L Final   Chloride 04/06/2023 98  98 - 111 mmol/L Final   CO2 04/06/2023 24  22 - 32 mmol/L Final   Glucose, Bld 04/06/2023 119 (H)  70 - 99 mg/dL Final   Glucose reference range applies only to samples taken after  fasting for at least 8 hours.   BUN 04/06/2023 8  6 - 20 mg/dL Final   Creatinine, Ser 04/06/2023 1.13  0.61 - 1.24 mg/dL Final   Calcium  04/06/2023 8.9  8.9 - 10.3 mg/dL Final   Total Protein 92/74/7975 6.2 (L)  6.5 - 8.1 g/dL Final   Albumin 92/74/7975 3.6  3.5 - 5.0 g/dL Final   AST 92/74/7975 82 (H)  15 - 41 U/L Final   ALT 04/06/2023 66 (H)  0 - 44 U/L Final   Alkaline Phosphatase 04/06/2023 61  38 - 126 U/L Final   Total Bilirubin 04/06/2023 2.9 (H)  0.3 - 1.2 mg/dL Final   GFR, Estimated 04/06/2023 >60  >60 mL/min Final   Comment: (NOTE) Calculated using the CKD-EPI Creatinine Equation (2021)    Anion gap 04/06/2023 12  5 - 15 Final   Performed at [facility], [address], [place], [place] [address]   Magnesium  04/06/2023 1.8  1.7 - 2.4 mg/dL Final   Performed at [facility], [address], [place], [place] [address]   Phosphorus 04/06/2023 2.7  2.5 - 4.6 mg/dL Final   Performed at [facility], [address], [place], [place] [address]   Hemoglobin 04/06/2023 12.3 (L)  13.0 - 17.0 g/dL Final   HCT 92/74/7975 34.6 (L)  39.0 - 52.0 % Final   Performed at [facility], [address], [place], [place] [address]   aPTT 04/06/2023 23 (L)  24 - 36 seconds Final   Performed at [facility], [address], [place], [place] [address]   Prothrombin Time 04/06/2023 13.7  11.4 - 15.2 seconds Final   INR 04/06/2023 1.0  0.8 - 1.2 Final   Comment: (NOTE) INR goal varies based on device and disease states. Performed at [facility], [address], [place], [place] [address]    WBC 04/07/2023 6.5  4.0 - 10.5 K/uL Final   RBC 04/07/2023 3.85 (L)  4.22 - 5.81 MIL/uL Final   Hemoglobin 04/07/2023 13.0  13.0 - 17.0 g/dL Final   HCT 92/73/7975 35.2 (L)  39.0 - 52.0 % Final   MCV 04/07/2023 91.4  80.0 - 100.0 fL Final   MCH 04/07/2023 33.8  26.0 - 34.0 pg Final   MCHC 04/07/2023 36.9 (H)  30.0 - 36.0 g/dL Final   RDW 92/73/7975 13.9  11.5 - 15.5 %  Final   Platelets 04/07/2023 204  150 - 400 K/uL Final  nRBC 04/07/2023 0.0  0.0 - 0.2 % Final   Neutrophils Relative % 04/07/2023 64  % Final   Neutro Abs 04/07/2023 4.1  1.7 - 7.7 K/uL Final   Lymphocytes Relative 04/07/2023 30  % Final   Lymphs Abs 04/07/2023 2.0  0.7 - 4.0 K/uL Final   Monocytes Relative 04/07/2023 6  % Final   Monocytes Absolute 04/07/2023 0.4  0.1 - 1.0 K/uL Final   Eosinophils Relative 04/07/2023 0  % Final   Eosinophils Absolute 04/07/2023 0.0  0.0 - 0.5 K/uL Final   Basophils Relative 04/07/2023 0  % Final   Basophils Absolute 04/07/2023 0.0  0.0 - 0.1 K/uL Final   Immature Granulocytes 04/07/2023 0  % Final   Abs Immature Granulocytes 04/07/2023 0.01  0.00 - 0.07 K/uL Final   Performed at [facility], [address] [place], [place] [address]   SURGICAL PATHOLOGY 04/07/2023    Final-Edited                   Value:SURGICAL PATHOLOGY CASE: [id number] PATIENT: PENNE ABATE Surgical Pathology Report     Clinical History: Hematemesis, esophagitis; r/o H pylori and erosive esophagitis (nt)     FINAL MICROSCOPIC DIAGNOSIS:  A. STOMACH, BIOPSY: Fundic type gastric mucosa showing reactive gastropathy with minimal chronic gastritis Negative for H. pylori, intestinal metaplasia, dysplasia and carcinoma  B. ESOPHAGEAL BIOPSY: Acute esophagitis Negative for glandular epithelium, eosinophils, dysplasia and carcinoma Fungal stain negative (PASF, adequate control)   GROSS DESCRIPTION:  A. Received in formalin labeled with the patients name and Gastric bx is a 0.9 x 0.8 x 0.2 cm aggregate of tan soft tissue fragments, submitted in toto in a single cassette.  B. Received in formalin labeled with the patients name and Esophageal bx are three 0.1-0.4 cm pieces of tan soft tissue, submitted in toto in a single cassette.  (LEF 04/07/2023)   Final Diagnosis performed                          by Prentice Pitcher, MD.    Electronically signed 04/11/2023 Technical and / or Professional components performed at [facility]. [facility], [address], [place], [place] [address].  Immunohistochemistry Technical component (if applicable) was performed at [facility]. [address], [place], [place] [address].   IMMUNOHISTOCHEMISTRY DISCLAIMER (if applicable): Some of these immunohistochemical stains may have been developed and the performance characteristics determine by [facility]. Some may not have been cleared or approved by the U.S. Food and Drug Administration. The FDA has determined that such clearance or approval is not necessary. This test is used for clinical purposes. It should not be regarded as investigational or for research. This laboratory is certified under the Clinical Laboratory Improvement Amendments of 1988 (CLIA-88) as qualified to perform high complexity clinical laboratory testing.  The controls                          stained appropriately.   IHC stains are performed on formalin fixed, paraffin embedded tissue using a 3,3diaminobenzidine (DAB) chromogen and Leica Bond Autostainer System. The staining intensity of the nucleus is score manually and is reported as the percentage of tumor cell nuclei demonstrating specific nuclear staining. The specimens are fixed in 10% Neutral Formalin for at least 6 hours and up to 72hrs. These tests are validated on decalcified tissue. Results should be interpreted with  caution given the possibility of false negative results on decalcified specimens. Antibody Clones are as follows ER-clone 23F, PR-clone 16, Ki67- clone MM1. Some of these immunohistochemical stains may have been developed and the performance characteristics determined by [facility].     Allergies: Patient has no known allergies.  Medications:  Facility Ordered Medications  Medication   acetaminophen  (TYLENOL ) tablet 650  mg   alum & mag hydroxide-simeth (MAALOX/MYLANTA) 200-200-20 MG/5ML suspension 30 mL   magnesium  hydroxide (MILK OF MAGNESIA) suspension 30 mL   traZODone  (DESYREL ) tablet 50 mg   [COMPLETED] thiamine  (VITAMIN B1) injection 100 mg   thiamine  (VITAMIN B1) tablet 100 mg   multivitamin with minerals tablet 1 tablet   LORazepam  (ATIVAN ) tablet 1 mg   hydrOXYzine  (ATARAX ) tablet 25 mg   loperamide  (IMODIUM ) capsule 2-4 mg   ondansetron  (ZOFRAN -ODT) disintegrating tablet 4 mg   sertraline  (ZOLOFT ) tablet 25 mg   PTA Medications  Medication Sig   acetaminophen  (TYLENOL ) 500 MG tablet Take 2 tablets (1,000 mg total) by mouth every 6 (six) hours as needed.   methocarbamol  (ROBAXIN ) 500 MG tablet Take 1 tablet (500 mg total) by mouth every 8 (eight) hours as needed for muscle spasms.   oxyCODONE  (OXY IR/ROXICODONE ) 5 MG immediate release tablet Take 1-2 tablets (5-10 mg total) by mouth every 4 (four) hours as needed for moderate pain (pain score 4-6) or severe pain (pain score 7-10) (5 moderate, 10 severe).    Long Term Goals: Improvement in symptoms so as ready for discharge  Short Term Goals: Patient will verbalize feelings in meetings with treatment team members., Patient will attend at least of 50% of the groups daily., Pt will complete the PHQ9 on admission, day 3 and discharge., Patient will participate in completing the [place] Suicide Severity Rating Scale, Patient will score a low risk of violence for 24 hours prior to discharge, and Patient will take medications as prescribed daily.  Medical Decision Making  Patient will be admitted to [facility] for substance abuse treatment and stabilization.  Labs: CBC, CMP, TSH, A1c, lipids, EtOH, UDS,  -EKG -Start sertraline  25 mg for depression  Initiate CIWA protocol -lorazepam  1 mg every 6 hours prn for CIWA >10 -thiamine  100 mg daily for nutritional supplementation -hydroxyzine  25 mg every 6 hours prn for anxiety, CIWA < or = 10 -ondansetron  4  mg ODT every 6 hours prn nausea/vomiting -loperamide  2-4 mg capsule prn diarrhea or loose stools     Recommendations  Based on my evaluation the patient does not appear to have an emergency medical condition.  Kathryne DELENA Show, NP 09/25/23  6:42 AM

## 2023-09-25 NOTE — Group Note (Signed)
 Group Topic: Relapse and Recovery  Group Date: 09/25/2023 Start Time: 1000 End Time: 1100 Facilitators: Cheyenne Alan SAILOR, NT  Department: [facility]  Number of Participants: 3  Group Focus: communication and substance abuse education Treatment Modality:  Individual Therapy Interventions utilized were group exercise and support Purpose: AA Group came into [facility] for a meeting. Patients attended and acted appropriately.     Name: Xavier Ramsey Date of Birth: 08-Oct-1990  MR: [id number]    Level of Participation: active Quality of Participation: attentive and cooperative Interactions with others:  Mood/Affect: appropriate Triggers (if applicable):  Cognition: coherent/clear Progress: Minimal Response:  Plan: patient will be encouraged to continue to go to meetings.   Patients Problems:  Patient Active Problem List   Diagnosis Date Noted   Trauma 08/03/2023   Epigastric pain 04/07/2023   Acute esophagitis 04/07/2023   Gastritis and gastroduodenitis 04/07/2023   Acute upper GI bleed 04/06/2023   Nausea & vomiting 04/06/2023   AKI (acute kidney injury) (HCC) 04/06/2023   Transaminitis 04/06/2023   Leukocytosis 04/06/2023   Chronic alcohol  abuse 04/06/2023   Tobacco abuse 04/06/2023   GIB (gastrointestinal bleeding) 04/06/2023   Alcohol  use disorder, severe, dependence (HCC) 01/17/2023   MDD (major depressive disorder), recurrent episode (HCC) 07/26/2022   Anxiety 07/26/2022   Postconcussive syndrome 07/26/2022   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

## 2023-09-26 ENCOUNTER — Encounter (HOSPITAL_COMMUNITY): Payer: Self-pay

## 2023-09-26 NOTE — ED Notes (Signed)
Patient is sleeping. Respirations equal and unlabored, skin warm and dry, NAD. No change in assessment or acuity. Routine safety checks conducted according to facility protocol. Will continue to monitor for safety.   

## 2023-09-26 NOTE — Group Note (Signed)
 Group Topic: Change and Accountability  Group Date: 09/26/2023 Start Time: 1215 End Time: 1245 Facilitators: Gerlean Leeroy HERO, RN  Department: [facility]  Number of Participants: 4  Group Focus: chemical dependency education Treatment Modality:  Interpersonal Therapy Interventions utilized were patient education Purpose: trigger / craving management  Name: Xavier Ramsey Date of Birth: 02-10-1991  MR: [id number]    Level of Participation: active Quality of Participation: attentive Interactions with others: gave feedback Mood/Affect: appropriate Triggers (if applicable): N/A Cognition: coherent/clear Progress: Gaining insight Response: engaged Plan: patient will be encouraged to utilize community and family as resources  Patients Problems:  Patient Active Problem List   Diagnosis Date Noted   Trauma 08/03/2023   Epigastric pain 04/07/2023   Acute esophagitis 04/07/2023   Gastritis and gastroduodenitis 04/07/2023   Acute upper GI bleed 04/06/2023   Nausea & vomiting 04/06/2023   AKI (acute kidney injury) (HCC) 04/06/2023   Transaminitis 04/06/2023   Leukocytosis 04/06/2023   Chronic alcohol  abuse 04/06/2023   Tobacco abuse 04/06/2023   GIB (gastrointestinal bleeding) 04/06/2023   Alcohol  use disorder, severe, dependence (HCC) 01/17/2023   MDD (major depressive disorder), recurrent episode (HCC) 07/26/2022   Anxiety 07/26/2022   Postconcussive syndrome 07/26/2022   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

## 2023-09-26 NOTE — Discharge Planning (Signed)
 LCSW spoke with patient regarding disposition plans and updates. Patient reports he reviewed the list of long-term facilities provided to him, and reports he would like to go to [facility] at this time for continued treatment. Patient aware that [facility] can admit him on Thursday by 9:00am and [facility] will provide transportation. Patient expressed understanding and appreciation for LCSW assistance. No other needs were reported by the patient at this time. LCSW to inform MD of plan. LCSW will continue to follow and provide support to patient while on [facility] unit.   Merlynn Lazier, LCSW Clinical Social Worker [place] Ph: [phone]

## 2023-09-26 NOTE — Group Note (Signed)
 Group Topic: Thought Challenging  Group Date: 09/26/2023 Start Time: 0300 End Time: 0400 Facilitators: Vanice Merlynn RAMAN, LCSW  Department: [facility]  Number of Participants: 2  Group Focus: activities of daily living skills, affirmation, clarity of thought, coping skills, daily focus, family, feeling awareness/expression, forgiveness, goals/reality orientation, personal responsibility, problem solving, relapse prevention, self-awareness, and self-esteem Treatment Modality:  Behavior Modification Therapy, Cognitive Behavioral Therapy, and Spiritual Interventions utilized were assignment, exploration, mental fitness, story telling, and support Purpose: enhance coping skills, explore maladaptive thinking, express feelings, express irrational fears, improve communication skills, increase insight, regain self-worth, reinforce self-care, relapse prevention strategies, and trigger / craving management  Name: Xavier Ramsey Date of Birth: 1991-05-26  MR: [id number]    Level of Participation: active Quality of Participation: attentive, cooperative, motivated, and offered feedback Interactions with others: gave feedback Mood/Affect: appropriate and positive Triggers (if applicable): Love  Cognition: coherent/clear and logical Progress: Gaining insight Response: Patient participated in group on today. Patient expressed understanding of group rules and confidentiality. Patient was able to mention his first thought regarding certain words and stigmas attached to mental health and substance use. Patient reports this activity challenged him to think differently about his current circumstance and to reconsider options for coping with said stigmas. Patient reports he really wants to challenge himself with doing something different than his default setting of drinking. Patient reports he has wrapped his identity into playing sports, and reports he is no longer able to do that so it  has caused him to be hopeless in life. Patient reports he has also loss many people in his life and reports it is easier for him not to face reality and just drink. Patient reports on the way to the facility, he finally opened up to his mother about some childhood trauma he experienced that she was unaware of. Patient reports it was hard to do however necessary. Patient reports he feels like a huge weight was lifted off his shoulders from doing so. LCSW provided supportive counseling and encouragement to the patient. Patient was receptive to the feedback provided. No issues to report.  Plan: referral / recommendations  Patients Problems:  Patient Active Problem List   Diagnosis Date Noted   Trauma 08/03/2023   Epigastric pain 04/07/2023   Acute esophagitis 04/07/2023   Gastritis and gastroduodenitis 04/07/2023   Acute upper GI bleed 04/06/2023   Nausea & vomiting 04/06/2023   AKI (acute kidney injury) (HCC) 04/06/2023   Transaminitis 04/06/2023   Leukocytosis 04/06/2023   Chronic alcohol  abuse 04/06/2023   Tobacco abuse 04/06/2023   GIB (gastrointestinal bleeding) 04/06/2023   Alcohol  use disorder, severe, dependence (HCC) 01/17/2023   MDD (major depressive disorder), recurrent episode (HCC) 07/26/2022   Anxiety 07/26/2022   Postconcussive syndrome 07/26/2022   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

## 2023-09-26 NOTE — ED Notes (Signed)
 Pt is in the dayroom watching TV with peers. Pt denies SI/HI/AVH. Pt has no further complain.No acute distress noted. Will continue to monitor for safety and provide support.

## 2023-09-26 NOTE — ED Notes (Signed)
 Patient is sleeping. Respirations equal and unlabored, skin warm and dry. No change in assessment or acuity. Routine safety checks conducted according to facility protocol. Will continue to monitor for safety.

## 2023-09-26 NOTE — Group Note (Signed)
 Group Topic: Communication  Group Date: 09/26/2023 Start Time: 1630 End Time: 1648 Facilitators: Brien Can B  Department: [facility]  Number of Participants: 3  Group Focus: communication and daily focus Treatment Modality:  Psychoeducation Interventions utilized were patient education and support Purpose: increase insight and regain self-worth  Name: Xavier Ramsey Date of Birth: Sep 27, 1990  MR: [id number]    Level of Participation: active Quality of Participation: attentive and cooperative Interactions with others: gave feedback Mood/Affect: positive Triggers (if applicable): n/a Cognition: coherent/clear Progress: Moderate Response: Pt is wanting to be free of his addiction Plan: follow-up needed  Patients Problems:  Patient Active Problem List   Diagnosis Date Noted   Trauma 08/03/2023   Epigastric pain 04/07/2023   Acute esophagitis 04/07/2023   Gastritis and gastroduodenitis 04/07/2023   Acute upper GI bleed 04/06/2023   Nausea & vomiting 04/06/2023   AKI (acute kidney injury) (HCC) 04/06/2023   Transaminitis 04/06/2023   Leukocytosis 04/06/2023   Chronic alcohol  abuse 04/06/2023   Tobacco abuse 04/06/2023   GIB (gastrointestinal bleeding) 04/06/2023   Alcohol  use disorder, severe, dependence (HCC) 01/17/2023   MDD (major depressive disorder), recurrent episode (HCC) 07/26/2022   Anxiety 07/26/2022   Postconcussive syndrome 07/26/2022   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

## 2023-09-26 NOTE — ED Notes (Signed)
 Pt sleeping at present, no distress noted.  Monitoring for safety.

## 2023-09-26 NOTE — ED Provider Notes (Signed)
 Behavioral Health Progress Note  Date and Time: 09/26/2023 8:28 AM Name: Xavier Ramsey MRN:  [id number]  Subjective:  Xavier Ramsey is a 33 year old male with reported psychiatric history of alcohol  abuse, PTSD, and depression. He presented voluntarily to [facility] reporting depressive symptoms and requesting alcohol  detox.   Patient evaluated on the unit. Reports sleep has been great, notes he has had some vivid dreams.  Reports appetite is good. States mood is pretty good today, notes he is mildy anxious about his next steps.   His goals for today include finishing a book, [place] [address] [place] [address] by Lynwood Gander. He has plans of completing phone screening for [employer].   Cravings are none.   On interview, suicidal ideations are not present. Thoughts of self harm are not present. Homicidal ideations are not present.   There are no auditory hallucinations, visual hallucinations, paranoid ideations, or delusional thought processes.   Side effects to currently prescribed medications are none. There are no somatic complaints. Reports regular bowel movements.. There are no withdrawals reported today.    Social HistoryNurse, Mental Health: Denies Access to firearms: Denies   Diagnosis:  Final diagnoses:  Severe episode of recurrent major depressive disorder, without psychotic features (HCC)  Alcohol  abuse  Substance induced mood disorder (HCC)    Total Time spent with patient: 1 hour  Pain Medications: See MAR Prescriptions: See MAR Over the Counter: See MAR History of alcohol  / drug use?: Yes Longest period of sobriety (when/how long): UTA Negative Consequences of Use: Personal relationships, Financial Withdrawal Symptoms: Sweats, Nausea / Vomiting, Tremors Name of Substance 1: ETOH 1 - Age of First Use: UTA 1 - Amount (size/oz): 1/5 liquor and 8-10 beers 1 - Frequency: 3-4 times per week 1 - Duration: UTA 1 - Last Use / Amount: 09/24/23 1 - Method of Aquiring: UTA 1- Route of  Use: UTA     Current Medications:  Current Facility-Administered Medications  Medication Dose Route Frequency Provider Last Rate Last Admin   acetaminophen  (TYLENOL ) tablet 650 mg  650 mg Oral Q6H PRN Ajibola, Ene A, NP   650 mg at 09/25/23 1352   alum & mag hydroxide-simeth (MAALOX/MYLANTA) 200-200-20 MG/5ML suspension 30 mL  30 mL Oral Q4H PRN Ajibola, Ene A, NP       chlordiazePOXIDE  (LIBRIUM ) capsule 25 mg  25 mg Oral TID Carrion-Carrero, Marlo, MD       Followed by   NOREEN ON 09/27/2023] chlordiazePOXIDE  (LIBRIUM ) capsule 25 mg  25 mg Oral BH-qamhs Carrion-Carrero, Ervin Rothbauer, MD       Followed by   NOREEN ON 09/28/2023] chlordiazePOXIDE  (LIBRIUM ) capsule 25 mg  25 mg Oral Daily Carrion-Carrero, Shannin Naab, MD       hydrOXYzine  (ATARAX ) tablet 25 mg  25 mg Oral Q6H PRN Ajibola, Ene A, NP       loperamide  (IMODIUM ) capsule 2-4 mg  2-4 mg Oral PRN Ajibola, Ene A, NP       LORazepam  (ATIVAN ) tablet 1 mg  1 mg Oral Q6H PRN Ajibola, Ene A, NP   1 mg at 09/24/23 2235   magnesium  hydroxide (MILK OF MAGNESIA) suspension 30 mL  30 mL Oral Daily PRN Ajibola, Ene A, NP       multivitamin with minerals tablet 1 tablet  1 tablet Oral Daily Ajibola, Ene A, NP   1 tablet at 09/25/23 9078   ondansetron  (ZOFRAN -ODT) disintegrating tablet 4 mg  4 mg Oral Q6H PRN Ajibola, Ene A, NP  sertraline  (ZOLOFT ) tablet 25 mg  25 mg Oral Daily Ajibola, Ene A, NP   25 mg at 09/25/23 9078   thiamine  (VITAMIN B1) tablet 100 mg  100 mg Oral Daily Ajibola, Ene A, NP   100 mg at 09/25/23 9078   traZODone  (DESYREL ) tablet 50 mg  50 mg Oral QHS PRN Ajibola, Ene A, NP   50 mg at 09/25/23 2114   Current Outpatient Medications  Medication Sig Dispense Refill   acetaminophen  (TYLENOL ) 500 MG tablet Take 2 tablets (1,000 mg total) by mouth every 6 (six) hours as needed. (Patient not taking: Reported on 09/25/2023)      Labs  Lab Results:  Admission on 09/24/2023  Component Date Value Ref Range Status   WBC 09/24/2023 4.7   4.0 - 10.5 K/uL Final   RBC 09/24/2023 4.94  4.22 - 5.81 MIL/uL Final   Hemoglobin 09/24/2023 15.8  13.0 - 17.0 g/dL Final   HCT 98/87/7974 44.4  39.0 - 52.0 % Final   MCV 09/24/2023 89.9  80.0 - 100.0 fL Final   MCH 09/24/2023 32.0  26.0 - 34.0 pg Final   MCHC 09/24/2023 35.6  30.0 - 36.0 g/dL Final   RDW 98/87/7974 13.3  11.5 - 15.5 % Final   Platelets 09/24/2023 313  150 - 400 K/uL Final   nRBC 09/24/2023 0.0  0.0 - 0.2 % Final   Neutrophils Relative % 09/24/2023 37  % Final   Neutro Abs 09/24/2023 1.7  1.7 - 7.7 K/uL Final   Lymphocytes Relative 09/24/2023 53  % Final   Lymphs Abs 09/24/2023 2.5  0.7 - 4.0 K/uL Final   Monocytes Relative 09/24/2023 9  % Final   Monocytes Absolute 09/24/2023 0.4  0.1 - 1.0 K/uL Final   Eosinophils Relative 09/24/2023 0  % Final   Eosinophils Absolute 09/24/2023 0.0  0.0 - 0.5 K/uL Final   Basophils Relative 09/24/2023 1  % Final   Basophils Absolute 09/24/2023 0.0  0.0 - 0.1 K/uL Final   Immature Granulocytes 09/24/2023 0  % Final   Abs Immature Granulocytes 09/24/2023 0.01  0.00 - 0.07 K/uL Final   Performed at [facility] Lab, [address] [place], [place] [address]   Sodium 09/24/2023 141  135 - 145 mmol/L Final   Potassium 09/24/2023 4.3  3.5 - 5.1 mmol/L Final   Chloride 09/24/2023 103  98 - 111 mmol/L Final   CO2 09/24/2023 28  22 - 32 mmol/L Final   Glucose, Bld 09/24/2023 87  70 - 99 mg/dL Final   Glucose reference range applies only to samples taken after fasting for at least 8 hours.   BUN 09/24/2023 8  6 - 20 mg/dL Final   Creatinine, Ser 09/24/2023 0.94  0.61 - 1.24 mg/dL Final   Calcium  09/24/2023 10.0  8.9 - 10.3 mg/dL Final   Total Protein 98/87/7974 7.8  6.5 - 8.1 g/dL Final   Albumin 98/87/7974 4.5  3.5 - 5.0 g/dL Final   AST 98/87/7974 51 (H)  15 - 41 U/L Final   ALT 09/24/2023 39  0 - 44 U/L Final   Alkaline Phosphatase 09/24/2023 124  38 - 126 U/L Final   Total Bilirubin 09/24/2023 1.4 (H)  0.0 - 1.2 mg/dL Final    GFR, Estimated 09/24/2023 >60  >60 mL/min Final   Comment: (NOTE) Calculated using the CKD-EPI Creatinine Equation (2021)    Anion gap 09/24/2023 10  5 - 15 Final   Performed at [facility],  [address], [place], [place] [address]   Hgb A1c MFr Bld 09/24/2023 5.2  4.8 - 5.6 % Final   Comment: (NOTE) Pre diabetes:          5.7%-6.4%  Diabetes:              >6.4%  Glycemic control for   <7.0% adults with diabetes    Mean Plasma Glucose 09/24/2023 102.54  mg/dL Final   Performed at [facility], [address], [place], [place] [address]   Alcohol , Ethyl (B) 09/24/2023 179 (H)  <10 mg/dL Final   Comment: (NOTE) Lowest detectable limit for serum alcohol  is 10 mg/dL.  For medical purposes only. Performed at [facility], [address] [place], [place] [address]    Cholesterol 09/24/2023 127  0 - 200 mg/dL Final   Triglycerides 98/87/7974 303 (H)  <150 mg/dL Final   HDL 98/87/7974 63  >40 mg/dL Final   Total CHOL/HDL Ratio 09/24/2023 2.0  RATIO Final   VLDL 09/24/2023 61 (H)  0 - 40 mg/dL Final   LDL Cholesterol 09/24/2023 3  0 - 99 mg/dL Final   Comment:        Total Cholesterol/HDL:CHD Risk Coronary Heart Disease Risk Table                     Men   Women  1/2 Average Risk   3.4   3.3  Average Risk       5.0   4.4  2 X Average Risk   9.6   7.1  3 X Average Risk  23.4   11.0        Use the calculated Patient Ratio above and the CHD Risk Table to determine the patient's CHD Risk.        ATP III CLASSIFICATION (LDL):  <100     mg/dL   Optimal  899-870  mg/dL   Near or Above                    Optimal  130-159  mg/dL   Borderline  839-810  mg/dL   High  >809     mg/dL   Very High Performed at [facility], [address], [place], [place] [address]    TSH 09/24/2023 1.228  0.350 - 4.500 uIU/mL Final   Comment: Performed by a 3rd Generation assay with a functional sensitivity of <=0.01 uIU/mL. Performed at [facility], [address], [place], [place] [address]    POC Amphetamine UR 09/24/2023 None Detected  NONE DETECTED (Cut Off Level 1000 ng/mL) Final   POC Secobarbital (BAR) 09/24/2023 None Detected  NONE DETECTED (Cut Off Level 300 ng/mL) Final   POC Buprenorphine (BUP) 09/24/2023 None Detected  NONE DETECTED (Cut Off Level 10 ng/mL) Final   POC Oxazepam (BZO) 09/24/2023 None Detected  NONE DETECTED (Cut Off Level 300 ng/mL) Final   POC Cocaine UR 09/24/2023 None Detected  NONE DETECTED (Cut Off Level 300 ng/mL) Final   POC Methamphetamine UR 09/24/2023 None Detected  NONE DETECTED (Cut Off Level 1000 ng/mL) Final   POC Morphine  09/24/2023 None Detected  NONE DETECTED (Cut Off Level 300 ng/mL) Final   POC Methadone UR 09/24/2023 None Detected  NONE DETECTED (Cut Off Level 300 ng/mL) Final   POC Oxycodone  UR 09/24/2023 None Detected  NONE DETECTED (Cut Off Level 100 ng/mL) Final   POC Marijuana UR 09/24/2023 None Detected  NONE  DETECTED (Cut Off Level 50 ng/mL) Final  Admission on 08/03/2023, Discharged on 08/04/2023  Component Date Value Ref Range Status   Sodium 08/03/2023 140  135 - 145 mmol/L Final   Potassium 08/03/2023 4.0  3.5 - 5.1 mmol/L Final   Chloride 08/03/2023 101  98 - 111 mmol/L Final   CO2 08/03/2023 21 (L)  22 - 32 mmol/L Final   Glucose, Bld 08/03/2023 122 (H)  70 - 99 mg/dL Final   Glucose reference range applies only to samples taken after fasting for at least 8 hours.   BUN 08/03/2023 7  6 - 20 mg/dL Final   Creatinine, Ser 08/03/2023 1.35 (H)  0.61 - 1.24 mg/dL Final   Calcium  08/03/2023 9.4  8.9 - 10.3 mg/dL Final   Total Protein 88/78/7975 7.8  6.5 - 8.1 g/dL Final   Albumin 88/78/7975 4.5  3.5 - 5.0 g/dL Final   AST 88/78/7975 230 (H)  15 - 41 U/L Final   ALT 08/03/2023 92 (H)  0 - 44 U/L Final   Alkaline Phosphatase 08/03/2023 83  38 - 126 U/L Final   Total Bilirubin 08/03/2023 1.4 (H)  <1.2 mg/dL Final   GFR, Estimated 08/03/2023 >60  >60 mL/min Final   Comment: (NOTE) Calculated  using the CKD-EPI Creatinine Equation (2021)    Anion gap 08/03/2023 18 (H)  5 - 15 Final   Performed at [facility], [address], [place], [place] [address]   Sodium 08/03/2023 140  135 - 145 mmol/L Final   Potassium 08/03/2023 4.0  3.5 - 5.1 mmol/L Final   Chloride 08/03/2023 103  98 - 111 mmol/L Final   BUN 08/03/2023 6  6 - 20 mg/dL Final   Creatinine, Ser 08/03/2023 1.50 (H)  0.61 - 1.24 mg/dL Final   Glucose, Bld 88/78/7975 120 (H)  70 - 99 mg/dL Final   Glucose reference range applies only to samples taken after fasting for at least 8 hours.   Calcium , Ion 08/03/2023 1.03 (L)  1.15 - 1.40 mmol/L Final   TCO2 08/03/2023 21 (L)  22 - 32 mmol/L Final   Hemoglobin 08/03/2023 16.7  13.0 - 17.0 g/dL Final   HCT 88/78/7975 49.0  39.0 - 52.0 % Final   WBC 08/03/2023 7.8  4.0 - 10.5 K/uL Final   RBC 08/03/2023 4.84  4.22 - 5.81 MIL/uL Final   Hemoglobin 08/03/2023 15.9  13.0 - 17.0 g/dL Final   HCT 88/78/7975 47.3  39.0 - 52.0 % Final   MCV 08/03/2023 97.7  80.0 - 100.0 fL Final   MCH 08/03/2023 32.9  26.0 - 34.0 pg Final   MCHC 08/03/2023 33.6  30.0 - 36.0 g/dL Final   RDW 88/78/7975 13.3  11.5 - 15.5 % Final   Platelets 08/03/2023 275  150 - 400 K/uL Final   nRBC 08/03/2023 0.0  0.0 - 0.2 % Final   Performed at [facility], [address], [place], [place] [address]   Alcohol , Ethyl (B) 08/03/2023 152 (H)  <10 mg/dL Final   Comment: (NOTE) Lowest detectable limit for serum alcohol  is 10 mg/dL.  For medical purposes only. Performed at [facility], [address], [place], [place] [address]    Color, Urine 08/03/2023 YELLOW  YELLOW Final   APPearance 08/03/2023 CLEAR  CLEAR Final   Specific Gravity, Urine 08/03/2023 >1.046 (H)  1.005 - 1.030 Final   pH 08/03/2023 5.0  5.0 - 8.0 Final   Glucose, UA 08/03/2023 NEGATIVE  NEGATIVE mg/dL Final   Hgb urine dipstick 08/03/2023 MODERATE (A)  NEGATIVE Final   Bilirubin Urine 08/03/2023 NEGATIVE  NEGATIVE Final    Ketones, ur 08/03/2023 5 (A)  NEGATIVE mg/dL Final   Protein, ur 88/78/7975 100 (A)  NEGATIVE mg/dL Final   Nitrite 88/78/7975 NEGATIVE  NEGATIVE Final   Leukocytes,Ua 08/03/2023 NEGATIVE  NEGATIVE Final   RBC / HPF 08/03/2023 0-5  0 - 5 RBC/hpf Final   WBC, UA 08/03/2023 0-5  0 - 5 WBC/hpf Final   Bacteria, UA 08/03/2023 RARE (A)  NONE SEEN Final   Squamous Epithelial / HPF 08/03/2023 0-5  0 - 5 /HPF Final   Mucus 08/03/2023 PRESENT   Final   Hyaline Casts, UA 08/03/2023 PRESENT   Final   Performed at [facility], [address], [place], [place] [address]   Lactic Acid, Venous 08/03/2023 7.4 (HH)  0.5 - 1.9 mmol/L Final   Comment 08/03/2023 NOTIFIED PHYSICIAN   Final   Prothrombin Time 08/03/2023 14.0  11.4 - 15.2 seconds Final   INR 08/03/2023 1.1  0.8 - 1.2 Final   Comment: (NOTE) INR goal varies based on device and disease states. Performed at [facility], [address] [place], [place] [address]    Blood Bank Specimen 08/03/2023 SAMPLE AVAILABLE FOR TESTING   Final   Sample Expiration 08/03/2023    Final                   Value:08/06/2023,2359 Performed at [facility], [address], [place], [place] [address]    Opiates 08/03/2023 NONE DETECTED  NONE DETECTED Final   Cocaine 08/03/2023 NONE DETECTED  NONE DETECTED Final   Benzodiazepines 08/03/2023 NONE DETECTED  NONE DETECTED Final   Amphetamines 08/03/2023 NONE DETECTED  NONE DETECTED Final   Tetrahydrocannabinol 08/03/2023 POSITIVE (A)  NONE DETECTED Final   Barbiturates 08/03/2023 NONE DETECTED  NONE DETECTED Final   Comment: (NOTE) DRUG SCREEN FOR MEDICAL PURPOSES ONLY.  IF CONFIRMATION IS NEEDED FOR ANY PURPOSE, NOTIFY LAB WITHIN 5 DAYS.  LOWEST DETECTABLE LIMITS FOR URINE DRUG SCREEN Drug Class                     Cutoff (ng/mL) Amphetamine and metabolites    1000 Barbiturate and metabolites    200 Benzodiazepine                 200 Opiates and metabolites        300 Cocaine and  metabolites        300 THC                            50 Performed at [facility], [address] [place], [place] [address]    WBC 08/04/2023 9.2  4.0 - 10.5 K/uL Final   RBC 08/04/2023 3.91 (L)  4.22 - 5.81 MIL/uL Final   Hemoglobin 08/04/2023 12.9 (L)  13.0 - 17.0 g/dL Final   HCT 88/77/7975 37.0 (L)  39.0 - 52.0 % Final   MCV 08/04/2023 94.6  80.0 - 100.0 fL Final   MCH 08/04/2023 33.0  26.0 - 34.0 pg Final   MCHC 08/04/2023 34.9  30.0 - 36.0 g/dL Final   RDW 88/77/7975 13.1  11.5 - 15.5 % Final   Platelets 08/04/2023 211  150 - 400 K/uL Final   nRBC 08/04/2023 0.0  0.0 - 0.2 % Final  Performed at [facility], [address] [place], [place] [address]   Sodium 08/04/2023 133 (L)  135 - 145 mmol/L Final   Comment: DELTA CHECK NOTED REPEATED TO VERIFY    Potassium 08/04/2023 3.8  3.5 - 5.1 mmol/L Final   Chloride 08/04/2023 98  98 - 111 mmol/L Final   CO2 08/04/2023 24  22 - 32 mmol/L Final   Glucose, Bld 08/04/2023 134 (H)  70 - 99 mg/dL Final   Glucose reference range applies only to samples taken after fasting for at least 8 hours.   BUN 08/04/2023 7  6 - 20 mg/dL Final   Creatinine, Ser 08/04/2023 0.95  0.61 - 1.24 mg/dL Final   Calcium  08/04/2023 8.7 (L)  8.9 - 10.3 mg/dL Final   GFR, Estimated 08/04/2023 >60  >60 mL/min Final   Comment: (NOTE) Calculated using the CKD-EPI Creatinine Equation (2021)    Anion gap 08/04/2023 11  5 - 15 Final   Performed at [facility], [address], [place], [place] [address]   Magnesium  08/04/2023 1.5 (L)  1.7 - 2.4 mg/dL Final   Performed at [facility], [address] [place], [place] [address]   Phosphorus 08/04/2023 3.8  2.5 - 4.6 mg/dL Final   Performed at [facility], [address], [place], [place] [address]   HIV Screen 4th Generation wRfx 08/04/2023 Non Reactive  Non Reactive Final   Performed at [facility], [address] [place], [place] [address]   Total Protein 08/04/2023  6.3 (L)  6.5 - 8.1 g/dL Final   Albumin 88/77/7975 3.6  3.5 - 5.0 g/dL Final   AST 88/77/7975 134 (H)  15 - 41 U/L Final   ALT 08/04/2023 73 (H)  0 - 44 U/L Final   Alkaline Phosphatase 08/04/2023 62  38 - 126 U/L Final   Total Bilirubin 08/04/2023 1.6 (H)  <1.2 mg/dL Final   Bilirubin, Direct 08/04/2023 0.3 (H)  0.0 - 0.2 mg/dL Final   Indirect Bilirubin 08/04/2023 1.3 (H)  0.3 - 0.9 mg/dL Final   Performed at [facility], [address] [place], [place] [address]  Admission on 05/10/2023, Discharged on 05/10/2023  Component Date Value Ref Range Status   Lipase 05/10/2023 33  11 - 51 U/L Final   Performed at [facility], [address], [place], [place] [address]   Sodium 05/10/2023 140  135 - 145 mmol/L Final   Potassium 05/10/2023 3.0 (L)  3.5 - 5.1 mmol/L Final   Chloride 05/10/2023 104  98 - 111 mmol/L Final   CO2 05/10/2023 23  22 - 32 mmol/L Final   Glucose, Bld 05/10/2023 153 (H)  70 - 99 mg/dL Final   Glucose reference range applies only to samples taken after fasting for at least 8 hours.   BUN 05/10/2023 <5 (L)  6 - 20 mg/dL Final   Creatinine, Ser 05/10/2023 1.06  0.61 - 1.24 mg/dL Final   Calcium  05/10/2023 8.8 (L)  8.9 - 10.3 mg/dL Final   Total Protein 91/71/7975 7.4  6.5 - 8.1 g/dL Final   Albumin 91/71/7975 4.2  3.5 - 5.0 g/dL Final   AST 91/71/7975 32  15 - 41 U/L Final   ALT 05/10/2023 31  0 - 44 U/L Final   Alkaline Phosphatase 05/10/2023 85  38 - 126 U/L Final   Total Bilirubin 05/10/2023 1.3 (H)  0.3 - 1.2 mg/dL Final   GFR,  Estimated 05/10/2023 >60  >60 mL/min Final   Comment: (NOTE) Calculated using the CKD-EPI Creatinine Equation (2021)    Anion gap 05/10/2023 13  5 - 15 Final   Performed at [facility], [address], [place], [place] [address]   WBC 05/10/2023 6.1  4.0 - 10.5 K/uL Final   RBC 05/10/2023 4.56  4.22 - 5.81 MIL/uL Final   Hemoglobin 05/10/2023 14.9  13.0 - 17.0 g/dL Final   HCT 91/71/7975 41.6  39.0 - 52.0 % Final    MCV 05/10/2023 91.2  80.0 - 100.0 fL Final   MCH 05/10/2023 32.7  26.0 - 34.0 pg Final   MCHC 05/10/2023 35.8  30.0 - 36.0 g/dL Final   RDW 91/71/7975 15.6 (H)  11.5 - 15.5 % Final   Platelets 05/10/2023 254  150 - 400 K/uL Final   nRBC 05/10/2023 0.0  0.0 - 0.2 % Final   Performed at [facility], [address], [place], [place] [address]  Admission on 04/05/2023, Discharged on 04/07/2023  Component Date Value Ref Range Status   Lipase 04/05/2023 54 (H)  11 - 51 U/L Final   Performed at [facility], [address], [place], [place] [address]   Sodium 04/05/2023 133 (L)  135 - 145 mmol/L Final   Potassium 04/05/2023 5.1  3.5 - 5.1 mmol/L Final   HEMOLYSIS AT THIS LEVEL MAY AFFECT RESULT   Chloride 04/05/2023 91 (L)  98 - 111 mmol/L Final   CO2 04/05/2023 <7 (L)  22 - 32 mmol/L Final   Glucose, Bld 04/05/2023 222 (H)  70 - 99 mg/dL Final   Glucose reference range applies only to samples taken after fasting for at least 8 hours.   BUN 04/05/2023 13  6 - 20 mg/dL Final   Creatinine, Ser 04/05/2023 1.44 (H)  0.61 - 1.24 mg/dL Final   Calcium  04/05/2023 8.6 (L)  8.9 - 10.3 mg/dL Final   Total Protein 92/75/7975 7.8  6.5 - 8.1 g/dL Final   Albumin 92/75/7975 4.4  3.5 - 5.0 g/dL Final   AST 92/75/7975 133 (H)  15 - 41 U/L Final   HEMOLYSIS AT THIS LEVEL MAY AFFECT RESULT   ALT 04/05/2023 86 (H)  0 - 44 U/L Final   HEMOLYSIS AT THIS LEVEL MAY AFFECT RESULT   Alkaline Phosphatase 04/05/2023 86  38 - 126 U/L Final   Total Bilirubin 04/05/2023 1.0  0.3 - 1.2 mg/dL Final   HEMOLYSIS AT THIS LEVEL MAY AFFECT RESULT   GFR, Estimated 04/05/2023 >60  >60 mL/min Final   Comment: (NOTE) Calculated using the CKD-EPI Creatinine Equation (2021)    Anion gap 04/05/2023 NOT CALCULATED  5 - 15 Final   Performed at [facility], [address] [place], [place] [address]   WBC 04/05/2023 11.2 (H)  4.0 - 10.5 K/uL Final   RBC 04/05/2023 4.71  4.22 - 5.81 MIL/uL Final   Hemoglobin  04/05/2023 15.1  13.0 - 17.0 g/dL Final   HCT 92/75/7975 44.8  39.0 - 52.0 % Final   MCV 04/05/2023 95.1  80.0 - 100.0 fL Final   MCH 04/05/2023 32.1  26.0 - 34.0 pg Final   MCHC 04/05/2023 33.7  30.0 - 36.0 g/dL Final   RDW 92/75/7975 14.3  11.5 - 15.5 % Final   Platelets 04/05/2023 340  150 - 400 K/uL Final   nRBC 04/05/2023 0.0  0.0 - 0.2 % Final   Performed at [facility], [address], [place], [place] [address]   Alcohol , Ethyl (B) 04/05/2023 47 (H)  <10 mg/dL Final   Comment: (NOTE) Lowest detectable limit for serum alcohol  is 10 mg/dL.  For medical purposes only. Performed at [facility], [address], [place], [place] [address]    Salicylate Lvl 04/05/2023 <7.0 (L)  7.0 - 30.0 mg/dL Final   Performed at [facility] Lab, [address], [place], [place] [address]   Acetaminophen  (Tylenol ), Serum 04/05/2023 <10 (L)  10 - 30 ug/mL Final   Comment: (NOTE) Therapeutic concentrations vary significantly. A range of 10-30 ug/mL  may be an effective concentration for many patients. However, some  are best treated at concentrations outside of this range. Acetaminophen  concentrations >150 ug/mL at 4 hours after ingestion  and >50 ug/mL at 12 hours after ingestion are often associated with  toxic reactions.  Performed at [facility] Lab, [address] [place], [place] [address]    ABO/RH(D) 04/05/2023 O POS   Final   Antibody Screen 04/05/2023 NEG   Final   Sample Expiration 04/05/2023    Final                   Value:04/08/2023,2359 Performed at [facility], [address] [place], [place] [address]    ABO/RH(D) 04/05/2023    Final                   Value:O POS Performed at [facility], [address], [place], [place] [address]    WBC 04/06/2023 5.9  4.0 - 10.5 K/uL Final   RBC 04/06/2023 3.80 (L)  4.22 - 5.81 MIL/uL Final   Hemoglobin 04/06/2023 12.6 (L)  13.0 - 17.0 g/dL Final   HCT 92/74/7975 34.8 (L)  39.0 - 52.0 % Final   MCV  04/06/2023 91.6  80.0 - 100.0 fL Final   MCH 04/06/2023 33.2  26.0 - 34.0 pg Final   MCHC 04/06/2023 36.2 (H)  30.0 - 36.0 g/dL Final   RDW 92/74/7975 14.1  11.5 - 15.5 % Final   Platelets 04/06/2023 235  150 - 400 K/uL Final   nRBC 04/06/2023 0.0  0.0 - 0.2 % Final   Neutrophils Relative % 04/06/2023 71  % Final   Neutro Abs 04/06/2023 4.2  1.7 - 7.7 K/uL Final   Lymphocytes Relative 04/06/2023 18  % Final   Lymphs Abs 04/06/2023 1.1  0.7 - 4.0 K/uL Final   Monocytes Relative 04/06/2023 11  % Final   Monocytes Absolute 04/06/2023 0.6  0.1 - 1.0 K/uL Final   Eosinophils Relative 04/06/2023 0  % Final   Eosinophils Absolute 04/06/2023 0.0  0.0 - 0.5 K/uL Final   Basophils Relative 04/06/2023 0  % Final   Basophils Absolute 04/06/2023 0.0  0.0 - 0.1 K/uL Final   Immature Granulocytes 04/06/2023 0  % Final   Abs Immature Granulocytes 04/06/2023 0.02  0.00 - 0.07 K/uL Final   Performed at [facility], [address], [place], [place] [address]   Sodium 04/06/2023 134 (L)  135 - 145 mmol/L Final   Potassium 04/06/2023 3.7  3.5 - 5.1 mmol/L Final   Chloride 04/06/2023 98  98 - 111 mmol/L Final   CO2 04/06/2023 24  22 - 32 mmol/L Final   Glucose, Bld 04/06/2023 119 (H)  70 - 99 mg/dL Final   Glucose reference range applies only to samples taken after fasting for  at least 8 hours.   BUN 04/06/2023 8  6 - 20 mg/dL Final   Creatinine, Ser 04/06/2023 1.13  0.61 - 1.24 mg/dL Final   Calcium  04/06/2023 8.9  8.9 - 10.3 mg/dL Final   Total Protein 92/74/7975 6.2 (L)  6.5 - 8.1 g/dL Final   Albumin 92/74/7975 3.6  3.5 - 5.0 g/dL Final   AST 92/74/7975 82 (H)  15 - 41 U/L Final   ALT 04/06/2023 66 (H)  0 - 44 U/L Final   Alkaline Phosphatase 04/06/2023 61  38 - 126 U/L Final   Total Bilirubin 04/06/2023 2.9 (H)  0.3 - 1.2 mg/dL Final   GFR, Estimated 04/06/2023 >60  >60 mL/min Final   Comment: (NOTE) Calculated using the CKD-EPI Creatinine Equation (2021)    Anion gap 04/06/2023 12  5 -  15 Final   Performed at [facility], [address] [place], [place] [address]   Magnesium  04/06/2023 1.8  1.7 - 2.4 mg/dL Final   Performed at [facility], [address], [place], [place] [address]   Phosphorus 04/06/2023 2.7  2.5 - 4.6 mg/dL Final   Performed at [facility], [address] [place], [place] [address]   Hemoglobin 04/06/2023 12.3 (L)  13.0 - 17.0 g/dL Final   HCT 92/74/7975 34.6 (L)  39.0 - 52.0 % Final   Performed at [facility], [address], [place], [place] [address]   aPTT 04/06/2023 23 (L)  24 - 36 seconds Final   Performed at [facility], [address] [place], [place] [address]   Prothrombin Time 04/06/2023 13.7  11.4 - 15.2 seconds Final   INR 04/06/2023 1.0  0.8 - 1.2 Final   Comment: (NOTE) INR goal varies based on device and disease states. Performed at [facility], [address], [place], [place] [address]    WBC 04/07/2023 6.5  4.0 - 10.5 K/uL Final   RBC 04/07/2023 3.85 (L)  4.22 - 5.81 MIL/uL Final   Hemoglobin 04/07/2023 13.0  13.0 - 17.0 g/dL Final   HCT 92/73/7975 35.2 (L)  39.0 - 52.0 % Final   MCV 04/07/2023 91.4  80.0 - 100.0 fL Final   MCH 04/07/2023 33.8  26.0 - 34.0 pg Final   MCHC 04/07/2023 36.9 (H)  30.0 - 36.0 g/dL Final   RDW 92/73/7975 13.9  11.5 - 15.5 % Final   Platelets 04/07/2023 204  150 - 400 K/uL Final   nRBC 04/07/2023 0.0  0.0 - 0.2 % Final   Neutrophils Relative % 04/07/2023 64  % Final   Neutro Abs 04/07/2023 4.1  1.7 - 7.7 K/uL Final   Lymphocytes Relative 04/07/2023 30  % Final   Lymphs Abs 04/07/2023 2.0  0.7 - 4.0 K/uL Final   Monocytes Relative 04/07/2023 6  % Final   Monocytes Absolute 04/07/2023 0.4  0.1 - 1.0 K/uL Final   Eosinophils Relative 04/07/2023 0  % Final   Eosinophils Absolute 04/07/2023 0.0  0.0 - 0.5 K/uL Final   Basophils Relative 04/07/2023 0  % Final   Basophils Absolute 04/07/2023 0.0  0.0 - 0.1 K/uL Final   Immature Granulocytes 04/07/2023 0  %  Final   Abs Immature Granulocytes 04/07/2023 0.01  0.00 - 0.07 K/uL Final   Performed at [facility], [address], [place], [place] [address]   SURGICAL PATHOLOGY 04/07/2023    Final-Edited  Value:SURGICAL PATHOLOGY CASE: [id number] PATIENT: PENNE ABATE Surgical Pathology Report     Clinical History: Hematemesis, esophagitis; r/o H pylori and erosive esophagitis (nt)     FINAL MICROSCOPIC DIAGNOSIS:  A. STOMACH, BIOPSY: Fundic type gastric mucosa showing reactive gastropathy with minimal chronic gastritis Negative for H. pylori, intestinal metaplasia, dysplasia and carcinoma  B. ESOPHAGEAL BIOPSY: Acute esophagitis Negative for glandular epithelium, eosinophils, dysplasia and carcinoma Fungal stain negative (PASF, adequate control)   GROSS DESCRIPTION:  A. Received in formalin labeled with the patients name and Gastric bx is a 0.9 x 0.8 x 0.2 cm aggregate of tan soft tissue fragments, submitted in toto in a single cassette.  B. Received in formalin labeled with the patients name and Esophageal bx are three 0.1-0.4 cm pieces of tan soft tissue, submitted in toto in a single cassette.  (LEF 04/07/2023)   Final Diagnosis performed                          by Prentice Pitcher, MD.   Electronically signed 04/11/2023 Technical and / or Professional components performed at [facility]. [facility], [address], [place], [place] [address].  Immunohistochemistry Technical component (if applicable) was performed at [facility]. [address], [place], [place] [address].   IMMUNOHISTOCHEMISTRY DISCLAIMER (if applicable): Some of these immunohistochemical stains may have been developed and the performance characteristics determine by [facility]. Some may not have been cleared or approved by the U.S. Food and Drug Administration. The FDA has determined that such clearance or approval is  not necessary. This test is used for clinical purposes. It should not be regarded as investigational or for research. This laboratory is certified under the Clinical Laboratory Improvement Amendments of 1988 (CLIA-88) as qualified to perform high complexity clinical laboratory testing.  The controls                          stained appropriately.   IHC stains are performed on formalin fixed, paraffin embedded tissue using a 3,3diaminobenzidine (DAB) chromogen and Leica Bond Autostainer System. The staining intensity of the nucleus is score manually and is reported as the percentage of tumor cell nuclei demonstrating specific nuclear staining. The specimens are fixed in 10% Neutral Formalin for at least 6 hours and up to 72hrs. These tests are validated on decalcified tissue. Results should be interpreted with caution given the possibility of false negative results on decalcified specimens. Antibody Clones are as follows ER-clone 50F, PR-clone 16, Ki67- clone MM1. Some of these immunohistochemical stains may have been developed and the performance characteristics determined by [facility].     Blood Alcohol  level:  Lab Results  Component Value Date   ETH 179 (H) 09/24/2023   ETH 152 (H) 08/03/2023    Metabolic Disorder Labs: Lab Results  Component Value Date   HGBA1C 5.2 09/24/2023   MPG 102.54 09/24/2023   MPG 93.93 01/17/2023   No results found for: PROLACTIN Lab Results  Component Value Date   CHOL 127 09/24/2023   TRIG 303 (H) 09/24/2023   HDL 63 09/24/2023   CHOLHDL 2.0 09/24/2023   VLDL 61 (H) 09/24/2023   LDLCALC 3 09/24/2023   LDLCALC 68 01/17/2023    Therapeutic Lab Levels: No results found for: LITHIUM No results found for: VALPROATE No results found for: CBMZ  Physical Findings   CAGE-AID    Flowsheet Row ED to Hosp-Admission (Discharged)  from 08/03/2023 in [place] [facility]  CAGE-AID Score 4      GAD-7     Flowsheet Row Video Visit from 07/06/2021 in [facility] Office Visit from 10/21/2020 in [facility] at [facility]  Total GAD-7 Score 16 0      PHQ2-9    Flowsheet Row ED from 01/16/2023 in [facility] Counselor from 07/26/2022 in [facility] Video Visit from 07/06/2021 in [facility] Office Visit from 01/12/2021 in [facility] at [facility] Office Visit from 11/12/2020 in [facility]  PHQ-2 Total Score 4 5 2 2  0  PHQ-9 Total Score 6 20 14 9 4       Flowsheet Row ED from 09/24/2023 in [name] ED to Hosp-Admission (Discharged) from 08/03/2023 in [place] [facility] ED from 05/10/2023 in [facility] Department at [facility]  C-SSRS RISK CATEGORY No Risk No Risk No Risk        Musculoskeletal  Strength & Muscle Tone: within normal limits Gait & Station: normal Patient leans: N/A  Psychiatric Specialty Exam  Presentation  General Appearance:  Appropriate for Environment  Eye Contact: Good  Speech: Clear and Coherent  Speech Volume: Normal  Handedness: Not assessed   Mood and Affect  Mood: Good  Affect: Congruent, bright affect   Thought Process  Thought Processes: Coherent  Descriptions of Associations:Intact  Orientation:Full (Time, Place and Person)  Thought Content:WDL  Diagnosis of Schizophrenia or Schizoaffective disorder in past: No    Hallucinations:Denies  Ideas of Reference:None  Suicidal Thoughts:Denies  Homicidal Thoughts:Denies   Sensorium  Memory: Immediate Good; Recent Good; Remote Good  Judgment: Fair  Insight: Good   Executive Functions  Concentration: Good  Attention Span: Good  Recall: Good  Fund of Knowledge: Good  Language: Good   Psychomotor Activity   Psychomotor Activity: Good   Assets  Assets: Communication Skills; Desire for Improvement; Housing; Physical Health; Social Support   Sleep  Sleep: Good    Physical Exam  Physical Exam Vitals and nursing note reviewed.  Constitutional:      General: He is not in acute distress.    Appearance: He is not ill-appearing.  HENT:     Head: Normocephalic and atraumatic.  Eyes:     Conjunctiva/sclera: Conjunctivae normal.  Pulmonary:     Effort: Pulmonary effort is normal. No respiratory distress.  Musculoskeletal:        General: Normal range of motion.  Skin:    General: Skin is warm and dry.    Review of Systems  All other systems reviewed and are negative.  Blood pressure (!) 137/92, pulse 79, temperature 97.9 F (36.6 C), temperature source Oral, resp. rate 18, SpO2 100%. There is no height or weight on file to calculate BMI.  Treatment Plan Summary: Daily contact with patient to assess and evaluate symptoms and progress in treatment and Medication management    Labs:  - UDS is negative  - Ethanol level 179 on 09/24/2023 - TSH WNL - Lipid panel showing elevated TGs 303, VLDL 61 - EKG showing QTc 460, Wolff-Parkinson-White  - Continue sertraline  25 mg for depression - CMP showing elevation in AST 51, normal ALT 39, elevated total bilirubin 1.4 - A1c 5.2%  CIWA Protocol CIWA score is 4 at 5 PM on 09/24/2022 - started librium  taper -  lorazepam  1 mg every 6 hours prn for CIWA >10 -thiamine  100 mg daily for nutritional supplementation -hydroxyzine  25 mg every 6 hours prn for anxiety, CIWA < or = 10 -ondansetron  4 mg ODT every 6 hours prn nausea/vomiting -loperamide  2-4 mg capsule prn diarrhea or loose stools    Signed: Marlo Masson, MD 09/26/2023 8:28 AM

## 2023-09-27 MED ORDER — TRAZODONE HCL 100 MG PO TABS: 100.0000 mg | ORAL_TABLET | Freq: Every evening | ORAL | 0 refills | Status: AC | PRN

## 2023-09-27 MED ORDER — NICOTINE POLACRILEX 2 MG MT GUM
2.0000 mg | CHEWING_GUM | OROMUCOSAL | Status: DC | PRN
  Administered 2023-09-27: 2 mg via ORAL
  Filled 2023-09-27: qty 1

## 2023-09-27 MED ORDER — ACETAMINOPHEN 325 MG PO TABS: 650.0000 mg | ORAL_TABLET | Freq: Four times a day (QID) | ORAL | Status: AC | PRN

## 2023-09-27 MED ORDER — TRAZODONE HCL 100 MG PO TABS
100.0000 mg | ORAL_TABLET | Freq: Every evening | ORAL | Status: DC | PRN
  Administered 2023-09-27: 100 mg via ORAL
  Filled 2023-09-27: qty 1
  Filled 2023-09-27: qty 14

## 2023-09-27 MED ORDER — SERTRALINE HCL 25 MG PO TABS: 25.0000 mg | ORAL_TABLET | Freq: Every day | ORAL | 0 refills | Status: AC

## 2023-09-27 MED ORDER — NICOTINE POLACRILEX 2 MG MT GUM: 2.0000 mg | CHEWING_GUM | OROMUCOSAL | 0 refills | Status: AC | PRN

## 2023-09-27 NOTE — ED Notes (Signed)
Patient A&Ox4. Patient denies SI/HI and AVH. Patient denies any physical complaints when asked. No acute distress noted. Support and encouragement provided. Routine safety checks conducted according to facility protocol. Encouraged patient to notify staff if thoughts of harm toward self or others arise. Patient verbalize understanding and agreement. Will continue to monitor for safety. Q 15 min safety checks remain in place.    

## 2023-09-27 NOTE — Group Note (Signed)
 Group Topic: Understanding Self  Group Date: 09/27/2023 Start Time: 0900 End Time: 1000 Facilitators: Candis Kabel, [place], RN; Denzil Flatten, RN  Department: [facility]  Number of Participants: 5  Group Focus: nursing group Treatment Modality:  Individual Therapy Interventions utilized were leisure development Purpose: explore maladaptive thinking and increase insight  Name: Xavier Ramsey Date of Birth: 1991/02/02  MR: [id number]    Level of Participation: active Quality of Participation: attentive and cooperative Interactions with others: gave feedback Mood/Affect: appropriate Triggers (if applicable): none identified Cognition: coherent/clear Progress: Moderate Response:Patient stated " I am doing this for me and to be a better man for my son" Plan: patient will be encouraged to continue to explore self awareness  Patients Problems:  Patient Active Problem List   Diagnosis Date Noted   Trauma 08/03/2023   Epigastric pain 04/07/2023   Acute esophagitis 04/07/2023   Gastritis and gastroduodenitis 04/07/2023   Acute upper GI bleed 04/06/2023   Nausea & vomiting 04/06/2023   AKI (acute kidney injury) (HCC) 04/06/2023   Transaminitis 04/06/2023   Leukocytosis 04/06/2023   Chronic alcohol  abuse 04/06/2023   Tobacco abuse 04/06/2023   GIB (gastrointestinal bleeding) 04/06/2023   Alcohol  use disorder, severe, dependence (HCC) 01/17/2023   MDD (major depressive disorder), recurrent episode (HCC) 07/26/2022   Anxiety 07/26/2022   Postconcussive syndrome 07/26/2022   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

## 2023-09-27 NOTE — ED Provider Notes (Signed)
 Behavioral Health Progress Note  Date and Time: 09/27/2023 11:36 AM Name: Xavier Ramsey MRN:  [id number]  Subjective:  Xavier Ramsey is a 33 year old male with reported psychiatric history of alcohol  abuse, PTSD, and depression. He presented voluntarily to [facility] reporting depressive symptoms and requesting alcohol  detox.   Patient evaluated on the unit. Reports sleep was disrupted last night. Agrees to have his trazodone  increased. Reports appetite is good. States mood is "good" today.  He is anticipating discharge to [facility] tomorrow and reports feeling nervous about this new step in his life.  Overall he reports feeling well.  Cravings are still present for vaping.   On interview, suicidal ideations are not present. Thoughts of self harm are not present. Homicidal ideations are not present.   There are no auditory hallucinations, visual hallucinations, paranoid ideations, or delusional thought processes.   Side effects to currently prescribed medications are none. There are no somatic complaints. Reports regular bowel movements. There are no withdrawals reported today.    Social HistoryNurse, mental health: Denies Access to firearms: Denies   Diagnosis:  Final diagnoses:  Severe episode of recurrent major depressive disorder, without psychotic features (HCC)  Alcohol  abuse  Substance induced mood disorder (HCC)    Total Time spent with patient: 1 hour  Pain Medications: See MAR Prescriptions: See MAR Over the Counter: See MAR History of alcohol  / drug use?: Yes Longest period of sobriety (when/how long): UTA Negative Consequences of Use: Personal relationships, Financial Withdrawal Symptoms: Sweats, Nausea / Vomiting, Tremors Name of Substance 1: ETOH 1 - Age of First Use: UTA 1 - Amount (size/oz): 1/5 liquor and 8-10 beers 1 - Frequency: 3-4 times per week 1 - Duration: UTA 1 - Last Use / Amount: 09/24/23 1 - Method of Aquiring: UTA 1- Route of Use: UTA     Current  Medications:  Current Facility-Administered Medications  Medication Dose Route Frequency Provider Last Rate Last Admin   acetaminophen  (TYLENOL ) tablet 650 mg  650 mg Oral Q6H PRN Ajibola, Ene A, NP   650 mg at 09/26/23 0934   alum & mag hydroxide-simeth (MAALOX/MYLANTA) 200-200-20 MG/5ML suspension 30 mL  30 mL Oral Q4H PRN Ajibola, Ene A, NP       chlordiazePOXIDE  (LIBRIUM ) capsule 25 mg  25 mg Oral BH-qamhs Carrion-Carrero, Shantale Holtmeyer, MD   25 mg at 09/27/23 0920   Followed by   Cecily Cohen ON 09/28/2023] chlordiazePOXIDE  (LIBRIUM ) capsule 25 mg  25 mg Oral Daily Carrion-Carrero, Eriel Dunckel, MD       hydrOXYzine  (ATARAX ) tablet 25 mg  25 mg Oral Q6H PRN Ajibola, Ene A, NP   25 mg at 09/26/23 0934   loperamide  (IMODIUM ) capsule 2-4 mg  2-4 mg Oral PRN Ajibola, Ene A, NP       LORazepam  (ATIVAN ) tablet 1 mg  1 mg Oral Q6H PRN Ajibola, Ene A, NP   1 mg at 09/24/23 2235   magnesium  hydroxide (MILK OF MAGNESIA) suspension 30 mL  30 mL Oral Daily PRN Ajibola, Ene A, NP       multivitamin with minerals tablet 1 tablet  1 tablet Oral Daily Ajibola, Ene A, NP   1 tablet at 09/27/23 0920   nicotine  polacrilex (NICORETTE ) gum 2 mg  2 mg Oral PRN Carrion-Carrero, Julyana Woolverton, MD       ondansetron  (ZOFRAN -ODT) disintegrating tablet 4 mg  4 mg Oral Q6H PRN Ajibola, Ene A, NP       sertraline  (ZOLOFT ) tablet 25 mg  25 mg Oral Daily Ajibola,  Ene A, NP   25 mg at 09/27/23 0920   thiamine  (VITAMIN B1) tablet 100 mg  100 mg Oral Daily Ajibola, Ene A, NP   100 mg at 09/27/23 0920   traZODone  (DESYREL ) tablet 100 mg  100 mg Oral QHS PRN Carrion-Carrero, Jacalyn Martin, MD       Current Outpatient Medications  Medication Sig Dispense Refill   acetaminophen  (TYLENOL ) 325 MG tablet Take 2 tablets (650 mg total) by mouth every 6 (six) hours as needed for mild pain (pain score 1-3).     nicotine  polacrilex (NICORETTE ) 2 MG gum Take 1 each (2 mg total) by mouth as needed for smoking cessation. 100 tablet 0   [START ON 09/28/2023] sertraline   (ZOLOFT ) 25 MG tablet Take 1 tablet (25 mg total) by mouth daily. 30 tablet 0   traZODone  (DESYREL ) 100 MG tablet Take 1 tablet (100 mg total) by mouth at bedtime as needed for sleep. 30 tablet 0    Labs  Lab Results:  Admission on 09/24/2023  Component Date Value Ref Range Status   WBC 09/24/2023 4.7  4.0 - 10.5 K/uL Final   RBC 09/24/2023 4.94  4.22 - 5.81 MIL/uL Final   Hemoglobin 09/24/2023 15.8  13.0 - 17.0 g/dL Final   HCT 10/96/0454 44.4  39.0 - 52.0 % Final   MCV 09/24/2023 89.9  80.0 - 100.0 fL Final   MCH 09/24/2023 32.0  26.0 - 34.0 pg Final   MCHC 09/24/2023 35.6  30.0 - 36.0 g/dL Final   RDW 09/81/1914 13.3  11.5 - 15.5 % Final   Platelets 09/24/2023 313  150 - 400 K/uL Final   nRBC 09/24/2023 0.0  0.0 - 0.2 % Final   Neutrophils Relative % 09/24/2023 37  % Final   Neutro Abs 09/24/2023 1.7  1.7 - 7.7 K/uL Final   Lymphocytes Relative 09/24/2023 53  % Final   Lymphs Abs 09/24/2023 2.5  0.7 - 4.0 K/uL Final   Monocytes Relative 09/24/2023 9  % Final   Monocytes Absolute 09/24/2023 0.4  0.1 - 1.0 K/uL Final   Eosinophils Relative 09/24/2023 0  % Final   Eosinophils Absolute 09/24/2023 0.0  0.0 - 0.5 K/uL Final   Basophils Relative 09/24/2023 1  % Final   Basophils Absolute 09/24/2023 0.0  0.0 - 0.1 K/uL Final   Immature Granulocytes 09/24/2023 0  % Final   Abs Immature Granulocytes 09/24/2023 0.01  0.00 - 0.07 K/uL Final   Performed at [facility] Lab, [address] [place], [place] [address]   Sodium 09/24/2023 141  135 - 145 mmol/L Final   Potassium 09/24/2023 4.3  3.5 - 5.1 mmol/L Final   Chloride 09/24/2023 103  98 - 111 mmol/L Final   CO2 09/24/2023 28  22 - 32 mmol/L Final   Glucose, Bld 09/24/2023 87  70 - 99 mg/dL Final   Glucose reference range applies only to samples taken after fasting for at least 8 hours.   BUN 09/24/2023 8  6 - 20 mg/dL Final   Creatinine, Ser 09/24/2023 0.94  0.61 - 1.24 mg/dL Final   Calcium  09/24/2023 10.0  8.9 - 10.3 mg/dL  Final   Total Protein 09/24/2023 7.8  6.5 - 8.1 g/dL Final   Albumin 62/13/0865 4.5  3.5 - 5.0 g/dL Final   AST 78/46/9629 51 (H)  15 - 41 U/L Final   ALT 09/24/2023 39  0 - 44 U/L Final   Alkaline Phosphatase 09/24/2023 124  38 - 126 U/L  Final   Total Bilirubin 09/24/2023 1.4 (H)  0.0 - 1.2 mg/dL Final   GFR, Estimated 09/24/2023 >60  >60 mL/min Final   Comment: (NOTE) Calculated using the CKD-EPI Creatinine Equation (2021)    Anion gap 09/24/2023 10  5 - 15 Final   Performed at [facility] Lab, [address], [place], [place] [address]   Hgb A1c MFr Bld 09/24/2023 5.2  4.8 - 5.6 % Final   Comment: (NOTE) Pre diabetes:          5.7%-6.4%  Diabetes:              >6.4%  Glycemic control for   <7.0% adults with diabetes    Mean Plasma Glucose 09/24/2023 102.54  mg/dL Final   Performed at [facility] Lab, [address], [place], [place] [address]   Alcohol , Ethyl (B) 09/24/2023 179 (H)  <10 mg/dL Final   Comment: (NOTE) Lowest detectable limit for serum alcohol  is 10 mg/dL.  For medical purposes only. Performed at [facility] Lab, [address], [place], [place] [address]    Cholesterol 09/24/2023 127  0 - 200 mg/dL Final   Triglycerides 56/38/7564 303 (H)  <150 mg/dL Final   HDL 33/29/5188 63  >40 mg/dL Final   Total CHOL/HDL Ratio 09/24/2023 2.0  RATIO Final   VLDL 09/24/2023 61 (H)  0 - 40 mg/dL Final   LDL Cholesterol 09/24/2023 3  0 - 99 mg/dL Final   Comment:        Total Cholesterol/HDL:CHD Risk Coronary Heart Disease Risk Table                     Men   Women  1/2 Average Risk   3.4   3.3  Average Risk       5.0   4.4  2 X Average Risk   9.6   7.1  3 X Average Risk  23.4   11.0        Use the calculated Patient Ratio above and the CHD Risk Table to determine the patient's CHD Risk.        ATP III CLASSIFICATION (LDL):  <100     mg/dL   Optimal  416-606  mg/dL   Near or Above                    Optimal  130-159  mg/dL   Borderline  301-601   mg/dL   High  >093     mg/dL   Very High Performed at [facility] Lab, [address], [place], [place] [address]    TSH 09/24/2023 1.228  0.350 - 4.500 uIU/mL Final   Comment: Performed by a 3rd Generation assay with a functional sensitivity of <=0.01 uIU/mL. Performed at [facility] Lab, [address] [place], [place] [address]    POC Amphetamine UR 09/24/2023 None Detected  NONE DETECTED (Cut Off Level 1000 ng/mL) Final   POC Secobarbital (BAR) 09/24/2023 None Detected  NONE DETECTED (Cut Off Level 300 ng/mL) Final   POC Buprenorphine (BUP) 09/24/2023 None Detected  NONE DETECTED (Cut Off Level 10 ng/mL) Final   POC Oxazepam (BZO) 09/24/2023 None Detected  NONE DETECTED (Cut Off Level 300 ng/mL) Final   POC Cocaine UR 09/24/2023 None Detected  NONE DETECTED (Cut Off Level 300 ng/mL) Final   POC Methamphetamine UR 09/24/2023 None Detected  NONE DETECTED (Cut Off Level 1000 ng/mL) Final   POC Morphine  09/24/2023  None Detected  NONE DETECTED (Cut Off Level 300 ng/mL) Final   POC Methadone UR 09/24/2023 None Detected  NONE DETECTED (Cut Off Level 300 ng/mL) Final   POC Oxycodone  UR 09/24/2023 None Detected  NONE DETECTED (Cut Off Level 100 ng/mL) Final   POC Marijuana UR 09/24/2023 None Detected  NONE DETECTED (Cut Off Level 50 ng/mL) Final  Admission on 08/03/2023, Discharged on 08/04/2023  Component Date Value Ref Range Status   Sodium 08/03/2023 140  135 - 145 mmol/L Final   Potassium 08/03/2023 4.0  3.5 - 5.1 mmol/L Final   Chloride 08/03/2023 101  98 - 111 mmol/L Final   CO2 08/03/2023 21 (L)  22 - 32 mmol/L Final   Glucose, Bld 08/03/2023 122 (H)  70 - 99 mg/dL Final   Glucose reference range applies only to samples taken after fasting for at least 8 hours.   BUN 08/03/2023 7  6 - 20 mg/dL Final   Creatinine, Ser 08/03/2023 1.35 (H)  0.61 - 1.24 mg/dL Final   Calcium  08/03/2023 9.4  8.9 - 10.3 mg/dL Final   Total Protein 16/06/9603 7.8  6.5 - 8.1 g/dL Final   Albumin  54/05/8118 4.5  3.5 - 5.0 g/dL Final   AST 14/78/2956 230 (H)  15 - 41 U/L Final   ALT 08/03/2023 92 (H)  0 - 44 U/L Final   Alkaline Phosphatase 08/03/2023 83  38 - 126 U/L Final   Total Bilirubin 08/03/2023 1.4 (H)  <1.2 mg/dL Final   GFR, Estimated 08/03/2023 >60  >60 mL/min Final   Comment: (NOTE) Calculated using the CKD-EPI Creatinine Equation (2021)    Anion gap 08/03/2023 18 (H)  5 - 15 Final   Performed at [facility] Lab, [address], [place], [place] [address]   Sodium 08/03/2023 140  135 - 145 mmol/L Final   Potassium 08/03/2023 4.0  3.5 - 5.1 mmol/L Final   Chloride 08/03/2023 103  98 - 111 mmol/L Final   BUN 08/03/2023 6  6 - 20 mg/dL Final   Creatinine, Ser 08/03/2023 1.50 (H)  0.61 - 1.24 mg/dL Final   Glucose, Bld 65/78/4696 120 (H)  70 - 99 mg/dL Final   Glucose reference range applies only to samples taken after fasting for at least 8 hours.   Calcium , Ion 08/03/2023 1.03 (L)  1.15 - 1.40 mmol/L Final   TCO2 08/03/2023 21 (L)  22 - 32 mmol/L Final   Hemoglobin 08/03/2023 16.7  13.0 - 17.0 g/dL Final   HCT 29/52/8413 49.0  39.0 - 52.0 % Final   WBC 08/03/2023 7.8  4.0 - 10.5 K/uL Final   RBC 08/03/2023 4.84  4.22 - 5.81 MIL/uL Final   Hemoglobin 08/03/2023 15.9  13.0 - 17.0 g/dL Final   HCT 24/40/1027 47.3  39.0 - 52.0 % Final   MCV 08/03/2023 97.7  80.0 - 100.0 fL Final   MCH 08/03/2023 32.9  26.0 - 34.0 pg Final   MCHC 08/03/2023 33.6  30.0 - 36.0 g/dL Final   RDW 25/36/6440 13.3  11.5 - 15.5 % Final   Platelets 08/03/2023 275  150 - 400 K/uL Final   nRBC 08/03/2023 0.0  0.0 - 0.2 % Final   Performed at [facility] Lab, [address], [place], [place] [address]   Alcohol , Ethyl (B) 08/03/2023 152 (H)  <10 mg/dL Final   Comment: (NOTE) Lowest detectable limit for serum alcohol  is 10 mg/dL.  For medical purposes only. Performed at [facility]  Lab, [address] [place], [place] [address]    Color, Urine 08/03/2023 YELLOW  YELLOW Final    APPearance 08/03/2023 CLEAR  CLEAR Final   Specific Gravity, Urine 08/03/2023 >1.046 (H)  1.005 - 1.030 Final   pH 08/03/2023 5.0  5.0 - 8.0 Final   Glucose, UA 08/03/2023 NEGATIVE  NEGATIVE mg/dL Final   Hgb urine dipstick 08/03/2023 MODERATE (A)  NEGATIVE Final   Bilirubin Urine 08/03/2023 NEGATIVE  NEGATIVE Final   Ketones, ur 08/03/2023 5 (A)  NEGATIVE mg/dL Final   Protein, ur 40/98/1191 100 (A)  NEGATIVE mg/dL Final   Nitrite 47/82/9562 NEGATIVE  NEGATIVE Final   Leukocytes,Ua 08/03/2023 NEGATIVE  NEGATIVE Final   RBC / HPF 08/03/2023 0-5  0 - 5 RBC/hpf Final   WBC, UA 08/03/2023 0-5  0 - 5 WBC/hpf Final   Bacteria, UA 08/03/2023 RARE (A)  NONE SEEN Final   Squamous Epithelial / HPF 08/03/2023 0-5  0 - 5 /HPF Final   Mucus 08/03/2023 PRESENT   Final   Hyaline Casts, UA 08/03/2023 PRESENT   Final   Performed at [facility], [address] [place], [place] [address]   Lactic Acid, Venous 08/03/2023 7.4 (HH)  0.5 - 1.9 mmol/L Final   Comment 08/03/2023 NOTIFIED PHYSICIAN   Final   Prothrombin Time 08/03/2023 14.0  11.4 - 15.2 seconds Final   INR 08/03/2023 1.1  0.8 - 1.2 Final   Comment: (NOTE) INR goal varies based on device and disease states. Performed at [facility] Lab, [address], [place], [place] [address]    Blood Bank Specimen 08/03/2023 SAMPLE AVAILABLE FOR TESTING   Final   Sample Expiration 08/03/2023    Final                   Value:08/06/2023,2359 Performed at [facility] Lab, [address], [place], [place] [address]    Opiates 08/03/2023 NONE DETECTED  NONE DETECTED Final   Cocaine 08/03/2023 NONE DETECTED  NONE DETECTED Final   Benzodiazepines 08/03/2023 NONE DETECTED  NONE DETECTED Final   Amphetamines 08/03/2023 NONE DETECTED  NONE DETECTED Final   Tetrahydrocannabinol 08/03/2023 POSITIVE (A)  NONE DETECTED Final   Barbiturates 08/03/2023 NONE DETECTED  NONE DETECTED Final   Comment: (NOTE) DRUG SCREEN FOR MEDICAL PURPOSES ONLY.  IF  CONFIRMATION IS NEEDED FOR ANY PURPOSE, NOTIFY LAB WITHIN 5 DAYS.  LOWEST DETECTABLE LIMITS FOR URINE DRUG SCREEN Drug Class                     Cutoff (ng/mL) Amphetamine and metabolites    1000 Barbiturate and metabolites    200 Benzodiazepine                 200 Opiates and metabolites        300 Cocaine and metabolites        300 THC                            50 Performed at [facility] Lab, [address], [place], [place] [address]    WBC 08/04/2023 9.2  4.0 - 10.5 K/uL Final   RBC 08/04/2023 3.91 (L)  4.22 - 5.81 MIL/uL Final   Hemoglobin 08/04/2023 12.9 (L)  13.0 - 17.0 g/dL Final   HCT 24/40/1027 37.0 (L)  39.0 - 52.0 % Final   MCV 08/04/2023 94.6  80.0 - 100.0 fL Final  MCH 08/04/2023 33.0  26.0 - 34.0 pg Final   MCHC 08/04/2023 34.9  30.0 - 36.0 g/dL Final   RDW 40/98/1191 13.1  11.5 - 15.5 % Final   Platelets 08/04/2023 211  150 - 400 K/uL Final   nRBC 08/04/2023 0.0  0.0 - 0.2 % Final   Performed at [facility] Lab, [address], [place], [place] [address]   Sodium 08/04/2023 133 (L)  135 - 145 mmol/L Final   Comment: DELTA CHECK NOTED REPEATED TO VERIFY    Potassium 08/04/2023 3.8  3.5 - 5.1 mmol/L Final   Chloride 08/04/2023 98  98 - 111 mmol/L Final   CO2 08/04/2023 24  22 - 32 mmol/L Final   Glucose, Bld 08/04/2023 134 (H)  70 - 99 mg/dL Final   Glucose reference range applies only to samples taken after fasting for at least 8 hours.   BUN 08/04/2023 7  6 - 20 mg/dL Final   Creatinine, Ser 08/04/2023 0.95  0.61 - 1.24 mg/dL Final   Calcium  08/04/2023 8.7 (L)  8.9 - 10.3 mg/dL Final   GFR, Estimated 08/04/2023 >60  >60 mL/min Final   Comment: (NOTE) Calculated using the CKD-EPI Creatinine Equation (2021)    Anion gap 08/04/2023 11  5 - 15 Final   Performed at [facility], [address] [place], [place] [address]   Magnesium  08/04/2023 1.5 (L)  1.7 - 2.4 mg/dL Final   Performed at [facility], [address], [place], [place]  [address]   Phosphorus 08/04/2023 3.8  2.5 - 4.6 mg/dL Final   Performed at [facility], [address], [place], [place] [address]   HIV Screen 4th Generation wRfx 08/04/2023 Non Reactive  Non Reactive Final   Performed at [facility], [address] [place], [place] [address]   Total Protein 08/04/2023 6.3 (L)  6.5 - 8.1 g/dL Final   Albumin 41/32/4401 3.6  3.5 - 5.0 g/dL Final   AST 02/72/5366 134 (H)  15 - 41 U/L Final   ALT 08/04/2023 73 (H)  0 - 44 U/L Final   Alkaline Phosphatase 08/04/2023 62  38 - 126 U/L Final   Total Bilirubin 08/04/2023 1.6 (H)  <1.2 mg/dL Final   Bilirubin, Direct 08/04/2023 0.3 (H)  0.0 - 0.2 mg/dL Final   Indirect Bilirubin 08/04/2023 1.3 (H)  0.3 - 0.9 mg/dL Final   Performed at [facility] Lab, [address] [place], [place] [address]  Admission on 05/10/2023, Discharged on 05/10/2023  Component Date Value Ref Range Status   Lipase 05/10/2023 33  11 - 51 U/L Final   Performed at [facility], [address], [place], [place] [address]   Sodium 05/10/2023 140  135 - 145 mmol/L Final   Potassium 05/10/2023 3.0 (L)  3.5 - 5.1 mmol/L Final   Chloride 05/10/2023 104  98 - 111 mmol/L Final   CO2 05/10/2023 23  22 - 32 mmol/L Final   Glucose, Bld 05/10/2023 153 (H)  70 - 99 mg/dL Final   Glucose reference range applies only to samples taken after fasting for at least 8 hours.   BUN 05/10/2023 <5 (L)  6 - 20 mg/dL Final   Creatinine, Ser 05/10/2023 1.06  0.61 - 1.24 mg/dL Final   Calcium  05/10/2023 8.8 (L)  8.9 - 10.3 mg/dL Final   Total Protein 56/38/7564 7.4  6.5 - 8.1 g/dL Final   Albumin 33/29/5188 4.2  3.5 - 5.0 g/dL Final   AST 86/57/8469 32  15 - 41 U/L Final   ALT 05/10/2023 31  0 - 44 U/L Final   Alkaline Phosphatase 05/10/2023 85  38 - 126 U/L Final   Total Bilirubin 05/10/2023 1.3 (H)  0.3 - 1.2 mg/dL Final   GFR, Estimated 05/10/2023 >60  >60 mL/min Final   Comment: (NOTE) Calculated using the CKD-EPI Creatinine Equation  (2021)    Anion gap 05/10/2023 13  5 - 15 Final   Performed at [facility] Lab, [address] [place], [place] [address]   WBC 05/10/2023 6.1  4.0 - 10.5 K/uL Final   RBC 05/10/2023 4.56  4.22 - 5.81 MIL/uL Final   Hemoglobin 05/10/2023 14.9  13.0 - 17.0 g/dL Final   HCT 84/13/2440 41.6  39.0 - 52.0 % Final   MCV 05/10/2023 91.2  80.0 - 100.0 fL Final   MCH 05/10/2023 32.7  26.0 - 34.0 pg Final   MCHC 05/10/2023 35.8  30.0 - 36.0 g/dL Final   RDW 07/09/2535 15.6 (H)  11.5 - 15.5 % Final   Platelets 05/10/2023 254  150 - 400 K/uL Final   nRBC 05/10/2023 0.0  0.0 - 0.2 % Final   Performed at [facility] Lab, [address] [place], [place] [address]  Admission on 04/05/2023, Discharged on 04/07/2023  Component Date Value Ref Range Status   Lipase 04/05/2023 54 (H)  11 - 51 U/L Final   Performed at [facility] Lab, [address] [place], [place] [address]   Sodium 04/05/2023 133 (L)  135 - 145 mmol/L Final   Potassium 04/05/2023 5.1  3.5 - 5.1 mmol/L Final   HEMOLYSIS AT THIS LEVEL MAY AFFECT RESULT   Chloride 04/05/2023 91 (L)  98 - 111 mmol/L Final   CO2 04/05/2023 <7 (L)  22 - 32 mmol/L Final   Glucose, Bld 04/05/2023 222 (H)  70 - 99 mg/dL Final   Glucose reference range applies only to samples taken after fasting for at least 8 hours.   BUN 04/05/2023 13  6 - 20 mg/dL Final   Creatinine, Ser 04/05/2023 1.44 (H)  0.61 - 1.24 mg/dL Final   Calcium  04/05/2023 8.6 (L)  8.9 - 10.3 mg/dL Final   Total Protein 95/63/8756 7.8  6.5 - 8.1 g/dL Final   Albumin 43/32/9518 4.4  3.5 - 5.0 g/dL Final   AST 84/16/6063 133 (H)  15 - 41 U/L Final   HEMOLYSIS AT THIS LEVEL MAY AFFECT RESULT   ALT 04/05/2023 86 (H)  0 - 44 U/L Final   HEMOLYSIS AT THIS LEVEL MAY AFFECT RESULT   Alkaline Phosphatase 04/05/2023 86  38 - 126 U/L Final   Total Bilirubin 04/05/2023 1.0  0.3 - 1.2 mg/dL Final   HEMOLYSIS AT THIS LEVEL MAY AFFECT RESULT   GFR, Estimated 04/05/2023 >60  >60 mL/min Final    Comment: (NOTE) Calculated using the CKD-EPI Creatinine Equation (2021)    Anion gap 04/05/2023 NOT CALCULATED  5 - 15 Final   Performed at [facility] Lab, [address] [place], [place] [address]   WBC 04/05/2023 11.2 (H)  4.0 - 10.5 K/uL Final   RBC 04/05/2023 4.71  4.22 - 5.81 MIL/uL Final   Hemoglobin 04/05/2023 15.1  13.0 - 17.0 g/dL Final   HCT 09/32/3557 44.8  39.0 - 52.0 % Final   MCV 04/05/2023 95.1  80.0 - 100.0 fL Final   MCH 04/05/2023 32.1  26.0 - 34.0 pg Final   MCHC 04/05/2023 33.7  30.0 - 36.0 g/dL Final   RDW 16/06/9603 14.3  11.5 - 15.5 % Final   Platelets 04/05/2023 340  150 - 400 K/uL Final   nRBC 04/05/2023 0.0  0.0 - 0.2 % Final   Performed at [facility] Lab, [address] [place], [place] [address]   Alcohol , Ethyl (B) 04/05/2023 47 (H)  <10 mg/dL Final   Comment: (NOTE) Lowest detectable limit for serum alcohol  is 10 mg/dL.  For medical purposes only. Performed at [facility] Lab, [address], [place], [place] [address]    Salicylate Lvl 04/05/2023 <7.0 (L)  7.0 - 30.0 mg/dL Final   Performed at [facility] Lab, [address], [place], [place] [address]   Acetaminophen  (Tylenol ), Serum 04/05/2023 <10 (L)  10 - 30 ug/mL Final   Comment: (NOTE) Therapeutic concentrations vary significantly. A range of 10-30 ug/mL  may be an effective concentration for many patients. However, some  are best treated at concentrations outside of this range. Acetaminophen  concentrations >150 ug/mL at 4 hours after ingestion  and >50 ug/mL at 12 hours after ingestion are often associated with  toxic reactions.  Performed at [facility] Lab, [address] [place], [place] [address]    ABO/RH(D) 04/05/2023 O POS   Final   Antibody Screen 04/05/2023 NEG   Final   Sample Expiration 04/05/2023    Final                   Value:04/08/2023,2359 Performed at [facility] Lab, [address] [place], [place] [address]    ABO/RH(D) 04/05/2023    Final                    Value:O POS Performed at [facility] Lab, [address] [place], [place] [address]    WBC 04/06/2023 5.9  4.0 - 10.5 K/uL Final   RBC 04/06/2023 3.80 (L)  4.22 - 5.81 MIL/uL Final   Hemoglobin 04/06/2023 12.6 (L)  13.0 - 17.0 g/dL Final   HCT 95/28/4132 34.8 (L)  39.0 - 52.0 % Final   MCV 04/06/2023 91.6  80.0 - 100.0 fL Final   MCH 04/06/2023 33.2  26.0 - 34.0 pg Final   MCHC 04/06/2023 36.2 (H)  30.0 - 36.0 g/dL Final   RDW 44/09/270 14.1  11.5 - 15.5 % Final   Platelets 04/06/2023 235  150 - 400 K/uL Final   nRBC 04/06/2023 0.0  0.0 - 0.2 % Final   Neutrophils Relative % 04/06/2023 71  % Final   Neutro Abs 04/06/2023 4.2  1.7 - 7.7 K/uL Final   Lymphocytes Relative 04/06/2023 18  % Final   Lymphs Abs 04/06/2023 1.1  0.7 - 4.0 K/uL Final   Monocytes Relative 04/06/2023 11  % Final   Monocytes Absolute 04/06/2023 0.6  0.1 - 1.0 K/uL Final   Eosinophils Relative 04/06/2023 0  % Final   Eosinophils Absolute 04/06/2023 0.0  0.0 - 0.5 K/uL Final   Basophils Relative 04/06/2023 0  % Final   Basophils Absolute 04/06/2023 0.0  0.0 - 0.1 K/uL Final   Immature Granulocytes 04/06/2023 0  % Final   Abs Immature Granulocytes 04/06/2023 0.02  0.00 - 0.07 K/uL Final   Performed at [facility], [address], [place], [place] [address]   Sodium 04/06/2023 134 (L)  135 - 145 mmol/L Final   Potassium 04/06/2023  3.7  3.5 - 5.1 mmol/L Final   Chloride 04/06/2023 98  98 - 111 mmol/L Final   CO2 04/06/2023 24  22 - 32 mmol/L Final   Glucose, Bld 04/06/2023 119 (H)  70 - 99 mg/dL Final   Glucose reference range applies only to samples taken after fasting for at least 8 hours.   BUN 04/06/2023 8  6 - 20 mg/dL Final   Creatinine, Ser 04/06/2023 1.13  0.61 - 1.24 mg/dL Final   Calcium  04/06/2023 8.9  8.9 - 10.3 mg/dL Final   Total Protein 16/06/9603 6.2 (L)  6.5 - 8.1 g/dL Final   Albumin 54/05/8118 3.6  3.5 - 5.0 g/dL Final   AST 14/78/2956 82 (H)  15 - 41 U/L Final   ALT  04/06/2023 66 (H)  0 - 44 U/L Final   Alkaline Phosphatase 04/06/2023 61  38 - 126 U/L Final   Total Bilirubin 04/06/2023 2.9 (H)  0.3 - 1.2 mg/dL Final   GFR, Estimated 04/06/2023 >60  >60 mL/min Final   Comment: (NOTE) Calculated using the CKD-EPI Creatinine Equation (2021)    Anion gap 04/06/2023 12  5 - 15 Final   Performed at [facility], [address], [place], [place] [address]   Magnesium  04/06/2023 1.8  1.7 - 2.4 mg/dL Final   Performed at [facility], [address], [place], [place] [address]   Phosphorus 04/06/2023 2.7  2.5 - 4.6 mg/dL Final   Performed at [facility], [address] [place], [place] [address]   Hemoglobin 04/06/2023 12.3 (L)  13.0 - 17.0 g/dL Final   HCT 52/84/1324 34.6 (L)  39.0 - 52.0 % Final   Performed at [facility], [address] [place], [place] [address]   aPTT 04/06/2023 23 (L)  24 - 36 seconds Final   Performed at [facility], [address] [place], [place] [address]   Prothrombin Time 04/06/2023 13.7  11.4 - 15.2 seconds Final   INR 04/06/2023 1.0  0.8 - 1.2 Final   Comment: (NOTE) INR goal varies based on device and disease states. Performed at [facility], [address], [place], [place] [address]    WBC 04/07/2023 6.5  4.0 - 10.5 K/uL Final   RBC 04/07/2023 3.85 (L)  4.22 - 5.81 MIL/uL Final   Hemoglobin 04/07/2023 13.0  13.0 - 17.0 g/dL Final   HCT 47/42/5956 35.2 (L)  39.0 - 52.0 % Final   MCV 04/07/2023 91.4  80.0 - 100.0 fL Final   MCH 04/07/2023 33.8  26.0 - 34.0 pg Final   MCHC 04/07/2023 36.9 (H)  30.0 - 36.0 g/dL Final   RDW 38/75/6433 13.9  11.5 - 15.5 % Final   Platelets 04/07/2023 204  150 - 400 K/uL Final   nRBC 04/07/2023 0.0  0.0 - 0.2 % Final   Neutrophils Relative % 04/07/2023 64  % Final   Neutro Abs 04/07/2023 4.1  1.7 - 7.7 K/uL Final   Lymphocytes Relative 04/07/2023 30  % Final   Lymphs Abs 04/07/2023 2.0  0.7 - 4.0 K/uL Final   Monocytes Relative 04/07/2023 6  %  Final   Monocytes Absolute 04/07/2023 0.4  0.1 - 1.0 K/uL Final   Eosinophils Relative 04/07/2023 0  % Final   Eosinophils Absolute 04/07/2023 0.0  0.0 - 0.5 K/uL Final   Basophils Relative 04/07/2023 0  % Final   Basophils Absolute 04/07/2023 0.0  0.0 - 0.1  K/uL Final   Immature Granulocytes 04/07/2023 0  % Final   Abs Immature Granulocytes 04/07/2023 0.01  0.00 - 0.07 K/uL Final   Performed at [facility], [address] [place], [place] [address]   SURGICAL PATHOLOGY 04/07/2023    Final-Edited                   Value:SURGICAL PATHOLOGY CASE: [id number] PATIENT: Tolbert Fothergill Surgical Pathology Report     Clinical History: Hematemesis, esophagitis; r/o H pylori and erosive esophagitis (nt)     FINAL MICROSCOPIC DIAGNOSIS:  A. STOMACH, BIOPSY: Fundic type gastric mucosa showing reactive gastropathy with minimal chronic gastritis Negative for H. pylori, intestinal metaplasia, dysplasia and carcinoma  B. ESOPHAGEAL BIOPSY: Acute esophagitis Negative for glandular epithelium, eosinophils, dysplasia and carcinoma Fungal stain negative (PASF, adequate control)   GROSS DESCRIPTION:  A. Received in formalin labeled with the patients name and "Gastric bx" is a 0.9 x 0.8 x 0.2 cm aggregate of tan soft tissue fragments, submitted in toto in a single cassette.  B. Received in formalin labeled with the patients name and "Esophageal bx" are three 0.1-0.4 cm pieces of tan soft tissue, submitted in toto in a single cassette.  (LEF 04/07/2023)   Final Diagnosis performed                          by Judithann Novas, MD.   Electronically signed 04/11/2023 Technical and / or Professional components performed at [facility]. [facility], [address], [place], [place] [address].  Immunohistochemistry Technical component (if applicable) was performed at [facility]. [address], [place], [place] [address].   IMMUNOHISTOCHEMISTRY  DISCLAIMER (if applicable): Some of these immunohistochemical stains may have been developed and the performance characteristics determine by [facility]. Some may not have been cleared or approved by the U.S. Food and Drug Administration. The FDA has determined that such clearance or approval is not necessary. This test is used for clinical purposes. It should not be regarded as investigational or for research. This laboratory is certified under the Clinical Laboratory Improvement Amendments of 1988 (CLIA-88) as qualified to perform high complexity clinical laboratory testing.  The controls                          stained appropriately.   IHC stains are performed on formalin fixed, paraffin embedded tissue using a 3,3"diaminobenzidine (DAB) chromogen and Leica Bond Autostainer System. The staining intensity of the nucleus is score manually and is reported as the percentage of tumor cell nuclei demonstrating specific nuclear staining. The specimens are fixed in 10% Neutral Formalin for at least 6 hours and up to 72hrs. These tests are validated on decalcified tissue. Results should be interpreted with caution given the possibility of false negative results on decalcified specimens. Antibody Clones are as follows ER-clone 77F, PR-clone 16, Ki67- clone MM1. Some of these immunohistochemical stains may have been developed and the performance characteristics determined by [facility].     Blood Alcohol  level:  Lab Results  Component Value Date   ETH 179 (H) 09/24/2023   ETH 152 (H) 08/03/2023    Metabolic Disorder Labs: Lab Results  Component Value Date   HGBA1C 5.2 09/24/2023   MPG 102.54 09/24/2023   MPG 93.93 01/17/2023   No results found for: "PROLACTIN" Lab Results  Component Value Date   CHOL 127 09/24/2023  TRIG 303 (H) 09/24/2023   HDL 63 09/24/2023   CHOLHDL 2.0 09/24/2023   VLDL 61 (H) 09/24/2023   LDLCALC 3 09/24/2023   LDLCALC 68  01/17/2023    Therapeutic Lab Levels: No results found for: "LITHIUM" No results found for: "VALPROATE" No results found for: "CBMZ"  Physical Findings   CAGE-AID    Flowsheet Row ED to Hosp-Admission (Discharged) from 08/03/2023 in [place] [facility]  CAGE-AID Score 4      GAD-7    Flowsheet Row Video Visit from 07/06/2021 in [facility] at [facility] Office Visit from 10/21/2020 in [facility] Primary Care at [facility]  Total GAD-7 Score 16 0      PHQ2-9    Flowsheet Row ED from 01/16/2023 in [facility] Counselor from 07/26/2022 in [facility] Video Visit from 07/06/2021 in [facility] Primary Care at [facility] Office Visit from 01/12/2021 in [facility] Primary Care at [facility] Office Visit from 11/12/2020 in [facility]  PHQ-2 Total Score 4 5 2 2  0  PHQ-9 Total Score 6 20 14 9 4       Flowsheet Row ED from 09/24/2023 in [facility] ED to Hosp-Admission (Discharged) from 08/03/2023 in [place] [facility] ED from 05/10/2023 in [facility] Emergency Department at [facility]  C-SSRS RISK CATEGORY No Risk No Risk No Risk        Musculoskeletal  Strength & Muscle Tone: within normal limits Gait & Station: normal Patient leans: N/A  Psychiatric Specialty Exam  Presentation  General Appearance:  Appropriate for Environment  Eye Contact: Good  Speech: Clear and Coherent; Normal Rate  Speech Volume: Normal  Handedness: Not assessed   Mood and Affect  Mood: "Good"  Affect: Congruent, bright affect   Thought Process  Thought Processes: Linear  Descriptions of Associations:Intact  Orientation:None  Thought Content:Logical  Diagnosis of Schizophrenia or Schizoaffective disorder in past: No    Hallucinations:Denies  Ideas of Reference:None  Suicidal  Thoughts:Denies  Homicidal Thoughts:Denies   Sensorium  Memory: Immediate Good; Recent Good; Remote Good  Judgment: Fair  Insight: Fair   Art therapist  Concentration: Fair  Attention Span: Fair  Recall: [employer] of Knowledge: Fair  Language: Fair   Psychomotor Activity  Psychomotor Activity: Good   Assets  Assets: Desire for Improvement; Resilience; Communication Skills   Sleep  Sleep: Fair    Physical Exam  Physical Exam Vitals and nursing note reviewed.  Constitutional:      General: He is not in acute distress.    Appearance: He is not ill-appearing.  HENT:     Head: Normocephalic and atraumatic.  Eyes:     Conjunctiva/sclera: Conjunctivae normal.  Pulmonary:     Effort: Pulmonary effort is normal. No respiratory distress.  Musculoskeletal:        General: Normal range of motion.  Skin:    General: Skin is warm and dry.    Review of Systems  All other systems reviewed and are negative.  Blood pressure 133/84, pulse 67, temperature 97.9 F (36.6 C), temperature source Oral, resp. rate 17, SpO2 100%. There is no height or weight on file to calculate BMI.  Treatment Plan Summary: Daily contact with patient to assess and evaluate symptoms and progress in treatment and Medication management    Labs:  - UDS is negative  - Ethanol level 179 on 09/24/2023 -  TSH WNL - Lipid panel showing elevated TGs 303, VLDL 61 - EKG showing QTc 460, Wolff-Parkinson-White  - Continue sertraline  25 mg for depression - CMP showing elevation in AST 51, normal ALT 39, elevated total bilirubin 1.4 - A1c 5.2%  CIWA Protocol CIWA score is 0 at 12 PM on 09/26/2022, will discontinue -Complete Librium  taper today -lorazepam  1 mg every 6 hours prn for CIWA >10 -thiamine  100 mg daily for nutritional supplementation -hydroxyzine  25 mg every 6 hours prn for anxiety, CIWA < or = 10 -ondansetron  4 mg ODT every 6 hours prn nausea/vomiting -loperamide  2-4  mg capsule prn diarrhea or loose stools  Dispo: Discharge to [facility] on 1/16   Signed: Baltazar Bonier, MD 09/27/2023 11:36 AM

## 2023-09-27 NOTE — ED Notes (Signed)
 Xavier Ramsey is calm on approach and engaged. His focus is on extended recovery program [facility].  He denies SI/HI/AVH. Denies having symptoms of withdrawal and none observed. Appetite is fair. Reports + BM's. He engages with peers appropriately without issue. Behavior is controlled. Will continue to monitor for changes and report. Safety maintained

## 2023-09-27 NOTE — ED Notes (Signed)
 Patient is sleeping. Respirations equal and unlabored, skin warm and dry. No change in assessment or acuity. Routine safety checks conducted according to facility protocol. Will continue to monitor for safety.

## 2023-09-27 NOTE — Group Note (Signed)
 Group Topic: Positive Affirmations  Group Date: 09/27/2023 Start Time: 1000 End Time: 1100 Facilitators: Milan Alfred, NT +3 MHT 2 Department: [facility]  Number of Participants: 2  Group Focus: feeling awareness/expression Treatment Modality:  Psychoeducation Interventions utilized were story telling Purpose: regain self-worth  Name: Xavier Ramsey Date of Birth: June 08, 1991  MR: [id number]    Level of Participation: active Quality of Participation: attentive Interactions with others: gave feedback Mood/Affect: agitated and positive Triggers (if applicable): N/A Cognition: coherent/clear Progress: Gaining insight Response: N/A Plan: patient will be encouraged to continue to be Positive and not to give up.  Patients Problems:  Patient Active Problem List   Diagnosis Date Noted   Trauma 08/03/2023   Epigastric pain 04/07/2023   Acute esophagitis 04/07/2023   Gastritis and gastroduodenitis 04/07/2023   Acute upper GI bleed 04/06/2023   Nausea & vomiting 04/06/2023   AKI (acute kidney injury) (HCC) 04/06/2023   Transaminitis 04/06/2023   Leukocytosis 04/06/2023   Chronic alcohol  abuse 04/06/2023   Tobacco abuse 04/06/2023   GIB (gastrointestinal bleeding) 04/06/2023   Alcohol  use disorder, severe, dependence (HCC) 01/17/2023   MDD (major depressive disorder), recurrent episode (HCC) 07/26/2022   Anxiety 07/26/2022   Postconcussive syndrome 07/26/2022   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

## 2023-09-27 NOTE — Group Note (Signed)
 Group Topic: Communication  Group Date: 09/27/2023 Start Time: 1910 End Time: 1940 Facilitators: Wendall Halls B  Department: [facility]  Number of Participants: 3 Group Focus: activities of daily living skills, check in, and individual meeting Treatment Modality:  Leisure Development Interventions utilized were leisure development and support Purpose: express feelings  Name: Xavier Ramsey Date of Birth: [birth year]  MR: [id number]    Level of Participation: active Quality of Participation: attentive and cooperative Interactions with others: gave feedback Mood/Affect: appropriate and brightens with interaction Triggers (if applicable): NA Cognition: coherent/clear Progress: Gaining insight Response: Ready to leave tomorrow.  Plan: patient will be encouraged to keep going to groups.   Patients Problems:  Patient Active Problem List   Diagnosis Date Noted   Trauma 08/03/2023   Epigastric pain 04/07/2023   Acute esophagitis 04/07/2023   Gastritis and gastroduodenitis 04/07/2023   Acute upper GI bleed 04/06/2023   Nausea & vomiting 04/06/2023   AKI (acute kidney injury) (HCC) 04/06/2023   Transaminitis 04/06/2023   Leukocytosis 04/06/2023   Chronic alcohol  abuse 04/06/2023   Tobacco abuse 04/06/2023   GIB (gastrointestinal bleeding) 04/06/2023   Alcohol  use disorder, severe, dependence (HCC) 01/17/2023   MDD (major depressive disorder), recurrent episode (HCC) 07/26/2022   Anxiety 07/26/2022   Postconcussive syndrome 07/26/2022   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

## 2023-09-27 NOTE — ED Notes (Signed)
 Xavier Ramsey reports "I'm doing well". His focus is on being discharged tomorrow and to continue his treatment at [facility].. All Discharged orders have been completed. He denies SI/HI/AVH and is currently observed sitting in dining room engaging appropriately with his peers. Mood is light and he offers no concerns. Will continue to monitor behavior and report changes as noted. Safety maintained

## 2023-09-27 NOTE — ED Notes (Signed)
Patient resting quietly in bed with eyes closed. Respirations equal and unlabored, skin warm and dry, NAD. No change in assessment or acuity. Q 15 minute safety checks remain in place.   

## 2023-09-28 NOTE — ED Notes (Signed)
Patient resting quietly in bed with eyes closed. Respirations equal and unlabored, skin warm and dry, NAD. No change in assessment or acuity. Q 15 minute safety checks remain in place.   

## 2023-09-28 NOTE — ED Notes (Signed)
Transport called and arranged, to be here shortly.

## 2023-09-28 NOTE — ED Notes (Signed)
Patient alert & oriented x4. Denies intent to harm self or others when asked. Denies A/VH. Patient denies any physical complaints when asked. No acute distress noted. Support and encouragement provided. Patient verbalized understanding of discharge plan later this AM, mood good regarding discharge. Routine safety checks conducted per facility protocol. Encouraged patient to notify staff if any thoughts of harm towards self or others arise. Patient verbalizes understanding and agreement.

## 2023-09-28 NOTE — Group Note (Signed)
Group Topic: Relapse and Recovery  Group Date: 09/28/2023 Start Time: 1027 End Time: 0847 Facilitators: Mayumi Summerson, Jacklynn Barnacle, RN  Department: [facility]  Number of Participants: 1  Group Focus: discharge education Treatment Modality:  Individual Therapy Interventions utilized were patient education Purpose: increase insight and provide d/c education  Name: Xavier Ramsey Date of Birth: 02-09-1991  MR: [id number]    Level of Participation: active Quality of Participation: attentive and cooperative Interactions with others: gave feedback Mood/Affect: appropriate Triggers (if applicable): none identified Cognition: coherent/clear, goal directed, and insightful Progress: Significant Response: Patient reports understanding of d/c eduction and reports fair mood regarding d/c Plan: patient will be encouraged to continue detox at [facility]  Patients Problems:  Patient Active Problem List   Diagnosis Date Noted   Trauma 08/03/2023   Epigastric pain 04/07/2023   Acute esophagitis 04/07/2023   Gastritis and gastroduodenitis 04/07/2023   Acute upper GI bleed 04/06/2023   Nausea & vomiting 04/06/2023   AKI (acute kidney injury) (HCC) 04/06/2023   Transaminitis 04/06/2023   Leukocytosis 04/06/2023   Chronic alcohol abuse 04/06/2023   Tobacco abuse 04/06/2023   GIB (gastrointestinal bleeding) 04/06/2023   Alcohol use disorder, severe, dependence (HCC) 01/17/2023   MDD (major depressive disorder), recurrent episode (HCC) 07/26/2022   Anxiety 07/26/2022   Postconcussive syndrome 07/26/2022   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

## 2023-09-28 NOTE — ED Notes (Signed)
 Patient sitting in bedroom, calm and collected. No acute distress noted. No concerns voiced. Informed patient to notify staff with any needs or assistance. Patient verbalized understanding or agreement. Safety checks in place per facility policy.

## 2023-09-28 NOTE — ED Provider Notes (Signed)
FBC/OBS ASAP Discharge Summary  Date and Time: 09/28/2023 7:51 AM  Name: Xavier Ramsey  MRN:  [id number]   Discharge Diagnoses:  Final diagnoses:  Severe episode of recurrent major depressive disorder, without psychotic features (HCC)  Alcohol abuse  Substance induced mood disorder (HCC)    Subjective:  Patient evaluated on the unit on the day of discharge.  He reports his mood is stable, reports adequate sleep and appetite.  On interview, suicidal ideations are not present. Thoughts of self harm are not present. Homicidal ideations are not present.   There are no auditory hallucinations, visual hallucinations, paranoid ideations, or delusional thought processes.   Side effects to currently prescribed medications are none. There are no somatic complaints. Reports regular bowel movements.. There are no withdrawals reported today.   Stay Summary:  Xavier Ramsey is a 33 year old male with reported psychiatric history of alcohol abuse, PTSD, and depression. He presented voluntarily to [facility] reporting depressive symptoms and requesting alcohol detox.   During the patient's hospitalization at the [facility], patient had extensive initial psychiatric evaluation, with daily follow-up assessments focused on detoxification management.  Psychiatric diagnoses provided upon initial assessment:  Severe episode of recurrent major depressive disorder, without psychotic features Alcohol abuse Substance-induced mood disorder  Patient's medications adjusted during hospitalization:  Patient was initially placed on CIWA protocol with completed Librium taper  Patient's care was discussed during the interdisciplinary team meeting every day during the hospitalization.  The patient denies having side effects to prescribed psychiatric medication.  Gradually, patient started adjusting to milieu. The patient was evaluated each day by a clinical provider to ascertain response to treatment. Improvement was  noted by the patient's report of decreasing symptoms, improved sleep and appetite, affect, medication tolerance, behavior, and participation in unit programming.  Patient was asked each day to complete a self inventory noting mood, mental status, pain, new symptoms, anxiety and concerns.    Symptoms were reported as significantly decreased or resolved completely by discharge.   On day of discharge, the patient reports that their mood is stable. The patient denied having suicidal thoughts for more than 48 hours prior to discharge.  Patient denies having homicidal thoughts.  Patient denies having auditory hallucinations.  Patient denies any visual hallucinations or other symptoms of psychosis. The patient was motivated to continue taking medication with a goal of continued improvement in mental health.   The patient reported that their withdrawal symptoms and cravings responded well to the detox regimen, with overall benefit from the detox program. Supportive psychotherapy was provided, and the patient participated in regular group therapy sessions focused on managing cravings and withdrawal. Coping skills, problem-solving, and relaxation techniques were also part of the program's therapeutic interventions.  Labs were reviewed with the patient, and abnormal results were discussed with the patient.  The patient is able to verbalize their individual safety plan to this provider.  # It is recommended to the patient to continue psychiatric medications as prescribed, after discharge from the hospital.    # It is recommended to the patient to follow up with your outpatient psychiatric provider and PCP.  # It was discussed with the patient, the impact of alcohol, drugs, tobacco have been there overall psychiatric and medical wellbeing, and total abstinence from substance use was recommended the patient.ed.  # Prescriptions provided or sent directly to preferred pharmacy at discharge. Patient agreeable to  plan. Given opportunity to ask questions. Appears to feel comfortable with discharge.    # In the event  of worsening symptoms, the patient is instructed to call the crisis hotline, 911 and or go to the nearest ED for appropriate evaluation and treatment of symptoms. To follow-up with primary care provider for other medical issues, concerns and or health care needs  # Patient was discharged home with a plan to follow up as noted below.     Total Time spent with patient: 15 minutes   Current Medications:  Current Facility-Administered Medications  Medication Dose Route Frequency Provider Last Rate Last Admin   acetaminophen (TYLENOL) tablet 650 mg  650 mg Oral Q6H PRN Ajibola, Ene A, NP   650 mg at 09/26/23 0934   alum & mag hydroxide-simeth (MAALOX/MYLANTA) 200-200-20 MG/5ML suspension 30 mL  30 mL Oral Q4H PRN Ajibola, Ene A, NP       magnesium hydroxide (MILK OF MAGNESIA) suspension 30 mL  30 mL Oral Daily PRN Ajibola, Ene A, NP       multivitamin with minerals tablet 1 tablet  1 tablet Oral Daily Ajibola, Ene A, NP   1 tablet at 09/27/23 0920   nicotine polacrilex (NICORETTE) gum 2 mg  2 mg Oral PRN Carrion-Carrero, Karle Starch, MD   2 mg at 09/27/23 1752   sertraline (ZOLOFT) tablet 25 mg  25 mg Oral Daily Ajibola, Ene A, NP   25 mg at 09/27/23 0920   thiamine (VITAMIN B1) tablet 100 mg  100 mg Oral Daily Ajibola, Ene A, NP   100 mg at 09/27/23 0920   traZODone (DESYREL) tablet 100 mg  100 mg Oral QHS PRN Lorri Frederick, MD   100 mg at 09/27/23 2108   Current Outpatient Medications  Medication Sig Dispense Refill   acetaminophen (TYLENOL) 325 MG tablet Take 2 tablets (650 mg total) by mouth every 6 (six) hours as needed for mild pain (pain score 1-3).     nicotine polacrilex (NICORETTE) 2 MG gum Take 1 each (2 mg total) by mouth as needed for smoking cessation. 100 tablet 0   sertraline (ZOLOFT) 25 MG tablet Take 1 tablet (25 mg total) by mouth daily. 30 tablet 0   traZODone  (DESYREL) 100 MG tablet Take 1 tablet (100 mg total) by mouth at bedtime as needed for sleep. 30 tablet 0    PTA Medications:  Facility Ordered Medications  Medication   acetaminophen (TYLENOL) tablet 650 mg   alum & mag hydroxide-simeth (MAALOX/MYLANTA) 200-200-20 MG/5ML suspension 30 mL   magnesium hydroxide (MILK OF MAGNESIA) suspension 30 mL   [COMPLETED] thiamine (VITAMIN B1) injection 100 mg   thiamine (VITAMIN B1) tablet 100 mg   multivitamin with minerals tablet 1 tablet   [EXPIRED] LORazepam (ATIVAN) tablet 1 mg   [EXPIRED] hydrOXYzine (ATARAX) tablet 25 mg   [EXPIRED] loperamide (IMODIUM) capsule 2-4 mg   [EXPIRED] ondansetron (ZOFRAN-ODT) disintegrating tablet 4 mg   sertraline (ZOLOFT) tablet 25 mg   [COMPLETED] chlordiazePOXIDE (LIBRIUM) capsule 25 mg   Followed by   [COMPLETED] chlordiazePOXIDE (LIBRIUM) capsule 25 mg   Followed by   [COMPLETED] chlordiazePOXIDE (LIBRIUM) capsule 25 mg   traZODone (DESYREL) tablet 100 mg   nicotine polacrilex (NICORETTE) gum 2 mg   PTA Medications  Medication Sig   traZODone (DESYREL) 100 MG tablet Take 1 tablet (100 mg total) by mouth at bedtime as needed for sleep.   acetaminophen (TYLENOL) 325 MG tablet Take 2 tablets (650 mg total) by mouth every 6 (six) hours as needed for mild pain (pain score 1-3).   nicotine polacrilex (NICORETTE) 2 MG gum  Take 1 each (2 mg total) by mouth as needed for smoking cessation.   sertraline (ZOLOFT) 25 MG tablet Take 1 tablet (25 mg total) by mouth daily.       01/22/2023   12:51 PM 01/19/2023    1:42 PM 01/17/2023   12:24 AM  Depression screen PHQ 2/9  Decreased Interest 3 1 2   Down, Depressed, Hopeless 1 1 1   PHQ - 2 Score 4 2 3   Altered sleeping 0 3 3  Tired, decreased energy 0 2 2  Change in appetite 0 1 3  Feeling bad or failure about yourself  1 1 1   Trouble concentrating 1 1 1   Moving slowly or fidgety/restless 0 1 1  Suicidal thoughts 0 0 0  PHQ-9 Score 6 11 14   Difficult doing  work/chores   Somewhat difficult    Flowsheet Row ED from 09/24/2023 in [facility] ED to Hosp-Admission (Discharged) from 08/03/2023 in [facility] from 05/10/2023 in [facility]  C-SSRS RISK CATEGORY No Risk No Risk No Risk       Musculoskeletal  Strength & Muscle Tone: within normal limits Gait & Station: normal Patient leans: N/A  Psychiatric Specialty Exam  Presentation  General Appearance:  Appropriate for Environment  Eye Contact: Good  Speech: Clear and Coherent; Normal Rate  Speech Volume: Normal  Handedness: Right   Mood and Affect  Mood: -- ("Good")  Affect: Congruent; Full Range   Thought Process  Thought Processes: Linear  Descriptions of Associations:Intact  Orientation:None  Thought Content:Logical  Diagnosis of Schizophrenia or Schizoaffective disorder in past: No    Hallucinations:Hallucinations: None  Ideas of Reference:None  Suicidal Thoughts:Suicidal Thoughts: No  Homicidal Thoughts:Homicidal Thoughts: No   Sensorium  Memory: Immediate Good; Recent Good; Remote Good  Judgment: Fair  Insight: Fair   Chartered certified accountant: Fair  Attention Span: Fair  Recall: [employer] of Knowledge: Fair  Language: Fair   Psychomotor Activity  Psychomotor Activity: Psychomotor Activity: Normal   Assets  Assets: Desire for Improvement; Resilience; Communication Skills   Sleep  Sleep: Sleep: Good   No data recorded  Physical Exam  Physical Exam Vitals and nursing note reviewed.  Constitutional:      General: He is not in acute distress.    Appearance: He is not ill-appearing.  HENT:     Head: Normocephalic and atraumatic.  Pulmonary:     Effort: Pulmonary effort is normal. No respiratory distress.  Skin:    General: Skin is warm and dry.  Neurological:     General: No focal deficit present.     ROS Blood pressure 125/86, pulse 75, temperature 98.3 F (36.8 C), temperature source Oral, resp. rate 18, SpO2 99%. There is no height or weight on file to calculate BMI.  Demographic Factors:  Male  Loss Factors: NA  Historical Factors: Impulsivity  Risk Reduction Factors:   Positive therapeutic relationship and Positive coping skills or problem solving skills  Continued Clinical Symptoms:  Alcohol/Substance Abuse/Dependencies  Cognitive Features That Contribute To Risk:  None    Suicide Risk:  Mild:  Suicidal ideation of limited frequency, intensity, duration, and specificity.  There are no identifiable plans, no associated intent, mild dysphoria and related symptoms, good self-control (both objective and subjective assessment), few other risk factors, and identifiable protective factors, including available and accessible social support.  Plan Of Care/Follow-up recommendations:  Activity: as tolerated  Diet: heart  healthy  Other: -Follow-up with your outpatient psychiatric provider -instructions on appointment date, time, and address (location) are provided to you in discharge paperwork.  -Take your psychiatric medications as prescribed at discharge - instructions are provided to you in the discharge paperwork  -Follow-up with outpatient primary care doctor and other specialists -for management of preventative medicine and chronic medical disease: None  -Testing: Follow-up with outpatient provider for abnormal lab results: None  -If you are prescribed an atypical antipsychotic medication, we recommend that your outpatient psychiatrist follow routine screening for side effects within 3 months of discharge, including monitoring: AIMS scale, height, weight, blood pressure, fasting lipid panel, HbA1c, and fasting blood sugar.   -Recommend total abstinence from alcohol, tobacco, and other illicit drug use at discharge.   -If your psychiatric symptoms recur, worsen, or  if you have side effects to your psychiatric medications, call your outpatient psychiatric provider, 911, [phone] or go to the nearest emergency department.  -If suicidal thoughts occur, immediately call your outpatient psychiatric provider, 911, [phone] or go to the nearest emergency department.   Disposition: [facility] for residential rehabilitation  Lorri Frederick, MD 09/28/2023, 7:51 AM

## 2023-09-28 NOTE — ED Notes (Signed)
Patient discharged to [facility] per MD order. After Visit Summary (AVS) printed and given to patient, as well as printed prescriptions. AVS reviewed with patient and all questions fully answered. Patient discharged in no acute distress, A& O x4 and ambulatory. Patient denied SI/HI, A/VH upon discharge. Patient verbalized understanding of all discharge instructions explained by staff, including follow up appointments, RX's and safety plan. Patient mood reported good.  Patient belongings returned to patient from locker #29 complete and intact. Patient escorted [place] via staff for transport to destination. Safety maintained.
